# Patient Record
Sex: Female | Born: 1935
Health system: Southern US, Community
[De-identification: ages and names within clinical notes are randomized; demographics above are authoritative.]

## PROBLEM LIST (undated history)

## (undated) DIAGNOSIS — I10 Essential (primary) hypertension: Secondary | ICD-10-CM

## (undated) DIAGNOSIS — Z87442 Personal history of urinary calculi: Secondary | ICD-10-CM

## (undated) DIAGNOSIS — E785 Hyperlipidemia, unspecified: Secondary | ICD-10-CM

## (undated) DIAGNOSIS — E039 Hypothyroidism, unspecified: Secondary | ICD-10-CM

## (undated) DIAGNOSIS — I82409 Acute embolism and thrombosis of unspecified deep veins of unspecified lower extremity: Secondary | ICD-10-CM

## (undated) DIAGNOSIS — M810 Age-related osteoporosis without current pathological fracture: Secondary | ICD-10-CM

## (undated) DIAGNOSIS — E049 Nontoxic goiter, unspecified: Secondary | ICD-10-CM

## (undated) DIAGNOSIS — M1990 Unspecified osteoarthritis, unspecified site: Secondary | ICD-10-CM

## (undated) HISTORY — DX: Acute embolism and thrombosis of unspecified deep veins of unspecified lower extremity: I82.409

## (undated) HISTORY — DX: Unspecified osteoarthritis, unspecified site: M19.90

## (undated) HISTORY — DX: Personal history of urinary calculi: Z87.442

## (undated) HISTORY — DX: Age-related osteoporosis without current pathological fracture: M81.0

## (undated) HISTORY — DX: Nontoxic goiter, unspecified: E04.9

## (undated) HISTORY — DX: Hyperlipidemia, unspecified: E78.5

## (undated) HISTORY — PX: HIP SURGERY: SHX245

## (undated) HISTORY — DX: Essential (primary) hypertension: I10

## (undated) HISTORY — DX: Hypothyroidism, unspecified: E03.9

---

## 1998-05-09 ENCOUNTER — Emergency Department (HOSPITAL_COMMUNITY): Admission: EM | Admit: 1998-05-09 | Discharge: 1998-05-09 | Payer: Self-pay | Admitting: Emergency Medicine

## 1998-12-06 ENCOUNTER — Ambulatory Visit (HOSPITAL_COMMUNITY): Admission: RE | Admit: 1998-12-06 | Discharge: 1998-12-06 | Payer: Self-pay | Admitting: Internal Medicine

## 1999-12-21 ENCOUNTER — Other Ambulatory Visit: Admission: RE | Admit: 1999-12-21 | Discharge: 1999-12-21 | Payer: Self-pay | Admitting: Family Medicine

## 1999-12-31 ENCOUNTER — Ambulatory Visit (HOSPITAL_COMMUNITY): Admission: RE | Admit: 1999-12-31 | Discharge: 1999-12-31 | Payer: Self-pay | Admitting: Family Medicine

## 1999-12-31 ENCOUNTER — Encounter: Payer: Self-pay | Admitting: Family Medicine

## 2001-05-03 ENCOUNTER — Ambulatory Visit (HOSPITAL_COMMUNITY): Admission: RE | Admit: 2001-05-03 | Discharge: 2001-05-03 | Payer: Self-pay | Admitting: Family Medicine

## 2001-05-03 ENCOUNTER — Encounter: Payer: Self-pay | Admitting: Family Medicine

## 2001-06-21 ENCOUNTER — Encounter: Admission: RE | Admit: 2001-06-21 | Discharge: 2001-09-19 | Payer: Self-pay | Admitting: Family Medicine

## 2001-06-22 ENCOUNTER — Encounter: Admission: RE | Admit: 2001-06-22 | Discharge: 2001-09-20 | Payer: Self-pay | Admitting: Family Medicine

## 2002-06-12 ENCOUNTER — Other Ambulatory Visit: Admission: RE | Admit: 2002-06-12 | Discharge: 2002-06-12 | Payer: Self-pay | Admitting: Family Medicine

## 2004-06-26 ENCOUNTER — Ambulatory Visit (HOSPITAL_COMMUNITY): Admission: RE | Admit: 2004-06-26 | Discharge: 2004-06-26 | Payer: Self-pay | Admitting: Family Medicine

## 2004-11-21 ENCOUNTER — Ambulatory Visit (HOSPITAL_COMMUNITY): Admission: RE | Admit: 2004-11-21 | Discharge: 2004-11-21 | Payer: Self-pay | Admitting: Nephrology

## 2005-05-17 ENCOUNTER — Encounter: Admission: RE | Admit: 2005-05-17 | Discharge: 2005-07-01 | Payer: Self-pay | Admitting: Nephrology

## 2012-05-25 LAB — BASIC METABOLIC PANEL
BUN: 26 mg/dL — AB (ref 4–21)
Creatinine: 1.1 mg/dL (ref 0.5–1.1)
Glucose: 93 mg/dL
Potassium: 5.1 mmol/L (ref 3.4–5.3)
Sodium: 143 mmol/L (ref 137–147)

## 2012-05-25 LAB — LIPID PANEL
Cholesterol: 149 mg/dL (ref 0–200)
HDL: 58 mg/dL (ref 35–70)
LDL Cholesterol: 79 mg/dL
Triglycerides: 59 mg/dL (ref 40–160)

## 2012-05-25 LAB — CBC AND DIFFERENTIAL
HCT: 40 % (ref 36–46)
Hemoglobin: 12.7 g/dL (ref 12.0–16.0)
Platelets: 223 10*3/uL (ref 150–399)
WBC: 7.2 10^3/mL

## 2012-05-25 LAB — HEPATIC FUNCTION PANEL
ALT: 12 U/L (ref 7–35)
AST: 18 U/L (ref 13–35)
Alkaline Phosphatase: 114 U/L (ref 25–125)
Bilirubin, Total: 1.2 mg/dL

## 2012-05-25 LAB — HEMOGLOBIN A1C: Hgb A1c MFr Bld: 5.7 % (ref 4.0–6.0)

## 2012-11-02 ENCOUNTER — Telehealth: Payer: Self-pay | Admitting: Internal Medicine

## 2012-11-02 NOTE — Telephone Encounter (Signed)
rec'd from Dr. Jeri Cos forward 20 pages to Dr. Felicity Coyer 11/22/12

## 2012-12-29 ENCOUNTER — Other Ambulatory Visit (INDEPENDENT_AMBULATORY_CARE_PROVIDER_SITE_OTHER): Payer: Medicare HMO

## 2012-12-29 ENCOUNTER — Encounter: Payer: Self-pay | Admitting: Internal Medicine

## 2012-12-29 ENCOUNTER — Ambulatory Visit (INDEPENDENT_AMBULATORY_CARE_PROVIDER_SITE_OTHER): Payer: Medicare HMO | Admitting: Internal Medicine

## 2012-12-29 VITALS — BP 142/70 | HR 71 | Temp 98.4°F | Ht 62.25 in | Wt 281.2 lb

## 2012-12-29 DIAGNOSIS — R5381 Other malaise: Secondary | ICD-10-CM

## 2012-12-29 DIAGNOSIS — M5416 Radiculopathy, lumbar region: Secondary | ICD-10-CM

## 2012-12-29 DIAGNOSIS — R5383 Other fatigue: Secondary | ICD-10-CM

## 2012-12-29 DIAGNOSIS — I10 Essential (primary) hypertension: Secondary | ICD-10-CM

## 2012-12-29 DIAGNOSIS — Z1239 Encounter for other screening for malignant neoplasm of breast: Secondary | ICD-10-CM

## 2012-12-29 DIAGNOSIS — G8929 Other chronic pain: Secondary | ICD-10-CM

## 2012-12-29 DIAGNOSIS — M1710 Unilateral primary osteoarthritis, unspecified knee: Secondary | ICD-10-CM

## 2012-12-29 DIAGNOSIS — M1711 Unilateral primary osteoarthritis, right knee: Secondary | ICD-10-CM

## 2012-12-29 DIAGNOSIS — E785 Hyperlipidemia, unspecified: Secondary | ICD-10-CM

## 2012-12-29 DIAGNOSIS — IMO0002 Reserved for concepts with insufficient information to code with codable children: Secondary | ICD-10-CM

## 2012-12-29 LAB — HEPATIC FUNCTION PANEL
ALT: 14 U/L (ref 0–35)
AST: 16 U/L (ref 0–37)
Alkaline Phosphatase: 95 U/L (ref 39–117)
Bilirubin, Direct: 0.3 mg/dL (ref 0.0–0.3)
Total Bilirubin: 1.3 mg/dL — ABNORMAL HIGH (ref 0.3–1.2)

## 2012-12-29 LAB — HM COLONOSCOPY

## 2012-12-29 LAB — CBC WITH DIFFERENTIAL/PLATELET
Basophils Absolute: 0 10*3/uL (ref 0.0–0.1)
Eosinophils Absolute: 0.3 10*3/uL (ref 0.0–0.7)
Eosinophils Relative: 4.5 % (ref 0.0–5.0)
HCT: 35.5 % — ABNORMAL LOW (ref 36.0–46.0)
Lymphs Abs: 1.6 10*3/uL (ref 0.7–4.0)
MCHC: 32.6 g/dL (ref 30.0–36.0)
MCV: 87.9 fl (ref 78.0–100.0)
Monocytes Absolute: 0.4 10*3/uL (ref 0.1–1.0)
Neutrophils Relative %: 61.5 % (ref 43.0–77.0)
Platelets: 155 10*3/uL (ref 150.0–400.0)
RDW: 15.3 % — ABNORMAL HIGH (ref 11.5–14.6)

## 2012-12-29 LAB — LIPID PANEL
Cholesterol: 140 mg/dL (ref 0–200)
LDL Cholesterol: 64 mg/dL (ref 0–99)
Total CHOL/HDL Ratio: 2
Triglycerides: 61 mg/dL (ref 0.0–149.0)

## 2012-12-29 LAB — TSH: TSH: 0.07 u[IU]/mL — ABNORMAL LOW (ref 0.35–5.50)

## 2012-12-29 LAB — BASIC METABOLIC PANEL
CO2: 25 mEq/L (ref 19–32)
Chloride: 106 mEq/L (ref 96–112)
Potassium: 4.1 mEq/L (ref 3.5–5.1)

## 2012-12-29 MED ORDER — METOPROLOL SUCCINATE ER 100 MG PO TB24: 100.0000 mg | ORAL_TABLET | Freq: Every day | ORAL | Status: DC

## 2012-12-29 NOTE — Patient Instructions (Signed)
It was good to see you today. We have reviewed your prior records including labs and tests today Test(s) ordered today. Your results will be released to MyChart (or called to you) after review, usually within 72hours after test completion. If any changes need to be made, you will be notified at that same time. Medications reviewed and updated, no changes recommended at this time. Refill on medication(s) as discussed today. we'll make referral to orthopedics for your knee and back pain symptoms. Also for mammogram screening. Our office will contact you regarding appointment(s) once made. Please schedule followup in 3-4 months, call sooner if problems.

## 2012-12-29 NOTE — Progress Notes (Signed)
Subjective:    Patient ID: Shawna Fuentes, female    DOB: [birth year], 79 y.o.   MRN: [id number]  HPI  New patient to me and our practice, here today to establish a primary care provider. Previously followed with Dr. Jeri Cos  Reviewed chronic medical issues Hypertension - out of medications for past month. Previously reports compliance with medications as prescribed. No chest pain, edema, headache or vision change. No history of CAD or stroke  Dyslipidemia. Prescribed and compliant with statin therapy. Denies complication related to prescribe therapy.  Osteoarthritis - takes Tylenol #3 in AM occ/prn for control of pain and anti-inflammatory twice a day. Currently denies joint swelling or acute flare -chronically impaired gait because of knee pain and "weakness" -uses a rolling walker at all times within the house and when out, uses wheelchair if available  Past Medical History  Diagnosis Date  . Arthritis   . Hyperlipidemia   . Hypertension   . History of kidney stones    Family History  Problem Relation Age of Onset  . Osteoarthritis Mother   . Hypertension Mother   . Hypertension Father    History  Substance Use Topics  . Smoking status: Never Smoker   . Smokeless tobacco: Not on file  . Alcohol Use: No    Review of Systems Constitutional: Negative for fever or weight change.  Respiratory: Negative for cough and shortness of breath.   Cardiovascular: Negative for chest pain or palpitations.  Gastrointestinal: Negative for abdominal pain, no bowel changes.  Musculoskeletal: Negative for joint swelling. see HPI Skin: Negative for rash.  Neurological: Negative for dizziness or headache.  No other specific complaints in a complete review of systems (except as listed in HPI above).     Objective:   Physical Exam BP 142/70  Pulse 71  Temp(Src) 98.4 F (36.9 C) (Oral)  Ht 5' 2.25" (1.581 m)  Wt 281 lb 3.2 oz (127.551 kg)  BMI 51.03 kg/m2  SpO2 97% Wt Readings  from Last 3 Encounters:  12/29/12 281 lb 3.2 oz (127.551 kg)   Constitutional: She is obese, sitting in WC; appears well-developed and well-nourished. No distress. Dtr at side HENT: Head: Normocephalic and atraumatic. Ears: B TMs ok, no erythema or effusion; Nose: Nose normal. Mouth/Throat: Oropharynx is clear and moist. No oropharyngeal exudate.  Eyes: Conjunctivae and EOM are normal. Pupils are equal, round, and reactive to light. No scleral icterus.  Neck: Thick, Normal range of motion. Neck supple. No JVD present. No thyromegaly present.  Cardiovascular: Normal rate, regular rhythm and normal heart sounds.  No murmur heard. fatty BLE, but no BLE edema. Pulmonary/Chest: Effort normal and breath sounds normal. No respiratory distress. She has no wheezes.  Abdominal: Soft. Bowel sounds are normal. She exhibits no distension. There is no tenderness. no masses Musculoskeletal: R knee - boggy synovitis - tender to palpation over joint line; FROM and ligamentous function intact. Back: full range of motion of thoracic and lumbar spine. Non tender to palpation. Negative straight leg raise. DTR's are symmetrically intact. Sensation intact in all dermatomes of the lower extremities. Full strength to manual muscle testing. patient is not able to heel toe walk due to favoring RLE and ambulates with antalgic gait, holding wall/exam table for support (RW in waiting room). Neurological: She is alert and oriented to person, place, and time. No cranial nerve deficit. Coordination, and speech are normal. balance/gait impaired because of orthopedic issues, see above Skin: Skin is warm and dry. No rash noted. No  erythema.  Psychiatric: She has a normal mood and affect. Her behavior is normal. Judgment and thought content normal.   Lab Results  Component Value Date   WBC 7.2 05/25/2012   HGB 12.7 05/25/2012   HCT 40 05/25/2012   PLT 223 05/25/2012   CHOL 149 05/25/2012   TRIG 59 05/25/2012   HDL 58 05/25/2012    LDLCALC 79 05/25/2012   ALT 12 05/25/2012   AST 18 05/25/2012   NA 143 05/25/2012   K 5.1 05/25/2012   CREATININE 1.1 05/25/2012   BUN 26* 05/25/2012   HGBA1C 5.7 05/25/2012       Assessment & Plan:   See problem list. Medications and labs reviewed today.  Fatigue - nonspecific symptoms/exam - check screening labs  Time spent with pt/family today 45 minutes, greater than 50% time spent counseling patient on hypertension, lipids, osteoarthritis and medication review. Also review of prior records and ROI request  Refer for mammo, declines colo screening

## 2012-12-30 ENCOUNTER — Encounter: Payer: Self-pay | Admitting: Internal Medicine

## 2012-12-30 DIAGNOSIS — M1711 Unilateral primary osteoarthritis, right knee: Secondary | ICD-10-CM | POA: Insufficient documentation

## 2012-12-30 DIAGNOSIS — G8929 Other chronic pain: Secondary | ICD-10-CM | POA: Insufficient documentation

## 2012-12-30 NOTE — Assessment & Plan Note (Signed)
Reports remote rheumatologic and orthopedic evaluation for same Previously advised on need for total joint replacement, placed on hold until weight loss could be achieved Given progressive symptoms and impaired gait because of same, refer back to orthopedics for an evaluation (last seen >8years ago) Continue scheduled anti-inflammatory and use of Tylenol No. 3 when needed (#30 rx'd 05/2012 without refill since)

## 2012-12-30 NOTE — Assessment & Plan Note (Signed)
BP Readings from Last 3 Encounters:  12/29/12 142/70   Reports generally controlled when compliant with medications as prescribed Refill on beta blocker, continue ARB and diuretic today

## 2012-12-30 NOTE — Assessment & Plan Note (Signed)
Reports prior back pain, improved in past 2-3 years Gait would suggest chronic neurogenic claudication vs OA Referral to orthopedics for evaluation of same Continue scheduled anti-inflammatory with when necessary Tylenol #3

## 2012-12-30 NOTE — Assessment & Plan Note (Signed)
On statin Check annually, titrate as needed 

## 2013-01-29 ENCOUNTER — Telehealth: Payer: Self-pay | Admitting: *Deleted

## 2013-01-29 DIAGNOSIS — G8929 Other chronic pain: Secondary | ICD-10-CM

## 2013-01-29 DIAGNOSIS — IMO0002 Reserved for concepts with insufficient information to code with codable children: Secondary | ICD-10-CM

## 2013-01-29 DIAGNOSIS — M1710 Unilateral primary osteoarthritis, unspecified knee: Secondary | ICD-10-CM

## 2013-01-29 DIAGNOSIS — M1711 Unilateral primary osteoarthritis, right knee: Secondary | ICD-10-CM

## 2013-01-29 DIAGNOSIS — M5416 Radiculopathy, lumbar region: Secondary | ICD-10-CM

## 2013-01-29 NOTE — Telephone Encounter (Signed)
Pt called requesting a Rx for Wheel chair.  Pt states she is leaving for a funeral on Thursday and would like a wheelchair to help with mobility.  Please advise

## 2013-01-29 NOTE — Telephone Encounter (Signed)
Ok to generate rx and i will sign - ICD9 dx codes: 724.4, 338.29, 715.96

## 2013-01-30 NOTE — Telephone Encounter (Signed)
Spoke with pt. Advised Rx ready

## 2013-04-02 ENCOUNTER — Ambulatory Visit: Payer: Medicare HMO | Admitting: Internal Medicine

## 2013-04-13 ENCOUNTER — Ambulatory Visit: Payer: Medicare HMO | Admitting: Internal Medicine

## 2013-04-20 ENCOUNTER — Ambulatory Visit (INDEPENDENT_AMBULATORY_CARE_PROVIDER_SITE_OTHER): Payer: Medicare HMO | Admitting: Internal Medicine

## 2013-04-20 ENCOUNTER — Other Ambulatory Visit (INDEPENDENT_AMBULATORY_CARE_PROVIDER_SITE_OTHER): Payer: Medicare HMO

## 2013-04-20 ENCOUNTER — Encounter: Payer: Self-pay | Admitting: Internal Medicine

## 2013-04-20 VITALS — BP 134/72 | HR 68 | Temp 98.0°F

## 2013-04-20 DIAGNOSIS — I10 Essential (primary) hypertension: Secondary | ICD-10-CM

## 2013-04-20 DIAGNOSIS — Z23 Encounter for immunization: Secondary | ICD-10-CM

## 2013-04-20 DIAGNOSIS — M1710 Unilateral primary osteoarthritis, unspecified knee: Secondary | ICD-10-CM

## 2013-04-20 DIAGNOSIS — R6889 Other general symptoms and signs: Secondary | ICD-10-CM

## 2013-04-20 DIAGNOSIS — E039 Hypothyroidism, unspecified: Secondary | ICD-10-CM | POA: Insufficient documentation

## 2013-04-20 DIAGNOSIS — M1711 Unilateral primary osteoarthritis, right knee: Secondary | ICD-10-CM

## 2013-04-20 DIAGNOSIS — R7989 Other specified abnormal findings of blood chemistry: Secondary | ICD-10-CM

## 2013-04-20 LAB — TSH: TSH: 0.07 u[IU]/mL — ABNORMAL LOW (ref 0.35–5.50)

## 2013-04-20 LAB — T3: T3, Total: 100.6 ng/dL (ref 80.0–204.0)

## 2013-04-20 MED ORDER — ACETAMINOPHEN-CODEINE #2 300-15 MG PO TABS: 1.0000 | ORAL_TABLET | Freq: Four times a day (QID) | ORAL | Status: DC | PRN

## 2013-04-20 NOTE — Assessment & Plan Note (Signed)
BP Readings from Last 3 Encounters:  04/20/13 152/82  12/29/12 142/70   Reports generally controlled when compliant with medications as prescribed The current medical regimen is effective;  continue present plan and medications.

## 2013-04-20 NOTE — Assessment & Plan Note (Signed)
No symptoms of hyperthyroidism, but recheck TFTs today Consider referral to Endo if remains abnormal

## 2013-04-20 NOTE — Progress Notes (Signed)
Pre-visit discussion using our clinic review tool. No additional management support is needed unless otherwise documented below in the visit note.  

## 2013-04-20 NOTE — Progress Notes (Signed)
  Subjective:    Patient ID: Shawna Fuentes, female    DOB: Feb 04, 1933, 79 y.o.   MRN: [id number]  HPI  Here for follow up - reviewed chronic medical issues  Hypertension -reports compliance with medications as prescribed. No chest pain, edema, headache or vision change. No history of CAD or stroke  Dyslipidemia. Prescribed and compliant with statin therapy. Denies complication related to prescribed therapy.  Osteoarthritis - takes Tylenol #3 in AM occ/prn for control of pain (but causes sedation and constipation) - also anti-inflammatory twice a day. Currently denies joint swelling or acute flare -chronically impaired gait because of knee pain and "weakness" -uses a rolling walker at all times within the house and when out, uses wheelchair if available  Past Medical History  Diagnosis Date  . Arthritis   . Hyperlipidemia   . Hypertension   . History of kidney stones     Review of Systems  Respiratory: Negative for cough and shortness of breath.   Cardiovascular: Negative for chest pain and leg swelling.  Musculoskeletal: Positive for gait problem and joint swelling.        Objective:   Physical Exam BP 152/82  Pulse 68  Temp(Src) 98 F (36.7 C) (Oral)  SpO2 96% Wt Readings from Last 3 Encounters:  12/29/12 281 lb 3.2 oz (127.551 kg)   Constitutional: She is obese, sitting in WC; appears well-developed and well-nourished. No distress. Son at side Neck: Thick, Normal range of motion. Neck supple. No JVD present. No thyromegaly present.  Cardiovascular: Normal rate, regular rhythm and normal heart sounds.  No murmur heard. fatty BLE, but no BLE edema. Pulmonary/Chest: Effort normal and breath sounds normal. No respiratory distress. She has no wheezes. Musculoskeletal: R knee - boggy synovitis - tender to palpation over joint line; FROM and ligamentous function intact.  Neurological: She is alert and oriented to person, place, and time. No cranial nerve deficit. Coordination,  and speech are normal. balance/gait not tested today Skin: Skin is warm and dry. No rash noted. No erythema.  Psychiatric: She has a normal mood and affect. Her behavior is normal. Judgment and thought content normal.   Lab Results  Component Value Date   WBC 6.1 12/29/2012   HGB 11.6* 12/29/2012   HCT 35.5* 12/29/2012   PLT 155.0 12/29/2012   GLUCOSE 100* 12/29/2012   CHOL 140 12/29/2012   TRIG 61.0 12/29/2012   HDL 63.80 12/29/2012   LDLCALC 64 12/29/2012   ALT 14 12/29/2012   AST 16 12/29/2012   NA 141 12/29/2012   K 4.1 12/29/2012   CL 106 12/29/2012   CREATININE 0.8 12/29/2012   BUN 21 12/29/2012   CO2 25 12/29/2012   TSH 0.07* 12/29/2012   HGBA1C 5.7 05/25/2012       Assessment & Plan:   See problem list. Medications and labs reviewed today.

## 2013-04-20 NOTE — Patient Instructions (Addendum)
It was good to see you today.  Your annual flu shot was given and/or updated today.  We have reviewed your prior records including labs and tests today  Test(s) ordered today. Your results will be released to MyChart (or called to you) after review, usually within 72hours after test completion. If any changes need to be made, you will be notified at that same time.  Medications reviewed and updated Change Tylenol #3 to Tylenol #2 for pain as needed - No other changes recommended at this time  Your prescription(s) have been given to you to submit to your pharmacy. Please take as directed and contact our office if you believe you are having problem(s) with the medication(s).  Please schedule followup in 6 months, call sooner if problems.

## 2013-04-20 NOTE — Assessment & Plan Note (Addendum)
Reports remote rheumatologic and orthopedic evaluation for same Did not keep ortho appt 12/2012 as referred Previously advised on need for total joint replacement, but placed on hold until weight loss achieved Continue scheduled anti-inflammatory and use of Tylenol with codiene when needed  Will try Tylenol #2 in place of #3 to reduce codiene side effects of sedation and constipation

## 2013-06-23 ENCOUNTER — Other Ambulatory Visit: Payer: Self-pay | Admitting: Internal Medicine

## 2013-08-03 ENCOUNTER — Other Ambulatory Visit: Payer: Self-pay | Admitting: Internal Medicine

## 2013-08-11 ENCOUNTER — Other Ambulatory Visit: Payer: Self-pay | Admitting: Internal Medicine

## 2013-08-31 ENCOUNTER — Other Ambulatory Visit: Payer: Self-pay | Admitting: Internal Medicine

## 2013-09-13 ENCOUNTER — Other Ambulatory Visit: Payer: Self-pay | Admitting: Internal Medicine

## 2013-09-17 ENCOUNTER — Other Ambulatory Visit (INDEPENDENT_AMBULATORY_CARE_PROVIDER_SITE_OTHER): Payer: Medicare HMO

## 2013-09-17 ENCOUNTER — Ambulatory Visit (INDEPENDENT_AMBULATORY_CARE_PROVIDER_SITE_OTHER): Payer: Medicaid Other | Admitting: Internal Medicine

## 2013-09-17 ENCOUNTER — Encounter: Payer: Self-pay | Admitting: Internal Medicine

## 2013-09-17 VITALS — BP 140/60 | HR 74 | Temp 98.4°F | Resp 15

## 2013-09-17 DIAGNOSIS — R2689 Other abnormalities of gait and mobility: Secondary | ICD-10-CM

## 2013-09-17 DIAGNOSIS — M533 Sacrococcygeal disorders, not elsewhere classified: Secondary | ICD-10-CM

## 2013-09-17 DIAGNOSIS — R269 Unspecified abnormalities of gait and mobility: Secondary | ICD-10-CM

## 2013-09-17 DIAGNOSIS — M25552 Pain in left hip: Secondary | ICD-10-CM

## 2013-09-17 DIAGNOSIS — M545 Low back pain, unspecified: Secondary | ICD-10-CM

## 2013-09-17 DIAGNOSIS — M25551 Pain in right hip: Secondary | ICD-10-CM

## 2013-09-17 DIAGNOSIS — IMO0001 Reserved for inherently not codable concepts without codable children: Secondary | ICD-10-CM

## 2013-09-17 DIAGNOSIS — M25559 Pain in unspecified hip: Secondary | ICD-10-CM

## 2013-09-17 LAB — SEDIMENTATION RATE: Sed Rate: 57 mm/hr — ABNORMAL HIGH (ref 0–22)

## 2013-09-17 LAB — CK: CK TOTAL: 198 U/L — AB (ref 7–177)

## 2013-09-17 MED ORDER — TRAMADOL HCL 50 MG PO TABS: ORAL_TABLET | ORAL | Status: DC

## 2013-09-17 NOTE — Addendum Note (Signed)
Addended by: Harl Bowie on: 09/17/2013 02:16 PM   Modules accepted: Orders

## 2013-09-17 NOTE — Progress Notes (Signed)
   Subjective:    Patient ID: Shawna Fuentes, female    DOB: [birth year], 80 y.o.   MRN: [id number]  HPI  Her symptoms began 2 weeks ago in the lumbosacral area as well as the hips. She describes a sensation of pins & needles in the  lumbosacral area and heaviness in her legs which impairs ambulation. The symptoms can last minutes.  She has used Tylenol #2 but felt this made her sleepy. She states that Tylenol #3 did not  She is also on Clinoril generic twice a day.  She has no history of injury, surgery, or injections to lumbosacral spine  She's had some chills. She has edema of the ankles. She's also had some myalgias. She is on statin.    Review of Systems  She specifically denies fever, sweats, or Weight loss.  She has no incontinence of urine or stool  The swelling is in the ankles not in the joints.    Objective:   Physical Exam  General appearance i: morbidly obese; in W/C; w/o distress.  Eyes: No conjunctival inflammation or scleral icterus is present.  Oral exam: Dentures; lips and gums are healthy appearing.There is no oropharyngeal erythema or exudate noted.   Heart:  Normal rate and regular rhythm. S1 and S2 normal without gallop, murmur, click, rub or other extra sounds     Lungs:Chest :slight rales@ basest.No increased work of breathing.   Slight tenderness over the LS area to percussion  Musculoskeletal: Able to lie flat and sit up without help. Negative straight leg raising bilaterally; no pain with hip ROM. Crepitus R > L.  Skin:Warm & dry.  Intact without suspicious lesions or rashes                Assessment & Plan:  #1 LS & hip pain See orders

## 2013-09-17 NOTE — Patient Instructions (Addendum)
  Your next office appointment will be determined based upon review of your pending labs . Those instructions will be transmitted to you   by mail. Followup as needed for your acute issue. Please report any significant change in your symptoms.The Physical Therapy referral will be scheduled and you'll be notified of the time.

## 2013-09-17 NOTE — Progress Notes (Signed)
Pre visit review using our clinic review tool, if applicable. No additional management support is needed unless otherwise documented below in the visit note. 

## 2013-09-18 ENCOUNTER — Encounter: Payer: Self-pay | Admitting: *Deleted

## 2013-09-22 ENCOUNTER — Emergency Department (HOSPITAL_COMMUNITY): Payer: Medicare HMO

## 2013-09-22 ENCOUNTER — Encounter (HOSPITAL_COMMUNITY)
Admission: EM | Disposition: A | Discharge status: Skilled Nursing Facility | Payer: Self-pay | Source: Home / Self Care | Attending: Orthopedic Surgery

## 2013-09-22 ENCOUNTER — Encounter (HOSPITAL_COMMUNITY): Payer: Medicare HMO | Admitting: Anesthesiology

## 2013-09-22 ENCOUNTER — Inpatient Hospital Stay (HOSPITAL_COMMUNITY)
Admission: EM | Admit: 2013-09-22 | Discharge: 2013-09-25 | DRG: 481 | Disposition: A | Discharge status: Skilled Nursing Facility | Payer: Medicare HMO | Attending: Orthopedic Surgery | Admitting: Orthopedic Surgery

## 2013-09-22 ENCOUNTER — Emergency Department (HOSPITAL_COMMUNITY): Payer: Medicare HMO | Admitting: Anesthesiology

## 2013-09-22 ENCOUNTER — Encounter (HOSPITAL_COMMUNITY): Payer: Self-pay | Admitting: Emergency Medicine

## 2013-09-22 DIAGNOSIS — R262 Difficulty in walking, not elsewhere classified: Secondary | ICD-10-CM | POA: Diagnosis present

## 2013-09-22 DIAGNOSIS — M1711 Unilateral primary osteoarthritis, right knee: Secondary | ICD-10-CM | POA: Diagnosis present

## 2013-09-22 DIAGNOSIS — Z6841 Body Mass Index (BMI) 40.0 and over, adult: Secondary | ICD-10-CM

## 2013-09-22 DIAGNOSIS — G8929 Other chronic pain: Secondary | ICD-10-CM | POA: Diagnosis present

## 2013-09-22 DIAGNOSIS — I10 Essential (primary) hypertension: Secondary | ICD-10-CM | POA: Diagnosis present

## 2013-09-22 DIAGNOSIS — R5381 Other malaise: Secondary | ICD-10-CM | POA: Diagnosis present

## 2013-09-22 DIAGNOSIS — S7223XA Displaced subtrochanteric fracture of unspecified femur, initial encounter for closed fracture: Secondary | ICD-10-CM | POA: Diagnosis present

## 2013-09-22 DIAGNOSIS — M5416 Radiculopathy, lumbar region: Secondary | ICD-10-CM

## 2013-09-22 DIAGNOSIS — M1710 Unilateral primary osteoarthritis, unspecified knee: Secondary | ICD-10-CM | POA: Diagnosis present

## 2013-09-22 DIAGNOSIS — R5383 Other fatigue: Secondary | ICD-10-CM | POA: Diagnosis present

## 2013-09-22 DIAGNOSIS — D62 Acute posthemorrhagic anemia: Secondary | ICD-10-CM

## 2013-09-22 DIAGNOSIS — W19XXXA Unspecified fall, initial encounter: Secondary | ICD-10-CM | POA: Diagnosis present

## 2013-09-22 DIAGNOSIS — S7290XA Unspecified fracture of unspecified femur, initial encounter for closed fracture: Secondary | ICD-10-CM | POA: Diagnosis present

## 2013-09-22 DIAGNOSIS — S7291XA Unspecified fracture of right femur, initial encounter for closed fracture: Secondary | ICD-10-CM

## 2013-09-22 DIAGNOSIS — N289 Disorder of kidney and ureter, unspecified: Secondary | ICD-10-CM | POA: Diagnosis present

## 2013-09-22 DIAGNOSIS — Z79899 Other long term (current) drug therapy: Secondary | ICD-10-CM

## 2013-09-22 DIAGNOSIS — E785 Hyperlipidemia, unspecified: Secondary | ICD-10-CM | POA: Diagnosis present

## 2013-09-22 DIAGNOSIS — S7221XA Displaced subtrochanteric fracture of right femur, initial encounter for closed fracture: Secondary | ICD-10-CM | POA: Diagnosis present

## 2013-09-22 HISTORY — PX: FEMUR IM NAIL: SHX1597

## 2013-09-22 LAB — URINALYSIS, ROUTINE W REFLEX MICROSCOPIC
BILIRUBIN URINE: NEGATIVE
Glucose, UA: NEGATIVE mg/dL
HGB URINE DIPSTICK: NEGATIVE
KETONES UR: NEGATIVE mg/dL
Nitrite: NEGATIVE
PROTEIN: NEGATIVE mg/dL
Specific Gravity, Urine: 1.021 (ref 1.005–1.030)
Urobilinogen, UA: 1 mg/dL (ref 0.0–1.0)
pH: 5 (ref 5.0–8.0)

## 2013-09-22 LAB — CBC WITH DIFFERENTIAL/PLATELET
Basophils Absolute: 0 10*3/uL (ref 0.0–0.1)
Basophils Relative: 0 % (ref 0–1)
Eosinophils Absolute: 0.1 10*3/uL (ref 0.0–0.7)
Eosinophils Relative: 1 % (ref 0–5)
HEMATOCRIT: 33.6 % — AB (ref 36.0–46.0)
Hemoglobin: 10.8 g/dL — ABNORMAL LOW (ref 12.0–15.0)
LYMPHS ABS: 1.4 10*3/uL (ref 0.7–4.0)
LYMPHS PCT: 14 % (ref 12–46)
MCH: 28.1 pg (ref 26.0–34.0)
MCHC: 32.1 g/dL (ref 30.0–36.0)
MCV: 87.3 fL (ref 78.0–100.0)
MONO ABS: 0.5 10*3/uL (ref 0.1–1.0)
Monocytes Relative: 5 % (ref 3–12)
Neutro Abs: 8 10*3/uL — ABNORMAL HIGH (ref 1.7–7.7)
Neutrophils Relative %: 80 % — ABNORMAL HIGH (ref 43–77)
PLATELETS: 180 10*3/uL (ref 150–400)
RBC: 3.85 MIL/uL — AB (ref 3.87–5.11)
RDW: 15 % (ref 11.5–15.5)
WBC: 10 10*3/uL (ref 4.0–10.5)

## 2013-09-22 LAB — PREPARE RBC (CROSSMATCH)

## 2013-09-22 LAB — GLUCOSE, CAPILLARY
Glucose-Capillary: 146 mg/dL — ABNORMAL HIGH (ref 70–99)
Glucose-Capillary: 119 mg/dL — ABNORMAL HIGH (ref 70–99)

## 2013-09-22 LAB — BASIC METABOLIC PANEL
BUN: 35 mg/dL — ABNORMAL HIGH (ref 6–23)
CO2: 26 meq/L (ref 19–32)
Calcium: 9.8 mg/dL (ref 8.4–10.5)
Chloride: 102 mEq/L (ref 96–112)
Creatinine, Ser: 0.86 mg/dL (ref 0.50–1.10)
GFR calc Af Amer: 73 mL/min — ABNORMAL LOW (ref >90)
GFR calc non Af Amer: 63 mL/min — ABNORMAL LOW (ref >90)
GLUCOSE: 112 mg/dL — AB (ref 70–99)
Potassium: 4.2 mEq/L (ref 3.7–5.3)
SODIUM: 142 meq/L (ref 137–147)

## 2013-09-22 LAB — POCT I-STAT 4, (NA,K, GLUC, HGB,HCT)
GLUCOSE: 157 mg/dL — AB (ref 70–99)
Glucose, Bld: 175 mg/dL — ABNORMAL HIGH (ref 70–99)
HCT: 19 % — ABNORMAL LOW (ref 36.0–46.0)
HEMATOCRIT: 26 % — AB (ref 36.0–46.0)
HEMOGLOBIN: 8.8 g/dL — AB (ref 12.0–15.0)
Hemoglobin: 6.5 g/dL — CL (ref 12.0–15.0)
POTASSIUM: 3.7 meq/L (ref 3.7–5.3)
Potassium: 3.9 mEq/L (ref 3.7–5.3)
Sodium: 139 mEq/L (ref 137–147)
Sodium: 139 mEq/L (ref 137–147)

## 2013-09-22 LAB — URINE MICROSCOPIC-ADD ON

## 2013-09-22 LAB — ABO/RH: ABO/RH(D): B POS

## 2013-09-22 SURGERY — INSERTION, INTRAMEDULLARY ROD, FEMUR
Anesthesia: General | Laterality: Right

## 2013-09-22 MED ORDER — ACETAMINOPHEN 500 MG PO TABS
1000.0000 mg | ORAL_TABLET | Freq: Four times a day (QID) | ORAL | Status: AC
  Administered 2013-09-23 (×3): 1000 mg via ORAL
  Filled 2013-09-22 (×4): qty 2

## 2013-09-22 MED ORDER — ATORVASTATIN CALCIUM 10 MG PO TABS
10.0000 mg | ORAL_TABLET | Freq: Every evening | ORAL | Status: DC
  Administered 2013-09-22 – 2013-09-24 (×3): 10 mg via ORAL
  Filled 2013-09-22 (×4): qty 1

## 2013-09-22 MED ORDER — ONDANSETRON HCL 4 MG PO TABS
4.0000 mg | ORAL_TABLET | Freq: Four times a day (QID) | ORAL | Status: DC | PRN
  Filled 2013-09-22: qty 1

## 2013-09-22 MED ORDER — IRBESARTAN 150 MG PO TABS
150.0000 mg | ORAL_TABLET | Freq: Every day | ORAL | Status: DC
  Administered 2013-09-23: 150 mg via ORAL
  Filled 2013-09-22 (×3): qty 1

## 2013-09-22 MED ORDER — ALBUMIN HUMAN 5 % IV SOLN
INTRAVENOUS | Status: DC | PRN
  Administered 2013-09-22 (×2): via INTRAVENOUS

## 2013-09-22 MED ORDER — SUCCINYLCHOLINE CHLORIDE 20 MG/ML IJ SOLN
INTRAMUSCULAR | Status: DC | PRN
  Administered 2013-09-22: 100 mg via INTRAVENOUS

## 2013-09-22 MED ORDER — HYDROCHLOROTHIAZIDE 25 MG PO TABS
25.0000 mg | ORAL_TABLET | Freq: Every day | ORAL | Status: DC
  Administered 2013-09-23: 25 mg via ORAL
  Filled 2013-09-22 (×3): qty 1

## 2013-09-22 MED ORDER — GLYCOPYRROLATE 0.2 MG/ML IJ SOLN
INTRAMUSCULAR | Status: AC
  Filled 2013-09-22: qty 2

## 2013-09-22 MED ORDER — PROPRANOLOL HCL 1 MG/ML IV SOLN
INTRAVENOUS | Status: DC | PRN
  Administered 2013-09-22: .25 mg via INTRAVENOUS

## 2013-09-22 MED ORDER — ONDANSETRON HCL 4 MG/2ML IJ SOLN: 4.0000 mg | Freq: Four times a day (QID) | INTRAMUSCULAR | Status: DC | PRN

## 2013-09-22 MED ORDER — SENNA 8.6 MG PO TABS
2.0000 | ORAL_TABLET | Freq: Two times a day (BID) | ORAL | Status: DC
  Administered 2013-09-22 – 2013-09-25 (×6): 17.2 mg via ORAL
  Filled 2013-09-22 (×7): qty 2

## 2013-09-22 MED ORDER — LIDOCAINE HCL 4 % MT SOLN
OROMUCOSAL | Status: DC | PRN
  Administered 2013-09-22: 5 mL via TOPICAL

## 2013-09-22 MED ORDER — PHENOL 1.4 % MT LIQD: 1.0000 | OROMUCOSAL | Status: DC | PRN

## 2013-09-22 MED ORDER — METHOCARBAMOL 500 MG PO TABS
ORAL_TABLET | ORAL | Status: AC
  Administered 2013-09-22: 500 mg via ORAL
  Filled 2013-09-22: qty 1

## 2013-09-22 MED ORDER — CALCIUM CHLORIDE 10 % IV SOLN
INTRAVENOUS | Status: DC | PRN
  Administered 2013-09-22: 200 mg via INTRAVENOUS
  Administered 2013-09-22: 100 mg via INTRAVENOUS
  Administered 2013-09-22: 400 mg via INTRAVENOUS
  Administered 2013-09-22 (×5): 100 mg via INTRAVENOUS
  Administered 2013-09-22: 200 mg via INTRAVENOUS

## 2013-09-22 MED ORDER — FERROUS SULFATE 325 (65 FE) MG PO TABS
325.0000 mg | ORAL_TABLET | Freq: Three times a day (TID) | ORAL | Status: DC
  Administered 2013-09-23 – 2013-09-25 (×6): 325 mg via ORAL
  Filled 2013-09-22 (×10): qty 1

## 2013-09-22 MED ORDER — FENTANYL CITRATE 0.05 MG/ML IJ SOLN
INTRAMUSCULAR | Status: AC
  Filled 2013-09-22: qty 5

## 2013-09-22 MED ORDER — VECURONIUM BROMIDE 10 MG IV SOLR
INTRAVENOUS | Status: DC | PRN
  Administered 2013-09-22: 5 mg via INTRAVENOUS
  Administered 2013-09-22: 3 mg via INTRAVENOUS

## 2013-09-22 MED ORDER — LACTATED RINGERS IV SOLN
INTRAVENOUS | Status: DC | PRN
  Administered 2013-09-22 (×4): via INTRAVENOUS

## 2013-09-22 MED ORDER — FENTANYL CITRATE 0.05 MG/ML IJ SOLN
INTRAMUSCULAR | Status: DC | PRN
  Administered 2013-09-22: 100 ug via INTRAVENOUS
  Administered 2013-09-22: 50 ug via INTRAVENOUS
  Administered 2013-09-22 (×4): 25 ug via INTRAVENOUS
  Administered 2013-09-22: 150 ug via INTRAVENOUS
  Administered 2013-09-22: 25 ug via INTRAVENOUS
  Administered 2013-09-22: 75 ug via INTRAVENOUS

## 2013-09-22 MED ORDER — MAGNESIUM CITRATE PO SOLN: 1.0000 | Freq: Once | ORAL | Status: AC | PRN

## 2013-09-22 MED ORDER — INSULIN ASPART 100 UNIT/ML ~~LOC~~ SOLN
4.0000 [IU] | Freq: Three times a day (TID) | SUBCUTANEOUS | Status: DC
  Administered 2013-09-23 (×2): 4 [IU] via SUBCUTANEOUS

## 2013-09-22 MED ORDER — OXYCODONE HCL 5 MG PO TABS
ORAL_TABLET | ORAL | Status: AC
  Administered 2013-09-22: 10 mg via ORAL
  Filled 2013-09-22: qty 2

## 2013-09-22 MED ORDER — CEFAZOLIN SODIUM-DEXTROSE 2-3 GM-% IV SOLR
INTRAVENOUS | Status: AC
  Administered 2013-09-22 (×2): 2 g via INTRAVENOUS
  Filled 2013-09-22: qty 50

## 2013-09-22 MED ORDER — MENTHOL 3 MG MT LOZG: 1.0000 | LOZENGE | OROMUCOSAL | Status: DC | PRN

## 2013-09-22 MED ORDER — MORPHINE SULFATE 4 MG/ML IJ SOLN
4.0000 mg | Freq: Once | INTRAMUSCULAR | Status: AC
  Administered 2013-09-22: 4 mg via INTRAVENOUS
  Filled 2013-09-22: qty 1

## 2013-09-22 MED ORDER — INSULIN ASPART 100 UNIT/ML ~~LOC~~ SOLN
0.0000 [IU] | Freq: Three times a day (TID) | SUBCUTANEOUS | Status: DC
  Administered 2013-09-23: 4 [IU] via SUBCUTANEOUS
  Filled 2013-09-22 (×27): qty 0.09

## 2013-09-22 MED ORDER — BISACODYL 10 MG RE SUPP: 10.0000 mg | Freq: Every day | RECTAL | Status: DC | PRN

## 2013-09-22 MED ORDER — ONDANSETRON HCL 4 MG/2ML IJ SOLN: 4.0000 mg | Freq: Once | INTRAMUSCULAR | Status: DC | PRN

## 2013-09-22 MED ORDER — CEFAZOLIN SODIUM 1-5 GM-% IV SOLN
INTRAVENOUS | Status: AC
  Administered 2013-09-22 (×2): 1 g via INTRAVENOUS
  Filled 2013-09-22: qty 50

## 2013-09-22 MED ORDER — PHENYLEPHRINE HCL 10 MG/ML IJ SOLN: 10.0000 mg | INTRAVENOUS | Status: DC | PRN

## 2013-09-22 MED ORDER — METHOCARBAMOL 100 MG/ML IJ SOLN
500.0000 mg | Freq: Four times a day (QID) | INTRAMUSCULAR | Status: DC | PRN
  Filled 2013-09-22: qty 5

## 2013-09-22 MED ORDER — VALSARTAN-HYDROCHLOROTHIAZIDE 160-25 MG PO TABS: 1.0000 | ORAL_TABLET | ORAL | Status: DC

## 2013-09-22 MED ORDER — CEFAZOLIN SODIUM 1-5 GM-% IV SOLN
INTRAVENOUS | Status: AC
  Filled 2013-09-22: qty 50

## 2013-09-22 MED ORDER — LIDOCAINE HCL (CARDIAC) 20 MG/ML IV SOLN
INTRAVENOUS | Status: DC | PRN
  Administered 2013-09-22: 60 mg via INTRAVENOUS

## 2013-09-22 MED ORDER — DOCUSATE SODIUM 100 MG PO CAPS
100.0000 mg | ORAL_CAPSULE | Freq: Two times a day (BID) | ORAL | Status: DC
  Administered 2013-09-22 – 2013-09-25 (×6): 100 mg via ORAL
  Filled 2013-09-22 (×6): qty 1

## 2013-09-22 MED ORDER — MORPHINE SULFATE 2 MG/ML IJ SOLN
0.5000 mg | INTRAMUSCULAR | Status: DC | PRN
  Administered 2013-09-24: 0.5 mg via INTRAVENOUS
  Filled 2013-09-22: qty 1

## 2013-09-22 MED ORDER — KETOROLAC TROMETHAMINE 60 MG/2ML IM SOLN
30.0000 mg | Freq: Once | INTRAMUSCULAR | Status: AC
  Administered 2013-09-22: 30 mg via INTRAMUSCULAR
  Filled 2013-09-22: qty 2

## 2013-09-22 MED ORDER — SULINDAC 200 MG PO TABS
200.0000 mg | ORAL_TABLET | Freq: Two times a day (BID) | ORAL | Status: DC
  Filled 2013-09-22 (×3): qty 1

## 2013-09-22 MED ORDER — EPHEDRINE SULFATE 50 MG/ML IJ SOLN
INTRAMUSCULAR | Status: AC
  Filled 2013-09-22: qty 1

## 2013-09-22 MED ORDER — GLYCOPYRROLATE 0.2 MG/ML IJ SOLN
INTRAMUSCULAR | Status: DC | PRN
  Administered 2013-09-22 (×2): 0.4 mg via INTRAVENOUS

## 2013-09-22 MED ORDER — LIDOCAINE HCL (CARDIAC) 20 MG/ML IV SOLN
INTRAVENOUS | Status: AC
  Filled 2013-09-22: qty 5

## 2013-09-22 MED ORDER — PROPOFOL 10 MG/ML IV BOLUS
INTRAVENOUS | Status: DC | PRN
  Administered 2013-09-22: 200 mg via INTRAVENOUS

## 2013-09-22 MED ORDER — CALCIUM CHLORIDE 10 % IV SOLN
INTRAVENOUS | Status: AC
  Filled 2013-09-22: qty 10

## 2013-09-22 MED ORDER — METOPROLOL SUCCINATE ER 100 MG PO TB24
100.0000 mg | ORAL_TABLET | Freq: Every day | ORAL | Status: DC
  Administered 2013-09-23 – 2013-09-25 (×3): 100 mg via ORAL
  Filled 2013-09-22 (×4): qty 1

## 2013-09-22 MED ORDER — EPHEDRINE SULFATE 50 MG/ML IJ SOLN
INTRAMUSCULAR | Status: DC | PRN
  Administered 2013-09-22: 10 mg via INTRAVENOUS
  Administered 2013-09-22 (×2): 15 mg via INTRAVENOUS

## 2013-09-22 MED ORDER — PHENYLEPHRINE HCL 10 MG/ML IJ SOLN
10.0000 mg | INTRAVENOUS | Status: DC | PRN
  Administered 2013-09-22: 40 ug/min via INTRAVENOUS
  Administered 2013-09-22: 100 ug/min via INTRAVENOUS

## 2013-09-22 MED ORDER — PHENYLEPHRINE 40 MCG/ML (10ML) SYRINGE FOR IV PUSH (FOR BLOOD PRESSURE SUPPORT)
PREFILLED_SYRINGE | INTRAVENOUS | Status: AC
  Filled 2013-09-22: qty 10

## 2013-09-22 MED ORDER — OXYCODONE HCL 5 MG PO TABS
5.0000 mg | ORAL_TABLET | ORAL | Status: DC | PRN
  Administered 2013-09-22 – 2013-09-25 (×6): 10 mg via ORAL
  Filled 2013-09-22 (×7): qty 2

## 2013-09-22 MED ORDER — METHOCARBAMOL 500 MG PO TABS
500.0000 mg | ORAL_TABLET | Freq: Four times a day (QID) | ORAL | Status: DC | PRN
  Administered 2013-09-22 – 2013-09-24 (×6): 500 mg via ORAL
  Filled 2013-09-22 (×7): qty 1

## 2013-09-22 MED ORDER — ROCURONIUM BROMIDE 50 MG/5ML IV SOLN
INTRAVENOUS | Status: AC
  Filled 2013-09-22: qty 1

## 2013-09-22 MED ORDER — PHENYLEPHRINE HCL 10 MG/ML IJ SOLN
INTRAMUSCULAR | Status: DC | PRN
  Administered 2013-09-22: 80 ug via INTRAVENOUS

## 2013-09-22 MED ORDER — ONDANSETRON HCL 4 MG/2ML IJ SOLN
4.0000 mg | Freq: Once | INTRAMUSCULAR | Status: AC
  Administered 2013-09-22: 4 mg via INTRAVENOUS
  Filled 2013-09-22: qty 2

## 2013-09-22 MED ORDER — HYDROMORPHONE HCL PF 1 MG/ML IJ SOLN: 0.2500 mg | INTRAMUSCULAR | Status: DC | PRN

## 2013-09-22 MED ORDER — SODIUM CHLORIDE 0.9 % IV SOLN
INTRAVENOUS | Status: DC
  Administered 2013-09-22: 21:00:00 via INTRAVENOUS
  Administered 2013-09-23: 100 mL/h via INTRAVENOUS

## 2013-09-22 MED ORDER — PROPOFOL 10 MG/ML IV BOLUS
INTRAVENOUS | Status: AC
  Filled 2013-09-22: qty 20

## 2013-09-22 MED ORDER — 0.9 % SODIUM CHLORIDE (POUR BTL) OPTIME
TOPICAL | Status: DC | PRN
  Administered 2013-09-22: 1000 mL

## 2013-09-22 MED ORDER — ENOXAPARIN SODIUM 40 MG/0.4ML ~~LOC~~ SOLN
40.0000 mg | SUBCUTANEOUS | Status: DC
  Administered 2013-09-23 – 2013-09-25 (×3): 40 mg via SUBCUTANEOUS
  Filled 2013-09-22 (×4): qty 0.4

## 2013-09-22 SURGICAL SUPPLY — 53 items
BANDAGE GAUZE ELAST BULKY 4 IN (GAUZE/BANDAGES/DRESSINGS) ×3 IMPLANT
BIT DRILL 4.3MMS DISTAL GRDTED (BIT) IMPLANT
BNDG COHESIVE 6X5 TAN STRL LF (GAUZE/BANDAGES/DRESSINGS) ×3 IMPLANT
CANISTER SUCT 3000ML (MISCELLANEOUS) ×3 IMPLANT
CHLORAPREP W/TINT 26ML (MISCELLANEOUS) ×3 IMPLANT
COVER MAYO STAND STRL (DRAPES) ×2 IMPLANT
COVER SURGICAL LIGHT HANDLE (MISCELLANEOUS) ×6 IMPLANT
DRAPE INCISE IOBAN 66X45 STRL (DRAPES) IMPLANT
DRAPE PROXIMA HALF (DRAPES) IMPLANT
DRAPE STERI IOBAN 125X83 (DRAPES) ×3 IMPLANT
DRAPE U-SHAPE 47X51 STRL (DRAPES) ×3 IMPLANT
DRILL 4.3MMS DISTAL GRADUATED (BIT) ×3
DRSG ADAPTIC 3X8 NADH LF (GAUZE/BANDAGES/DRESSINGS) ×3 IMPLANT
DRSG MEPILEX BORDER 4X4 (GAUZE/BANDAGES/DRESSINGS) ×3 IMPLANT
DRSG MEPILEX BORDER 4X8 (GAUZE/BANDAGES/DRESSINGS) ×3 IMPLANT
ELECT REM PT RETURN 9FT ADLT (ELECTROSURGICAL) ×3
ELECTRODE REM PT RTRN 9FT ADLT (ELECTROSURGICAL) ×1 IMPLANT
EVACUATOR 1/8 PVC DRAIN (DRAIN) IMPLANT
FACESHIELD LNG OPTICON STERILE (SAFETY) ×6 IMPLANT
GLOVE BIO SURGEON STRL SZ7 (GLOVE) ×3 IMPLANT
GLOVE BIO SURGEON STRL SZ8 (GLOVE) ×3 IMPLANT
GLOVE BIOGEL PI IND STRL 7.5 (GLOVE) ×1 IMPLANT
GLOVE BIOGEL PI IND STRL 8 (GLOVE) ×1 IMPLANT
GLOVE BIOGEL PI INDICATOR 7.5 (GLOVE) ×2
GLOVE BIOGEL PI INDICATOR 8 (GLOVE) ×2
GOWN STRL REUS W/ TWL LRG LVL3 (GOWN DISPOSABLE) ×1 IMPLANT
GOWN STRL REUS W/ TWL XL LVL3 (GOWN DISPOSABLE) IMPLANT
GOWN STRL REUS W/TWL LRG LVL3 (GOWN DISPOSABLE) ×3
GOWN STRL REUS W/TWL XL LVL3 (GOWN DISPOSABLE)
GUIDEWIRE BALL NOSE 100CM (WIRE) ×4 IMPLANT
HIP FRAC NAIL LAG SCR 10.5X100 (Orthopedic Implant) ×2 IMPLANT
KIT ROOM TURNOVER OR (KITS) ×3 IMPLANT
NAIL HIP FRAC RT 130 11MX400M (Nail) ×2 IMPLANT
NS IRRIG 1000ML POUR BTL (IV SOLUTION) ×3 IMPLANT
PACK GENERAL/GYN (CUSTOM PROCEDURE TRAY) ×3 IMPLANT
PAD ARMBOARD 7.5X6 YLW CONV (MISCELLANEOUS) ×6 IMPLANT
PADDING CAST SYNTHETIC 4 (CAST SUPPLIES) ×2
PADDING CAST SYNTHETIC 4X4 STR (CAST SUPPLIES) ×1 IMPLANT
SCREW ANIT ROTATION 100MM HIP (Screw) ×2 IMPLANT
SCREW BONE CORTICAL 5.0X54 (Screw) ×2 IMPLANT
SCREW CANN THRD AFF 10.5X100 (Orthopedic Implant) IMPLANT
SCREW CORTICAL 5.0X65 (Screw) ×2 IMPLANT
STAPLER VISISTAT 35W (STAPLE) ×3 IMPLANT
SUT ETHILON 2 0 PSLX (SUTURE) ×4 IMPLANT
SUT MNCRL AB 4-0 PS2 18 (SUTURE) ×3 IMPLANT
SUT PROLENE 3 0 PS 1 (SUTURE) ×3 IMPLANT
SUT VIC AB 0 CT1 27 (SUTURE) ×12
SUT VIC AB 0 CT1 27XBRD ANBCTR (SUTURE) ×2 IMPLANT
SUT VIC AB 2-0 CT1 27 (SUTURE) ×12
SUT VIC AB 2-0 CT1 TAPERPNT 27 (SUTURE) ×2 IMPLANT
TOWEL OR 17X24 6PK STRL BLUE (TOWEL DISPOSABLE) ×3 IMPLANT
TOWEL OR 17X26 10 PK STRL BLUE (TOWEL DISPOSABLE) ×3 IMPLANT
WATER STERILE IRR 1000ML POUR (IV SOLUTION) ×3 IMPLANT

## 2013-09-22 NOTE — Preoperative (Addendum)
Beta Blockers   Reason not to administer Beta Blockers:will evaluate pt. intraop and give beta blocker if she remains stable after induction        Add..will hold beta blocker due to initial hypotension on induction and episode of 2nd degree heart block

## 2013-09-22 NOTE — ED Provider Notes (Signed)
CSN: [id number]     Arrival date & time 09/22/13  0827 History   First MD Initiated Contact with Patient 09/22/13 [phone]     Chief Complaint  Patient presents with  . Fall  . Leg Pain    RT     (Consider location/radiation/quality/duration/timing/severity/associated sxs/prior Treatment) The history is provided by the patient. No language interpreter was used.   80y.o AA F with hx of arthritis of back and LE brought to ED with 10/10 pain in her R thigh after a fall getting into bed last night around 9p.m  The pt describes falling while being helped into bed by her granddaughter.  She denies dizziness, hitting her head, LOC, diaphoresis.  The pt did not have any pain after falling but woke up unable to move her leg.  Denies taking any blood thinner, any urinary changes or trouble having bowel movements.  Past Medical History  Diagnosis Date  . Arthritis   . Hyperlipidemia   . Hypertension   . History of kidney stones    Past Surgical History  Procedure Laterality Date  . No past surgeries     Family History  Problem Relation Age of Onset  . Osteoarthritis Mother   . Hypertension Mother   . Hypertension Father   . Diabetes Son   . Diabetes Son   . Cancer Father     unknown type   History  Substance Use Topics  . Smoking status: Never Smoker   . Smokeless tobacco: Not on file     Comment: lives w/ dtr dorthea, 2nd dtr and 3 sons in town  . Alcohol Use: No   OB History   Grav Para Term Preterm Abortions TAB SAB Ect Mult Living                 Review of Systems  Constitutional: Negative for fever.  Cardiovascular: Negative for palpitations.  Gastrointestinal: Negative for abdominal pain.  Musculoskeletal: Negative for back pain and gait problem.  Neurological: Negative for weakness, light-headedness and numbness.  All other systems reviewed and are negative.      Allergies  Tetanus toxoids  Home Medications   No current outpatient prescriptions on file. BP  132/64  Pulse 78  Temp(Src) 98.8 F (37.1 C) (Oral)  Resp 31  Ht 5\' 2"  (1.575 m)  Wt 272 lb (123.378 kg)  BMI 49.74 kg/m2  SpO2 98% Physical Exam  Nursing note and vitals reviewed. Constitutional: She is oriented to person, place, and time. She appears well-developed and well-nourished. No distress.  HENT:  Head: Normocephalic and atraumatic.  Eyes: Conjunctivae and EOM are normal. Pupils are equal, round, and reactive to light.  Cardiovascular: Normal rate, regular rhythm and normal heart sounds.   Pulmonary/Chest: Effort normal and breath sounds normal. No respiratory distress. She has no wheezes.  Abdominal: Soft. Bowel sounds are normal. She exhibits no distension. There is no tenderness.  Musculoskeletal:  Limited ROM, right leg due to discomfort. Good distal sensation, 2+ pulses and brisk capillary refill. No numbness or tingling. No obvious deformity.    Neurological: She is alert and oriented to person, place, and time.  Skin: Skin is warm and dry.  Psychiatric: She has a normal mood and affect. Her behavior is normal. Judgment and thought content normal.    ED Course  Procedures (including critical care time) Labs Review Labs Reviewed  URINALYSIS, ROUTINE W REFLEX MICROSCOPIC - Abnormal; Notable for the following:    APPearance CLOUDY (*)  Leukocytes, UA MODERATE (*)    All other components within normal limits  URINE MICROSCOPIC-ADD ON - Abnormal; Notable for the following:    Bacteria, UA MANY (*)    All other components within normal limits  CBC WITH DIFFERENTIAL - Abnormal; Notable for the following:    RBC 3.85 (*)    Hemoglobin 10.8 (*)    HCT 33.6 (*)    Neutrophils Relative % 80 (*)    Neutro Abs 8.0 (*)    All other components within normal limits  BASIC METABOLIC PANEL   Imaging Review Dg Pelvis 1-2 Views  09/22/2013   CLINICAL DATA:  Anterior right thigh pain, fell last night  EXAM: PELVIS - 1-2 VIEW  COMPARISON:  None  FINDINGS: Displaced oblique  fracture proximal right femoral diaphysis as reported on right femoral radiographs.  Degenerative changes of the hip joints, advanced on left.  Diffuse osseous demineralization.  Pelvis intact.  No additional fracture or dislocation.  IMPRESSION: Displaced oblique fracture at the proximal right femoral diaphysis.  Osseous demineralization with advanced degenerative changes of the left hip joint.   Electronically Signed   By: Lavonia Dana M.D.   On: 09/22/2013 10:20   Dg Femur Right  09/22/2013   CLINICAL DATA:  Anterior right thigh pain, fell last night  EXAM: RIGHT FEMUR - 2 VIEW  COMPARISON:  None  FINDINGS: Diffuse osseous demineralization.  Oblique fracture of the proximal right femoral diaphysis, displaced laterally posteriorly with mild overriding.  Degenerative changes of right hip joint and right knee joint.  No additional fracture or dislocation.  IMPRESSION: Displaced oblique proximal right femoral diaphyseal fracture.  Osseous demineralization with degenerative changes right knee and right hip.   Electronically Signed   By: Lavonia Dana M.D.   On: 09/22/2013 10:19     EKG Interpretation   Date/Time:  Saturday September 22 2013 08:56:55 EDT Ventricular Rate:  75 PR Interval:  188 QRS Duration: 92 QT Interval:  360 QTC Calculation: 402 R Axis:   43 Text Interpretation:  Sinus or ectopic atrial rhythm No old tracing to  compare Confirmed by [facility]  MD, Nunzio Cory [phone]) on 09/22/2013 11:14:27  AM     12:01 PM Right femur; displaced, oblique proximal femur fracture. Consult to Ortho, Dr. Doran Durand. MDM   Final diagnoses:  Femur fracture, right    To OR via Dr. Doran Durand. Talked with Dr. Tyrell Antonio from [place] [facility] and she will follow and admit pt. CBC; Hgb 10.9, Hct 33.6. EKG; within normal limits. VS stable during stay in ER. Discussed plan with pt and she agrees.        Elisha Headland, NP 09/22/13 1201

## 2013-09-22 NOTE — ED Notes (Signed)
PT reports falling while getting in her bed last night . Pt family helped her into bed after fall and rested last night . Pt denies Hitting head or any LOC. Pt woke this AM with a reported 10/10 RT inner thigh pain.  PT also reports new pain in RT shoulder that was present before FALL. PT A/O on arrival to ED via EMS transport.

## 2013-09-22 NOTE — Progress Notes (Signed)
Orthopedic Tech Progress Note Patient Details:  Shawna Fuentes Dec 19, 1932 [id number]  Ortho Devices Ortho Device/Splint Location: put ohf on bed Ortho Device/Splint Interventions: Criss Alvine 09/22/2013, 8:07 PM

## 2013-09-22 NOTE — Consult Note (Signed)
[facility] Medical Consultation  Shawna Fuentes QIO:[id number] DOB: 1933-06-28 DOA: 09/22/2013 PCP: Gwendolyn Grant, MD   Requesting physician: Dr. Doran Durand Date of consultation: 3.21.2015 Reason for consultation: medical managemnt  Impression/Recommendations Fracture, subtrochanteric, right femur, closed - Status post femur fracture repair with intra-medullary nailing. We'll continue narcotics for pain weightbearing Per ortho.  Acute blood loss anemia: - Getting 2 units of packed red blood cells we'll check a CBC posttransfusion normal. We'll monitor strict I.'s and O.'s. - Resume her HCTZ tomorrow morning.  Hypertension: - Continued her metoprolol for cardioprotection. Hold on NSAIDs or ARB and hydrochlorothiazide. Can resume tomorrow morning.   I will followup again tomorrow. Please contact me if I can be of assistance in the meanwhile. Thank you for this consultation.  Chief Complaint: HTN  HPI:  This 80 year old female with past medical history of obesity, hypertension and arthritis of the knee I felt last night slept okay but was unable to ambulate this morning. Was brought here by EMS complaining of aching pain to the right thigh. More severe with movement. She was she trips she denies any loss of consciousness shortness of breath chest pain nausea vomiting.  Review of Systems:  Constitutional:  No weight loss, night sweats, Fevers, chills, fatigue.  HEENT:  No headaches, Difficulty swallowing,Tooth/dental problems,Sore throat,  No sneezing, itching, ear ache, nasal congestion, post nasal drip,  Cardio-vascular:  No chest pain, Orthopnea, PND, swelling in lower extremities, anasarca, dizziness, palpitations  GI:  No heartburn, indigestion, abdominal pain, nausea, vomiting, diarrhea, change in bowel habits, loss of appetite  Resp:  No shortness of breath with exertion or at rest. No excess mucus, no productive cough, No non-productive cough, No coughing up of blood.No  change in color of mucus.No wheezing.No chest wall deformity  Skin:  no rash or lesions.  GU:  no dysuria, change in color of urine, no urgency or frequency. No flank pain.  Musculoskeletal:  No joint pain or swelling. No decreased range of motion. No back pain.  Psych:  No change in mood or affect. No depression or anxiety. No memory loss.   Past Medical History  Diagnosis Date  . Arthritis   . Hyperlipidemia   . Hypertension   . History of kidney stones    Past Surgical History  Procedure Laterality Date  . No past surgeries     Social History:  reports that she has never smoked. She does not have any smokeless tobacco history on file. She reports that she does not drink alcohol or use illicit drugs.  Allergies  Allergen Reactions  . Tetanus Toxoids     Pt declines   Family History  Problem Relation Age of Onset  . Osteoarthritis Mother   . Hypertension Mother   . Hypertension Father   . Diabetes Son   . Diabetes Son   . Cancer Father     unknown type    Prior to Admission medications   Medication Sig Start Date End Date Taking? Authorizing Provider  aspirin 81 MG tablet Take 81 mg by mouth daily.   Yes Historical Provider, MD  atorvastatin (LIPITOR) 10 MG tablet Take 10 mg by mouth every evening.   Yes Historical Provider, MD  metoprolol succinate (TOPROL-XL) 100 MG 24 hr tablet Take 100 mg by mouth every morning. Take with or immediately following a meal.   Yes Historical Provider, MD  sulindac (CLINORIL) 200 MG tablet Take 200 mg by mouth 2 (two) times daily.   Yes Historical Provider, MD  traMADol (ULTRAM) 50 MG tablet Take 25-50 mg by mouth every 8 (eight) hours as needed for moderate pain.   Yes Historical Provider, MD  valsartan-hydrochlorothiazide (DIOVAN-HCT) 160-25 MG per tablet Take 1 tablet by mouth every morning.   Yes Historical Provider, MD   Physical Exam: Blood pressure 116/69, pulse 109, temperature 97.5 F (36.4 C), temperature source Axillary,  resp. rate 16, height 5\' 2"  (1.575 m), weight 123.378 kg (272 lb), SpO2 100.00%. Filed Vitals:   09/22/13 1815  BP: 116/69  Pulse:   Temp:   Resp:      General:  She she is currently sedated only responsive to name.  Eyes: Anicteric no pallor.  ENT: Mucous membranes are dry  Neck: Thick neck Central trachea no appreciated JVD  Cardiovascular: Regular with minimal positive S1-S2 no murmurs rubs gallops  Respiratory: Good air movement clear to auscultation  Abdomen: The bowel sounds no tenderness abdomen soft.  Neurologic: Nonfocal except for the right side lower extremity which is limited by pain.  Labs on Admission:  Basic Metabolic Panel:  Recent Labs Lab 09/22/13 1110  NA 142  K 4.2  CL 102  CO2 26  GLUCOSE 112*  BUN 35*  CREATININE 0.86  CALCIUM 9.8   Liver Function Tests: No results found for this basename: AST, ALT, ALKPHOS, BILITOT, PROT, ALBUMIN,  in the last 168 hours No results found for this basename: LIPASE, AMYLASE,  in the last 168 hours No results found for this basename: AMMONIA,  in the last 168 hours CBC:  Recent Labs Lab 09/22/13 1110  WBC 10.0  NEUTROABS 8.0*  HGB 10.8*  HCT 33.6*  MCV 87.3  PLT 180   Cardiac Enzymes:  Recent Labs Lab 09/17/13 1437  CKTOTAL 198*   BNP: No components found with this basename: POCBNP,  CBG: No results found for this basename: GLUCAP,  in the last 168 hours  Radiological Exams on Admission: Dg Pelvis 1-2 Views  09/22/2013   CLINICAL DATA:  Anterior right thigh pain, fell last night  EXAM: PELVIS - 1-2 VIEW  COMPARISON:  None  FINDINGS: Displaced oblique fracture proximal right femoral diaphysis as reported on right femoral radiographs.  Degenerative changes of the hip joints, advanced on left.  Diffuse osseous demineralization.  Pelvis intact.  No additional fracture or dislocation.  IMPRESSION: Displaced oblique fracture at the proximal right femoral diaphysis.  Osseous demineralization with  advanced degenerative changes of the left hip joint.   Electronically Signed   By: Lavonia Dana M.D.   On: 09/22/2013 10:20   Dg Femur Right  09/22/2013   CLINICAL DATA:  Anterior right thigh pain, fell last night  EXAM: RIGHT FEMUR - 2 VIEW  COMPARISON:  None  FINDINGS: Diffuse osseous demineralization.  Oblique fracture of the proximal right femoral diaphysis, displaced laterally posteriorly with mild overriding.  Degenerative changes of right hip joint and right knee joint.  No additional fracture or dislocation.  IMPRESSION: Displaced oblique proximal right femoral diaphyseal fracture.  Osseous demineralization with degenerative changes right knee and right hip.   Electronically Signed   By: Lavonia Dana M.D.   On: 09/22/2013 10:19   Dg Chest Portable 1 View  09/22/2013   CLINICAL DATA:  FALL LEG PAIN  EXAM: PORTABLE CHEST - 1 VIEW  COMPARISON:  None.  FINDINGS: Low lung volumes. Cardiac silhouette is mild-to-moderately enlarged. Increased confluent density projects within the right peritracheal region. The aorta is tortuous and ectatic. The lungs are clear. Degenerative changes appreciated within  the right and left shoulders.  IMPRESSION: Low lung volumes. Increased confluent density in the right paratracheal region. Repeat PA and lateral views of the chest with deeper inspiration is recommended. A mass or consolidative infiltrate in the right peritracheal region cannot be excluded.   Electronically Signed   By: Margaree Mackintosh M.D.   On: 09/22/2013 12:12    EKG: Independently reviewed. none  Time spent: 65 minutes  Charlynne Cousins [facility] Pager [phone]  If 7PM-7AM, please contact night-coverage [email or web address] Password [facility] 09/22/2013, 6:24 PM

## 2013-09-22 NOTE — Anesthesia Postprocedure Evaluation (Signed)
  Anesthesia Post-op Note  Patient: Shawna Fuentes  Procedure(s) Performed: Procedure(s): INTRAMEDULLARY (IM) NAIL FEMORAL (Right)  Patient Location: PACU  Anesthesia Type:General  Level of Consciousness: awake, alert , oriented and patient cooperative  Airway and Oxygen Therapy: Patient Spontanous Breathing  Post-op Pain: mild  Post-op Assessment: Post-op Vital signs reviewed, Patient's Cardiovascular Status Stable, Respiratory Function Stable, Patent Airway, No signs of Nausea or vomiting and Pain level controlled  Post-op Vital Signs: stable  Complications: No apparent anesthesia complications

## 2013-09-22 NOTE — Anesthesia Procedure Notes (Signed)
Procedure Name: Intubation Date/Time: 09/22/2013 1:40 PM Performed by: Marinda Elk A Pre-anesthesia Checklist: Patient identified, Timeout performed, Emergency Drugs available, Suction available and Patient being monitored Patient Re-evaluated:Patient Re-evaluated prior to inductionOxygen Delivery Method: Circle system utilized Preoxygenation: Pre-oxygenation with 100% oxygen Intubation Type: IV induction Laryngoscope Size: Mac and 3 Grade View: Grade I Tube type: Oral Tube size: 7.5 mm Number of attempts: 1 Airway Equipment and Method: Stylet Placement Confirmation: ETT inserted through vocal cords under direct vision,  breath sounds checked- equal and bilateral and positive ETCO2 Secured at: 21 cm Tube secured with: Tape Dental Injury: Teeth and Oropharynx as per pre-operative assessment

## 2013-09-22 NOTE — Transfer of Care (Signed)
Immediate Anesthesia Transfer of Care Note  Patient: Shawna Fuentes  Procedure(s) Performed: Procedure(s): INTRAMEDULLARY (IM) NAIL FEMORAL (Right)  Patient Location: PACU  Anesthesia Type:General  Level of Consciousness: sedated  Airway & Oxygen Therapy: Patient Spontanous Breathing and Patient connected to face mask oxygen  Post-op Assessment: Report given to PACU RN and Post -op Vital signs reviewed and stable  Post vital signs: Reviewed and stable  Complications: No apparent anesthesia complications

## 2013-09-22 NOTE — Op Note (Signed)
Shawna Fuentes, Shawna Fuentes                 ACCOUNT NO.:  [id number]  MEDICAL RECORD NO.:  [id number]  LOCATION:  [facility]                         FACILITY:  [facility]  PHYSICIAN:  Wylene Simmer, MD        DATE OF BIRTH:  [birth year]  DATE OF PROCEDURE:  09/22/2013 DATE OF DISCHARGE:                              OPERATIVE REPORT   PREOPERATIVE DIAGNOSIS:  Right subtrochanteric femur fracture.  POSTOPERATIVE DIAGNOSIS:  Right subtrochanteric femur fracture.  PROCEDURE:  Open treatment of right subtrochanteric femur fracture with intramedullary nailing.  SURGEON:  Wylene Simmer, MD  ANESTHESIA:  General.  ESTIMATED BLOOD LOSS:  1500 mL.  TOURNIQUET TIME:  Zero.  COMPLICATIONS:  None apparent.  DISPOSITION:  Extubated, awake and stable to recovery.  INDICATIONS FOR PROCEDURE:  The patient is a 80 year old morbidly obese woman who fell while getting into bed last night.  She slept in her bed and then was unable to bear weight on her right lower extremity when she got up this morning.  She was brought to the emergency department, where x-rays revealed a displaced subtrochanteric femur fracture.  She presents now for open treatment of this lower extremity fracture.  She and her daughter who was power of attorney, understand the risks and benefits, the alternative treatment options and elects surgical treatment.  They specifically understand risks of bleeding, infection, nerve damage, blood clots, need for additional surgery, amputation, and death.  Of note, her preoperative BMI is 49.7.  PROCEDURE IN DETAIL:  After preoperative consent was obtained and the correct operative site was identified, the patient was brought to the operating room and placed supine on the stretcher.  General anesthesia was induced.  Preoperative antibiotics were administered.  Surgical time- out was taken.  The patient was then moved onto the operating table. Her left lower extremity was supported with a outrigger from  the fracture table.  The right lower extremity was placed in a well-padded traction boot.  Longitudinal traction was applied, followed by internal and external rotation.  The patient's fracture site on the lateral view, was noted to still be widely displaced despite the use of a crutch serving as a bolster.  The right lower extremity was then prepped and draped in standard sterile fashion.  A longitudinal incision was made at the tip of the greater trochanter.  Sharp dissection was carried down through the skin, and copious subcutaneous tissue.  The gluteus fascia was incised.  The guide pin was inserted at the tip of the greater trochanter.  AP and lateral radiographs confirmed appropriate position of the guide pin and was advanced into the tip of the trochanter.  An awl was then used to open the proximal end of the greater trochanter.  A ball-tip guidewire was then inserted.  An attempt was made to pass it across the fracture site, but this was unsuccessful.  The decision was made to open the fracture to improve the reduction.  A longitudinal incision was made in the lateral thigh adjacent to the fracture site. Sharp dissection was carried down through the skin and subcutaneous tissue.  The IT band was incised in line with its fibers.  The vastus lateralis  muscle was retracted anteriorly.  Fracture was cleaned of all hematoma.  The fracture was clamped with a [place] claw and a lobster claw.  This was a long spiral fracture with some comminution.  Given the patient's size, it was quite difficult to achieve reduction, but ultimately this was accomplished with traction, internal and external rotation, and the use of both clamps.  Once reduction was achieved, the guide pin was advanced across the fracture site to the level of the distal femur.  AP and lateral radiographs confirmed appropriate position of the guide pin.  The 12.5 mm reamer was then inserted over the guide pin across the  fracture site.  The guide pin was measured and a 400 mm x 11 mm Biomet affixus nail was selected.  It was inserted in the proximal fragment.  There was considerable difficulty getting the nail across the fracture site.  Multiple re-reductions were performed and ultimately the nail was successfully passed across the fracture site.  Traction was released and the fracture was allowed to compress.  The targeting jig was then used to insert a lag screw in the femoral head.  AP and lateral radiographs confirmed appropriate position of this screw.  The screw was locked to the proximal portion of the nail.  The perfect circle technique was then used to insert 2 interlocking screws, trying to match the external rotation of the right lower extremity to that of the left. AP and lateral radiographs at the proximal and distal ends of the nail and the fracture site showed appropriate reduction of the fracture and appropriate position and length of all hardware.  The proximal 2 wounds were irrigated copiously.  The IT band and gluteus fascia were closed with simple sutures of 0 Vicryl.  The subcutaneous tissue was approximated with inverted simple sutures of 3-0 Vicryl, a running 2-0 nylon was used to close the skin incisions.  Horizontal mattress sutures were used to close the 2 distal incisions.  Sterile dressings were applied.  The patient was then awakened by anesthesia and transported to the recovery room in stable condition.  FOLLOWUP PLAN:  Advised to be nonweightbearing on the right lower extremity.  She will be admitted to the Medicine Service for likely placement at a skilled nursing facility for rehab.  The procedure was made exceptionally difficult by the patient's tremendous size.  This made placement of the instruments as well as reduction of the fracture, quite challenging and extended the length of the case by at least 100%.     Wylene Simmer, MD     JH/MEDQ  D:  09/22/2013  T:   09/22/2013  Job:  [id number]

## 2013-09-22 NOTE — Consult Note (Signed)
Reason for Consult:  Right hip fracture Referring Physician: Irish Elders, NP  Shawna Fuentes is an 80 y.o. female.  HPI:  80 y/o female with PMH of obesity and knee arthritis fell getting into bed last night.  Slept ok but unable to bear weight on her R LE this morning.  Brought to ER by EMS.  She c/o aching pain in the right thigh that is mild with holding still and more severe with movement.  No h/o previous hip fracture.  She has recently had more difficulty getting around due to knee pain.  No h/o smoking or diabetes.  Daughter and granddaughter with her today.  She denies any h/o frequent falls.  No blood thinners.  Past Medical History  Diagnosis Date  . Arthritis   . Hyperlipidemia   . Hypertension   . History of kidney stones     Past Surgical History  Procedure Laterality Date  . No past surgeries      Family History  Problem Relation Age of Onset  . Osteoarthritis Mother   . Hypertension Mother   . Hypertension Father   . Diabetes Son   . Diabetes Son   . Cancer Father     unknown type    Social History:  reports that she has never smoked. She does not have any smokeless tobacco history on file. She reports that she does not drink alcohol or use illicit drugs.  Allergies:  Allergies  Allergen Reactions  . Tetanus Toxoids     Pt declines    Medications: I have reviewed the patient's current medications.  Results for orders placed during the hospital encounter of 09/22/13 (from the past 48 hour(s))  URINALYSIS, ROUTINE W REFLEX MICROSCOPIC     Status: Abnormal   Collection Time    09/22/13  8:56 AM      Result Value Ref Range   Color, Urine YELLOW  YELLOW   APPearance CLOUDY (*) CLEAR   Specific Gravity, Urine 1.021  1.005 - 1.030   pH 5.0  5.0 - 8.0   Glucose, UA NEGATIVE  NEGATIVE mg/dL   Hgb urine dipstick NEGATIVE  NEGATIVE   Bilirubin Urine NEGATIVE  NEGATIVE   Ketones, ur NEGATIVE  NEGATIVE mg/dL   Protein, ur NEGATIVE  NEGATIVE mg/dL   Urobilinogen, UA 1.0  0.0 - 1.0 mg/dL   Nitrite NEGATIVE  NEGATIVE   Leukocytes, UA MODERATE (*) NEGATIVE  URINE MICROSCOPIC-ADD ON     Status: Abnormal   Collection Time    09/22/13  8:56 AM      Result Value Ref Range   Squamous Epithelial / LPF RARE  RARE   WBC, UA 0-2  <3 WBC/hpf   Bacteria, UA MANY (*) RARE  CBC WITH DIFFERENTIAL     Status: Abnormal   Collection Time    09/22/13 11:10 AM      Result Value Ref Range   WBC 10.0  4.0 - 10.5 K/uL   RBC 3.85 (*) 3.87 - 5.11 MIL/uL   Hemoglobin 10.8 (*) 12.0 - 15.0 g/dL   HCT 97.2 (*) 82.0 - 60.1 %   MCV 87.3  78.0 - 100.0 fL   MCH 28.1  26.0 - 34.0 pg   MCHC 32.1  30.0 - 36.0 g/dL   RDW 56.1  53.7 - 94.3 %   Platelets 180  150 - 400 K/uL   Neutrophils Relative % 80 (*) 43 - 77 %   Neutro Abs 8.0 (*) 1.7 - 7.7 K/uL  Lymphocytes Relative 14  12 - 46 %   Lymphs Abs 1.4  0.7 - 4.0 K/uL   Monocytes Relative 5  3 - 12 %   Monocytes Absolute 0.5  0.1 - 1.0 K/uL   Eosinophils Relative 1  0 - 5 %   Eosinophils Absolute 0.1  0.0 - 0.7 K/uL   Basophils Relative 0  0 - 1 %   Basophils Absolute 0.0  0.0 - 0.1 K/uL  BASIC METABOLIC PANEL     Status: Abnormal   Collection Time    09/22/13 11:10 AM      Result Value Ref Range   Sodium 142  137 - 147 mEq/L   Potassium 4.2  3.7 - 5.3 mEq/L   Chloride 102  96 - 112 mEq/L   CO2 26  19 - 32 mEq/L   Glucose, Bld 112 (*) 70 - 99 mg/dL   BUN 35 (*) 6 - 23 mg/dL   Creatinine, Ser 0.86  0.50 - 1.10 mg/dL   Calcium 9.8  8.4 - 10.5 mg/dL   GFR calc non Af Amer 63 (*) >90 mL/min   GFR calc Af Amer 73 (*) >90 mL/min   Comment: (NOTE)     The eGFR has been calculated using the CKD EPI equation.     This calculation has not been validated in all clinical situations.     eGFR's persistently <90 mL/min signify possible Chronic Kidney     Disease.    Dg Pelvis 1-2 Views  09/22/2013   CLINICAL DATA:  Anterior right thigh pain, fell last night  EXAM: PELVIS - 1-2 VIEW  COMPARISON:  None   FINDINGS: Displaced oblique fracture proximal right femoral diaphysis as reported on right femoral radiographs.  Degenerative changes of the hip joints, advanced on left.  Diffuse osseous demineralization.  Pelvis intact.  No additional fracture or dislocation.  IMPRESSION: Displaced oblique fracture at the proximal right femoral diaphysis.  Osseous demineralization with advanced degenerative changes of the left hip joint.   Electronically Signed   By: Lavonia Dana M.D.   On: 09/22/2013 10:20   Dg Femur Right  09/22/2013   CLINICAL DATA:  Anterior right thigh pain, fell last night  EXAM: RIGHT FEMUR - 2 VIEW  COMPARISON:  None  FINDINGS: Diffuse osseous demineralization.  Oblique fracture of the proximal right femoral diaphysis, displaced laterally posteriorly with mild overriding.  Degenerative changes of right hip joint and right knee joint.  No additional fracture or dislocation.  IMPRESSION: Displaced oblique proximal right femoral diaphyseal fracture.  Osseous demineralization with degenerative changes right knee and right hip.   Electronically Signed   By: Lavonia Dana M.D.   On: 09/22/2013 10:19   Dg Chest Portable 1 View  09/22/2013   CLINICAL DATA:  FALL LEG PAIN  EXAM: PORTABLE CHEST - 1 VIEW  COMPARISON:  None.  FINDINGS: Low lung volumes. Cardiac silhouette is mild-to-moderately enlarged. Increased confluent density projects within the right peritracheal region. The aorta is tortuous and ectatic. The lungs are clear. Degenerative changes appreciated within the right and left shoulders.  IMPRESSION: Low lung volumes. Increased confluent density in the right paratracheal region. Repeat PA and lateral views of the chest with deeper inspiration is recommended. A mass or consolidative infiltrate in the right peritracheal region cannot be excluded.   Electronically Signed   By: Margaree Mackintosh M.D.   On: 09/22/2013 12:12    ROS:  No recent f/c/n/v/wt loss. PE:  Blood pressure 132/64, pulse 78,  temperature 98.8 F (37.1 C), temperature source Oral, resp. rate 31, height _0  (1.575 m), weight 123.378 kg (272 lb), SpO2 98.00%. Obese female in nad.  A and O x 4.  Mood and affect normal.  EOMI.  Resp unlabored.  R thigh with intact skin.  Sens to LT intact throughout the right LE.  5/5 strength in PF and DF of the ankle.  2+ dp and pt pulses.  No lymphadenopathy.  Assessment/Plan: Right subtrochanteric femur fracture - to OR for IM nailing.  The risks and benefits of the alternative treatment options have been discussed in detail.  The patient wishes to proceed with surgery and specifically understands risks of bleeding, infection, nerve damage, blood clots, need for additional surgery, amputation and death.   Wylene Simmer 09/28/13, 1:18 PM

## 2013-09-22 NOTE — Brief Op Note (Signed)
09/22/2013  5:49 PM  PATIENT:  Shawna Fuentes  80 y.o. female  PRE-OPERATIVE DIAGNOSIS:  right subtroch femur fracture  POST-OPERATIVE DIAGNOSIS:  same  Procedure(s): Open treatment of right femur subtrochanteric fracture with intramedullary nailing  SURGEON:  Wylene Simmer, MD  ASSISTANT: n/a  ANESTHESIA:   General  EBL:  1500 cc  TOURNIQUET:  none  COMPLICATIONS:  None apparent  DISPOSITION:  Extubated, awake and stable to recovery.  DICTATION ID:  [id number]

## 2013-09-22 NOTE — Anesthesia Preprocedure Evaluation (Addendum)
Anesthesia Evaluation  Patient identified by MRN, date of birth, ID band Patient awake    Reviewed: Allergy & Precautions, H&P , NPO status , Patient's Chart, lab work & pertinent test results, reviewed documented beta blocker date and time   Airway Mallampati: I TM Distance: >3 FB Neck ROM: Full    Dental  (+) Edentulous Upper, Edentulous Lower, Dental Advisory Given   Pulmonary          Cardiovascular hypertension, Pt. on medications and Pt. on home beta blockers     Neuro/Psych  Neuromuscular disease    GI/Hepatic   Endo/Other    Renal/GU Renal disease     Musculoskeletal  (+) Arthritis -, Osteoarthritis,    Abdominal   Peds  Hematology   Anesthesia Other Findings Low back pain  Reproductive/Obstetrics                          Anesthesia Physical Anesthesia Plan  ASA: II  Anesthesia Plan: General   Post-op Pain Management:    Induction: Intravenous  Airway Management Planned: Oral ETT  Additional Equipment:   Intra-op Plan:   Post-operative Plan: Extubation in OR  Informed Consent: I have reviewed the patients History and Physical, chart, labs and discussed the procedure including the risks, benefits and alternatives for the proposed anesthesia with the patient or authorized representative who has indicated his/her understanding and acceptance.     Plan Discussed with:   Anesthesia Plan Comments:         Anesthesia Quick Evaluation

## 2013-09-23 ENCOUNTER — Inpatient Hospital Stay (HOSPITAL_COMMUNITY): Payer: Medicare HMO

## 2013-09-23 DIAGNOSIS — IMO0002 Reserved for concepts with insufficient information to code with codable children: Secondary | ICD-10-CM

## 2013-09-23 DIAGNOSIS — M1710 Unilateral primary osteoarthritis, unspecified knee: Secondary | ICD-10-CM

## 2013-09-23 LAB — TYPE AND SCREEN
ABO/RH(D): B POS
ANTIBODY SCREEN: NEGATIVE
UNIT DIVISION: 0
Unit division: 0

## 2013-09-23 LAB — URINALYSIS, ROUTINE W REFLEX MICROSCOPIC
Glucose, UA: NEGATIVE mg/dL
KETONES UR: 15 mg/dL — AB
NITRITE: NEGATIVE
PH: 5 (ref 5.0–8.0)
Protein, ur: NEGATIVE mg/dL
SPECIFIC GRAVITY, URINE: 1.027 (ref 1.005–1.030)
Urobilinogen, UA: 1 mg/dL (ref 0.0–1.0)

## 2013-09-23 LAB — URINE MICROSCOPIC-ADD ON

## 2013-09-23 LAB — GLUCOSE, CAPILLARY
Glucose-Capillary: 123 mg/dL — ABNORMAL HIGH (ref 70–99)
Glucose-Capillary: 158 mg/dL — ABNORMAL HIGH (ref 70–99)
Glucose-Capillary: 113 mg/dL — ABNORMAL HIGH (ref 70–99)
Glucose-Capillary: 107 mg/dL — ABNORMAL HIGH (ref 70–99)

## 2013-09-23 LAB — BASIC METABOLIC PANEL
BUN: 36 mg/dL — ABNORMAL HIGH (ref 6–23)
CO2: 23 meq/L (ref 19–32)
Calcium: 8.9 mg/dL (ref 8.4–10.5)
Chloride: 103 mEq/L (ref 96–112)
Creatinine, Ser: 1.13 mg/dL — ABNORMAL HIGH (ref 0.50–1.10)
GFR calc Af Amer: 53 mL/min — ABNORMAL LOW (ref >90)
GFR calc non Af Amer: 45 mL/min — ABNORMAL LOW (ref >90)
Glucose, Bld: 124 mg/dL — ABNORMAL HIGH (ref 70–99)
Potassium: 4.7 mEq/L (ref 3.7–5.3)
Sodium: 139 mEq/L (ref 137–147)

## 2013-09-23 LAB — HEMOGLOBIN A1C
Hgb A1c MFr Bld: 5.7 % — ABNORMAL HIGH (ref <5.7)
MEAN PLASMA GLUCOSE: 117 mg/dL — AB (ref <117)

## 2013-09-23 MED ORDER — SULINDAC 200 MG PO TABS
200.0000 mg | ORAL_TABLET | Freq: Two times a day (BID) | ORAL | Status: DC | PRN
  Filled 2013-09-23: qty 1

## 2013-09-23 MED ORDER — SODIUM CHLORIDE 0.9 % IV BOLUS (SEPSIS)
500.0000 mL | Freq: Once | INTRAVENOUS | Status: AC
  Administered 2013-09-23: 500 mL via INTRAVENOUS

## 2013-09-23 MED ORDER — SODIUM CHLORIDE 0.9 % IV BOLUS (SEPSIS)
250.0000 mL | Freq: Once | INTRAVENOUS | Status: AC
  Administered 2013-09-23: 250 mL via INTRAVENOUS

## 2013-09-23 NOTE — Progress Notes (Signed)
Clinical Social Work Department BRIEF PSYCHOSOCIAL ASSESSMENT 09/23/2013  Patient:  Shawna Fuentes, Shawna Fuentes     Account Number:  [id number]     Admit date:  09/22/2013  Clinical Social Worker:  Rolinda Roan  Date/Time:  09/23/2013 04:45 PM  Referred by:  Physician  Date Referred:  09/22/2013 Referred for  SNF Placement   Other Referral:   Interview type:  Patient Other interview type:    PSYCHOSOCIAL DATA Living Status:  WITH ADULT CHILDREN Admitted from facility:   Level of care:   Primary support name:  Dyke Maes Primary support relationship to patient:  CHILD, ADULT Degree of support available:   Shawna Fuentes is patient's youngest child and goes by Shawna Fuentes. Shawna Fuentes was at bedside during assessment. Patient reported that she lives with Shawna Fuentes. Shawna Fuentes and her siblings provide strong support for patient.    CURRENT CONCERNS  Other Concerns:    SOCIAL WORK ASSESSMENT / PLAN Clinical Social Worker (CSW) met with patient and her daugher Shawna Fuentes, Shawna Fuentes, and son Shawna Fuentes were at the bedside. Patient reported that she is agreebale to short term rehab at a skilled nursing facility. Patient's son Shawna Fuentes reported that [employer] is their first choice because [name]'s daughter in Occupational psychologist works at [employer]. Sisters and patient are agreeable to [employer]. CSW explained to patient that her insurance [employer] will have to provide authorization before she can go to the facility. Patient verbalized her understanding. CSW contacted Raquel admissions coordinator at [facility] care who reported that they could accept the patient.   Assessment/plan status:  Psychosocial Support/Ongoing Assessment of Needs Other assessment/ plan:   Information/referral to community resources:   CSW gave patient SNF list.    PATIENT'S/FAMILY'S RESPONSE TO PLAN OF CARE: Patient and family thanked CSW for visit and starting placement process.

## 2013-09-23 NOTE — Progress Notes (Signed)
Subjective: 1 Day Post-Op Procedure(s) (LRB): INTRAMEDULLARY (IM) NAIL FEMORAL (Right) Patient reports pain as mild at the right thigh.  She c/o more severe pain in the dorsum of her left foot.  She c/o throbbing and paining in that foot.  She denies any burning or tingling.  She says over the last few weeks she has had increasing difficulty getting around.  She has not been able to walk the last few days.  Transfused 2 u PRBCs in PACU last night.  Objective: Vital signs in last 24 hours: Temp:  [97.2 F (36.2 C)-98.9 F (37.2 C)] 98.9 F (37.2 C) (03/22 0651) Pulse Rate:  [71-109] 80 (03/22 0651) Resp:  [9-31] 16 (03/22 0651) BP: (95-136)/(47-101) 101/50 mmHg (03/22 0651) SpO2:  [97 %-100 %] 100 % (03/22 0651) FiO2 (%):  [28 %] 28 % (03/21 2102) Weight:  [123.378 kg (272 lb)] 123.378 kg (272 lb) (03/21 0843)  Intake/Output from previous day: 03/21 0701 - 03/22 0700 In: 4535 [P.O.:600; I.V.:3100; Blood:335; IV Piggyback:500] Out: 8841 [Urine:530; Blood:1250] Intake/Output this shift:     Recent Labs  09/22/13 1110 09/22/13 1606 09/22/13 1746 09/23/13 0630  HGB 10.8* 8.8* 6.5* 8.6*    Recent Labs  09/22/13 1110  09/22/13 1746 09/23/13 0630  WBC 10.0  --   --  12.8*  RBC 3.85*  --   --  3.04*  HCT 33.6*  < > 19.0* 25.4*  PLT 180  --   --  130*  < > = values in this interval not displayed.  Recent Labs  09/22/13 1110 09/22/13 1606 09/22/13 1746  NA 142 139 139  K 4.2 3.7 3.9  CL 102  --   --   CO2 26  --   --   BUN 35*  --   --   CREATININE 0.86  --   --   GLUCOSE 112* 157* 175*  CALCIUM 9.8  --   --    No results found for this basename: LABPT, INR,  in the last 72 hours  PE:  morbidly obese female in NAD.  R thigh wounds dressed and dry.  R foot with normal sens to LT.  5/5 strength in PF and DF of the ankle and toes.  1+ dp and pt pulses.  NTTP at right knee.  L foot with NTTP at the dorsal an dplantar surfaces.  Skin healthy and intact.  1+ dp and pt  pulses.  5/5 strength in pf of the ankle.  3/5 strength in DF of the ankle and toes.  Sens to LT normal dorsally and plantarly.  No gross abnormailty noted.  Assessment/Plan: 1 Day Post-Op Procedure(s) (LRB): INTRAMEDULLARY (IM) NAIL FEMORAL (Right)  Pt will start PT and OT today.  I'll order xrays of the left foot, though I don't expect them to show any acute injury.  I'm suspicious that her weakness and recent difficulty walking may be coming from her back.  She reports a h/o knee and spine arthritis which could contribute along with overall debilitation to her decreased ambulation.    She'll likely need SNF placement for acute rehab.  Wylene Simmer 09/23/2013, 8:22 AM

## 2013-09-23 NOTE — Progress Notes (Signed)
Chart reviewed.  D/w RN who is concerned about low UOP.   [facility] PROGRESS NOTE  Shawna Fuentes TDV:[id number] DOB: 03/03/1933 DOA: 09/22/2013 PCP: Gwendolyn Grant, MD  Assessment/Plan:  Active Problems:   Hyperlipidemia   Hypertension   Chronic radicular low back pain   Osteoarthritis of right knee   Fracture, subtrochanteric, right femur, closed   Acute blood loss anemia: better after transfusion Low UOP. Appears dry. Bolus saline   Code Status:  full Family Communication:  Multiple at bedside Disposition Plan:  SNF  HPI/Subjective: Pain controlled. Eating. No dyspnea  Objective: Filed Vitals:   09/23/13 0651  BP: 101/50  Pulse: 80  Temp: 98.9 F (37.2 C)  Resp: 16    Intake/Output Summary (Last 24 hours) at 09/23/13 1238 Last data filed at 09/23/13 0626  Gross per 24 hour  Intake   4535 ml  Output   1780 ml  Net   2755 ml   Filed Weights   09/22/13 0843  Weight: 123.378 kg (272 lb)    Exam:   General:  Alert, oriented, obese  Cardiovascular: RRR without MGR  Respiratory: CTA without WRR  Abdomen: S, NT, ND  Ext: edema present  Basic Metabolic Panel:  Recent Labs Lab 09/22/13 1110 09/22/13 1606 09/22/13 1746 09/23/13 0630  NA 142 139 139 139  K 4.2 3.7 3.9 4.7  CL 102  --   --  103  CO2 26  --   --  23  GLUCOSE 112* 157* 175* 124*  BUN 35*  --   --  36*  CREATININE 0.86  --   --  1.13*  CALCIUM 9.8  --   --  8.9   Liver Function Tests: No results found for this basename: AST, ALT, ALKPHOS, BILITOT, PROT, ALBUMIN,  in the last 168 hours No results found for this basename: LIPASE, AMYLASE,  in the last 168 hours No results found for this basename: AMMONIA,  in the last 168 hours CBC:  Recent Labs Lab 09/22/13 1110 09/22/13 1606 09/22/13 1746 09/23/13 0630  WBC 10.0  --   --  12.8*  NEUTROABS 8.0*  --   --   --   HGB 10.8* 8.8* 6.5* 8.6*  HCT 33.6* 26.0* 19.0* 25.4*  MCV 87.3  --   --  83.6  PLT 180  --   --  130*    Cardiac Enzymes:  Recent Labs Lab 09/17/13 1437  CKTOTAL 198*   BNP (last 3 results) No results found for this basename: PROBNP,  in the last 8760 hours CBG:  Recent Labs Lab 09/22/13 2022 09/22/13 2213 09/23/13 0653  GLUCAP 146* 119* 123*    No results found for this or any previous visit (from the past 240 hour(s)).   Studies: Dg Pelvis 1-2 Views  09/22/2013   CLINICAL DATA:  Anterior right thigh pain, fell last night  EXAM: PELVIS - 1-2 VIEW  COMPARISON:  None  FINDINGS: Displaced oblique fracture proximal right femoral diaphysis as reported on right femoral radiographs.  Degenerative changes of the hip joints, advanced on left.  Diffuse osseous demineralization.  Pelvis intact.  No additional fracture or dislocation.  IMPRESSION: Displaced oblique fracture at the proximal right femoral diaphysis.  Osseous demineralization with advanced degenerative changes of the left hip joint.   Electronically Signed   By: Lavonia Dana M.D.   On: 09/22/2013 10:20   Dg Femur Right  09/22/2013   CLINICAL DATA:  ORIF right femur fracture  EXAM: DG  C-ARM GT 120 MIN; RIGHT FEMUR - 2 VIEW  TECHNIQUE: Six fluoroscopic spot views provided  FLUOROSCOPY TIME:  3 min 47 seconds  COMPARISON:  DG FEMUR*R* dated 09/22/2013  FINDINGS: Intra medullary nail fixation of the right sub trochanteric femur fracture. Two cortical locking screws noted distally. No complication  IMPRESSION: No complication following intra medullary nail fixation of right femur fracture   Electronically Signed   By: Suzy Bouchard M.D.   On: 09/22/2013 18:28   Dg Femur Right  09/22/2013   CLINICAL DATA:  Anterior right thigh pain, fell last night  EXAM: RIGHT FEMUR - 2 VIEW  COMPARISON:  None  FINDINGS: Diffuse osseous demineralization.  Oblique fracture of the proximal right femoral diaphysis, displaced laterally posteriorly with mild overriding.  Degenerative changes of right hip joint and right knee joint.  No additional fracture or  dislocation.  IMPRESSION: Displaced oblique proximal right femoral diaphyseal fracture.  Osseous demineralization with degenerative changes right knee and right hip.   Electronically Signed   By: Lavonia Dana M.D.   On: 09/22/2013 10:19   Dg Chest Portable 1 View  09/22/2013   CLINICAL DATA:  FALL LEG PAIN  EXAM: PORTABLE CHEST - 1 VIEW  COMPARISON:  None.  FINDINGS: Low lung volumes. Cardiac silhouette is mild-to-moderately enlarged. Increased confluent density projects within the right peritracheal region. The aorta is tortuous and ectatic. The lungs are clear. Degenerative changes appreciated within the right and left shoulders.  IMPRESSION: Low lung volumes. Increased confluent density in the right paratracheal region. Repeat PA and lateral views of the chest with deeper inspiration is recommended. A mass or consolidative infiltrate in the right peritracheal region cannot be excluded.   Electronically Signed   By: Margaree Mackintosh M.D.   On: 09/22/2013 12:12   Dg C-arm Gt 120 Min  09/22/2013   CLINICAL DATA:  ORIF right femur fracture  EXAM: DG C-ARM GT 120 MIN; RIGHT FEMUR - 2 VIEW  TECHNIQUE: Six fluoroscopic spot views provided  FLUOROSCOPY TIME:  3 min 47 seconds  COMPARISON:  DG FEMUR*R* dated 09/22/2013  FINDINGS: Intra medullary nail fixation of the right sub trochanteric femur fracture. Two cortical locking screws noted distally. No complication  IMPRESSION: No complication following intra medullary nail fixation of right femur fracture   Electronically Signed   By: Suzy Bouchard M.D.   On: 09/22/2013 18:28    Scheduled Meds: . acetaminophen  1,000 mg Oral 4 times per day  . atorvastatin  10 mg Oral QPM  . docusate sodium  100 mg Oral BID  . enoxaparin (LOVENOX) injection  40 mg Subcutaneous Q24H  . ferrous sulfate  325 mg Oral TID PC  . irbesartan  150 mg Oral Daily   And  . hydrochlorothiazide  25 mg Oral Daily  . insulin aspart  0-9 Units Subcutaneous TID WC  . insulin aspart  4  Units Subcutaneous TID WC  . metoprolol succinate  100 mg Oral Daily  . senna  2 tablet Oral BID  . sulindac  200 mg Oral BID WC   Continuous Infusions:   Time spent: 25 minutes  [facility] Pager [phone]. If 7PM-7AM, please contact night-coverage at [email or web address], password [facility] 09/23/2013, 12:38 PM  LOS: 1 day

## 2013-09-23 NOTE — Progress Notes (Signed)
Uop concerns discussed with Dr Ricard Dillon on rounds this am. Patient shows only 60 cc dk amber uop from 0800 this am when spoke with MD. MD requested to be phoned if no improvement after regularly scheduled diuretics given this am. HCTZ given at 0900, without improvement. Answering service phoned with PA on call to call back for further discussion regarding this problem.

## 2013-09-23 NOTE — Progress Notes (Signed)
Dr Conley Canal paged regarding to increase in uop post fluid bolus. Foley remains clamped for ua/c&s, inadequate amount to send for test at present.

## 2013-09-23 NOTE — Progress Notes (Signed)
New Orders from Dr Rolena Infante PA- Obtain ua/c&s call with results

## 2013-09-23 NOTE — Evaluation (Signed)
Physical Therapy Evaluation Patient Details Name: Shawna Fuentes MRN: [id number] DOB: Dec 16, 1932 Today's Date: 09/23/2013 Time: 5093-2671 PT Time Calculation (min): 20 min  PT Assessment / Plan / Recommendation History of Present Illness  Open treatment of right subtrochanteric femur fracture with INTRAMEDULLARY (IM) NAIL FEMORAL (Right)  Clinical Impression  Patient is s/p above surgery and back and LLE pain resulting in functional limitations due to the deficits listed below (see PT Problem List).  Patient will benefit from skilled PT to increase their independence and safety with mobility to allow discharge to the venue listed below.       PT Assessment  Patient needs continued PT services    Follow Up Recommendations  SNF    Does the patient have the potential to tolerate intense rehabilitation      Barriers to Discharge Decreased caregiver support must be mod indeendent to go home    Equipment Recommendations  Rolling walker with 5" wheels;Wheelchair (measurements PT);Wheelchair cushion (measurements PT)    Recommendations for Other Services     Frequency Min 3X/week    Precautions / Restrictions Precautions Precautions: Knee Restrictions RLE Weight Bearing: Weight bearing as tolerated   Pertinent Vitals/Pain 10+/10 pain with movement, especially in R hip patient repositioned for comfort RN provided medication to assist with pain control prior to session      Mobility  Bed Mobility Overal bed mobility: Needs Assistance;+2 for physical assistance Bed Mobility: Supine to Sit Supine to sit: +2 for physical assistance;Total assist General bed mobility comments: Step-by-step cues for scooting body to EOB; Total assist to elevate shoulders and trunk from bed; Heavy use of bed pad to scoot hips to EOB once sitting; Total assist to help LEs back into to bed to lay down    Exercises     PT Diagnosis: Difficulty walking;Generalized weakness;Acute pain  PT Problem List:  Decreased strength;Decreased range of motion;Decreased activity tolerance;Decreased balance;Decreased mobility;Decreased coordination;Decreased cognition;Decreased knowledge of use of DME;Decreased safety awareness;Decreased knowledge of precautions;Pain;Obesity PT Treatment Interventions: DME instruction;Gait training;Functional mobility training;Therapeutic activities;Therapeutic exercise;Balance training;Neuromuscular re-education;Cognitive remediation;Patient/family education;Wheelchair mobility training     PT Goals(Current goals can be found in the care plan section) Acute Rehab PT Goals Patient Stated Goal: feel better PT Goal Formulation: With patient Time For Goal Achievement: 10/07/13 Potential to Achieve Goals: Good  Visit Information  Last PT Received On: 09/23/13 Assistance Needed: +2 History of Present Illness: Open treatment of right subtrochanteric femur fracture with INTRAMEDULLARY (IM) NAIL FEMORAL (Right)       Prior [place] expects to be discharged to:: Skilled nursing facility Living Arrangements: Children Prior Function Level of Independence: Independent with assistive device(s) (though reported difficulty walking for a few weeks) Communication Communication: No difficulties    Cognition  Cognition Arousal/Alertness: Awake/alert Behavior During Therapy: WFL for tasks assessed/performed Overall Cognitive Status: Within Functional Limits for tasks assessed    Extremity/Trunk Assessment Upper Extremity Assessment Upper Extremity Assessment: Defer to OT evaluation (Noted decr Antigravity movement R shoulder against gravity) Lower Extremity Assessment Lower Extremity Assessment: RLE deficits/detail;LLE deficits/detail RLE Deficits / Details: Hip motion limited by pain RLE: Unable to fully assess due to pain LLE Deficits / Details: Difficulty moving L hip as well, more likely due to weakness and body habitus   Balance  Balance Overall balance assessment: Needs assistance Sitting-balance support: Bilateral upper extremity supported Sitting balance-Leahy Scale: Zero Sitting balance - Comments: Sat with trunk flexed and posterior lean with posterior pelvic tilt initially; posture improved with time  and cues to where pt only required minguard assist1 Postural control: Posterior lean General Comments General comments (skin integrity, edema, etc.): Ended session in bed as pt is to go to radiology for MRI and other imaging  End of Session PT - End of Session Activity Tolerance: Patient limited by pain Patient left: in bed;with call bell/phone within reach;with nursing/sitter in room;with family/visitor present Nurse Communication: Mobility status  GP     [place] 09/23/2013, 12:40 PM Roney Marion, [place] Pager [phone] Office [phone]

## 2013-09-23 NOTE — Progress Notes (Addendum)
Clinical Social Work Department CLINICAL SOCIAL WORK PLACEMENT NOTE 09/23/2013  Patient:  Shawna Fuentes, Shawna Fuentes  Account Number:  [id number] Admit date:  09/22/2013  Clinical Social Worker:  Blima Rich, Latanya Presser  Date/time:  09/23/2013 04:53 PM  Clinical Social Work is seeking post-discharge placement for this patient at the following level of care:   [place]   (*CSW will update this form in Epic as items are completed)   09/23/2013  Patient/family provided with [facility] Department of Clinical Social Work's list of facilities offering this level of care within the geographic area requested by the patient (or if unable, by the patient's family).  09/23/2013  Patient/family informed of their freedom to choose among providers that offer the needed level of care, that participate in Medicare, Medicaid or managed care program needed by the patient, have an available bed and are willing to accept the patient.  09/23/2013  Patient/family informed of [facility]' ownership interest in [facility], as well as of the fact that they are under no obligation to receive care at this facility.  PASARR submitted to EDS on 09/23/2013 PASARR number received from EDS on 09/23/2013  FL2 transmitted to all facilities in geographic area requested by pt/family on  09/23/2013 FL2 transmitted to all facilities within larger geographic area on   Patient informed that his/her managed care company has contracts with or will negotiate with  certain facilities, including the following:     Patient/family informed of bed offers received:  09/24/2013 Patient chooses bed at [facility] Physician recommends and patient chooses bed at    Patient to be transferred to [facility] on 09/25/2013 Patient to be transferred to facility by [facility]  The following physician request were entered in Epic:   Additional Comments:

## 2013-09-24 ENCOUNTER — Inpatient Hospital Stay (HOSPITAL_COMMUNITY): Payer: Medicare HMO

## 2013-09-24 DIAGNOSIS — N289 Disorder of kidney and ureter, unspecified: Secondary | ICD-10-CM | POA: Diagnosis not present

## 2013-09-24 LAB — GLUCOSE, CAPILLARY
GLUCOSE-CAPILLARY: 95 mg/dL (ref 70–99)
GLUCOSE-CAPILLARY: 102 mg/dL — AB (ref 70–99)
Glucose-Capillary: 104 mg/dL — ABNORMAL HIGH (ref 70–99)
Glucose-Capillary: 110 mg/dL — ABNORMAL HIGH (ref 70–99)

## 2013-09-24 LAB — CBC
HCT: 25.4 % — ABNORMAL LOW (ref 36.0–46.0)
HEMATOCRIT: 22.6 % — AB (ref 36.0–46.0)
HEMOGLOBIN: 7.5 g/dL — AB (ref 12.0–15.0)
Hemoglobin: 8.6 g/dL — ABNORMAL LOW (ref 12.0–15.0)
MCH: 28.3 pg (ref 26.0–34.0)
MCH: 28.2 pg (ref 26.0–34.0)
MCHC: 33.9 g/dL (ref 30.0–36.0)
MCHC: 33.2 g/dL (ref 30.0–36.0)
MCV: 83.6 fL (ref 78.0–100.0)
MCV: 85 fL (ref 78.0–100.0)
PLATELETS: 130 10*3/uL — AB (ref 150–400)
Platelets: 127 10*3/uL — ABNORMAL LOW (ref 150–400)
RBC: 3.04 MIL/uL — AB (ref 3.87–5.11)
RBC: 2.66 MIL/uL — AB (ref 3.87–5.11)
RDW: 15.7 % — AB (ref 11.5–15.5)
RDW: 15.5 % (ref 11.5–15.5)
WBC: 12.8 10*3/uL — ABNORMAL HIGH (ref 4.0–10.5)
WBC: 13.4 10*3/uL — AB (ref 4.0–10.5)

## 2013-09-24 LAB — BASIC METABOLIC PANEL
BUN: 42 mg/dL — AB (ref 6–23)
CALCIUM: 8.2 mg/dL — AB (ref 8.4–10.5)
CHLORIDE: 105 meq/L (ref 96–112)
CO2: 23 meq/L (ref 19–32)
CREATININE: 1.51 mg/dL — AB (ref 0.50–1.10)
GFR calc Af Amer: 37 mL/min — ABNORMAL LOW (ref >90)
GFR calc non Af Amer: 32 mL/min — ABNORMAL LOW (ref >90)
GLUCOSE: 96 mg/dL (ref 70–99)
Potassium: 4.6 mEq/L (ref 3.7–5.3)
Sodium: 139 mEq/L (ref 137–147)

## 2013-09-24 LAB — URINE CULTURE
COLONY COUNT: NO GROWTH
Culture: NO GROWTH

## 2013-09-24 MED ORDER — SODIUM CHLORIDE 0.9 % IV SOLN
INTRAVENOUS | Status: DC
  Administered 2013-09-24: 08:00:00 via INTRAVENOUS

## 2013-09-24 NOTE — Care Management Note (Signed)
CARE MANAGEMENT NOTE 09/24/2013  Patient:  Shawna Fuentes, Shawna Fuentes   Account Number:  [id number]  Date Initiated:  09/24/2013  Documentation initiated by:  Ricki Miller  Subjective/Objective Assessment:   80 yr old female s/p right femur fracture with IM Nailing.     Action/Plan:   patient is for shortterm rehab at [facility]. Social worker is aware.   Anticipated DC Date:  09/25/2013   Anticipated DC Plan:  SKILLED NURSING FACILITY  In-house referral  Clinical Social Worker      DC Planning Services  CM consult      Choice offered to / List presented to:             Status of service:  Completed, signed off Medicare Important Message given?   (If response is "NO", the following Medicare IM given date fields will be blank) Date Medicare IM given:   Date Additional Medicare IM given:    Discharge Disposition:  [place]

## 2013-09-24 NOTE — Progress Notes (Signed)
Physical Therapy Treatment Patient Details Name: Shawna Fuentes MRN: [id number] DOB: 17-Mar-1933 Today's Date: 09/24/2013 Time: 1443-1510 PT Time Calculation (min): 27 min  PT Assessment / Plan / Recommendation  History of Present Illness Open treatment of right subtrochanteric femur fracture with INTRAMEDULLARY (IM) NAIL FEMORAL (Right)   PT Comments   Noted  progress with functional mobility as pt was able to transfer OOB to drop-arm recliner with +2 assist Continue to recommend SNF stay for postacute rehab   Follow Up Recommendations  SNF     Does the patient have the potential to tolerate intense rehabilitation     Barriers to Discharge        Equipment Recommendations  Rolling walker with 5" wheels;Wheelchair (measurements PT);Wheelchair cushion (measurements PT)    Recommendations for Other Services    Frequency Min 3X/week   Progress towards PT Goals Progress towards PT goals: Progressing toward goals  Plan Current plan remains appropriate    Precautions / Restrictions Precautions Precautions: Knee Restrictions RLE Weight Bearing: Weight bearing as tolerated   Pertinent Vitals/Pain 7-8/10 during transfer 5-6/10 in recliner post transfer patient repositioned for comfort     Mobility  Bed Mobility Overal bed mobility: Needs Assistance;+2 for physical assistance Bed Mobility: Supine to Sit Supine to sit: +2 for physical assistance;Total assist General bed mobility comments: Step-by-step cues for scooting body to EOB; Total assist to elevate shoulders and trunk from bed; Heavy use of bed pad to scoot hips to EOB once sitting; Cues and assist with lateral lean and hip hike for sliding board placement Transfers Overall transfer level: Needs assistance Equipment used:  (slididng board) Transfers: Lateral/Scoot Transfers  Lateral/Scoot Transfers: +2 physical assistance;Max assist;With slide board General transfer comment: Cues for technique; Overall smooth slide to  drop-arm recliner, but pt unable to assist much, with decr use of RUE for pulling on armrest; painful transfer, with grimace noted    Exercises     PT Diagnosis:    PT Problem List:   PT Treatment Interventions:     PT Goals (current goals can now be found in the care plan section) Acute Rehab PT Goals Patient Stated Goal: feel better PT Goal Formulation: With patient Time For Goal Achievement: 10/07/13 Potential to Achieve Goals: Good  Visit Information  Last PT Received On: 09/24/13 Assistance Needed: +2 History of Present Illness: Open treatment of right subtrochanteric femur fracture with INTRAMEDULLARY (IM) NAIL FEMORAL (Right)    Subjective Data  Patient Stated Goal: feel better   Cognition  Cognition Arousal/Alertness: Awake/alert Behavior During Therapy: WFL for tasks assessed/performed Overall Cognitive Status: Within Functional Limits for tasks assessed    Balance     End of Session PT - End of Session Equipment Utilized During Treatment: Other (comment);Gait belt (sliding board) Activity Tolerance: Patient limited by pain;Patient tolerated treatment well Patient left: with call bell/phone within reach;with family/visitor present;in chair Nurse Communication: Mobility status;Other (comment) (Strategy for helping pt back to bed)   GP     Roney Marion [facility] 09/24/2013, 4:46 PM Roney Marion, [place] Pager [phone] Office [phone]

## 2013-09-24 NOTE — Progress Notes (Signed)
[facility] PROGRESS NOTE  Shawna Fuentes XNA:[id number] DOB: 19-Sep-1932 DOA: 09/22/2013 PCP: Gwendolyn Grant, MD  Assessment/Plan:   Fracture, subtrochanteric, right femur, closed, s/p IM nail   Acute blood loss anemia: s/p 2 units pRBC. May need another unit PRBC. Monitor.   Hyperlipidemia   Hypertension   Chronic radicular low back pain   Osteoarthritis of right knee Acute renal insufficiency: hold hctz, diovan. Continue IVF. D/c clinoril.   Code Status:  full Family Communication:   Disposition Plan:  SNF  HPI/Subjective: C/o pain . Some nausea  Objective: Filed Vitals:   09/24/13 1300  BP: 115/49  Pulse: 100  Temp: 100.3 F (37.9 C)  Resp: 18    Intake/Output Summary (Last 24 hours) at 09/24/13 1625 Last data filed at 09/24/13 1500  Gross per 24 hour  Intake    240 ml  Output    550 ml  Net   -310 ml   Filed Weights   09/22/13 0843  Weight: 123.378 kg (272 lb)    Exam:   General:  Alert, oriented, in chair  Cardiovascular: RRR without MGR  Respiratory: CTA without WRR  Abdomen: S, NT, ND  Ext: edema present  Basic Metabolic Panel:  Recent Labs Lab 09/22/13 1110 09/22/13 1606 09/22/13 1746 09/23/13 0630 09/24/13 0511  NA 142 139 139 139 139  K 4.2 3.7 3.9 4.7 4.6  CL 102  --   --  103 105  CO2 26  --   --  23 23  GLUCOSE 112* 157* 175* 124* 96  BUN 35*  --   --  36* 42*  CREATININE 0.86  --   --  1.13* 1.51*  CALCIUM 9.8  --   --  8.9 8.2*   Liver Function Tests: No results found for this basename: AST, ALT, ALKPHOS, BILITOT, PROT, ALBUMIN,  in the last 168 hours No results found for this basename: LIPASE, AMYLASE,  in the last 168 hours No results found for this basename: AMMONIA,  in the last 168 hours CBC:  Recent Labs Lab 09/22/13 1110 09/22/13 1606 09/22/13 1746 09/23/13 0630 09/24/13 0511  WBC 10.0  --   --  12.8* 13.4*  NEUTROABS 8.0*  --   --   --   --   HGB 10.8* 8.8* 6.5* 8.6* 7.5*  HCT 33.6* 26.0* 19.0*  25.4* 22.6*  MCV 87.3  --   --  83.6 85.0  PLT 180  --   --  130* 127*   Cardiac Enzymes: No results found for this basename: CKTOTAL, CKMB, CKMBINDEX, TROPONINI,  in the last 168 hours BNP (last 3 results) No results found for this basename: PROBNP,  in the last 8760 hours CBG:  Recent Labs Lab 09/23/13 1643 09/23/13 2128 09/24/13 0713 09/24/13 1126 09/24/13 1605  GLUCAP 113* 107* 95 104* 102*    No results found for this or any previous visit (from the past 240 hour(s)).   Studies: Dg Pelvis 1-2 Views  09/24/2013   CLINICAL DATA:  Leg pain.  EXAM: PELVIS - 1-2 VIEW  COMPARISON:  DG C-ARM GT 120 MIN dated 09/22/2013; DG PELVIS 1-2 VIEWS dated 09/22/2013  FINDINGS: Intra medullary rod and screw fix a right femoral fracture. Severe degenerative changes left hip, stable. Degenerative changes lumbar spine. Diffuse osteopenia .  IMPRESSION: 1. Patient status post open reduction internal fixation right proximal femoral fracture. 2. Severe stable left hip degenerative change .   Electronically Signed   By: Marcello Moores  Register  On: 09/24/2013 07:38   Dg Femur Right  09/24/2013   CLINICAL DATA:  Fracture.  EXAM: RIGHT FEMUR - 2 VIEW  COMPARISON:  None.  FINDINGS: Intra medullary rod fixation of proximal right femoral fracture noted. Fracture is comminuted and angulated slightly. Findings are stable from prior exam. Intra medullary rod is in good anatomic position. Degenerative changes right knee .  IMPRESSION: Stable exam with intra medullary rod fixation of proximal right femoral fracture.   Electronically Signed   By: Marcello Moores  Register   On: 09/24/2013 07:36   Dg Femur Right  09/22/2013   CLINICAL DATA:  ORIF right femur fracture  EXAM: DG C-ARM GT 120 MIN; RIGHT FEMUR - 2 VIEW  TECHNIQUE: Six fluoroscopic spot views provided  FLUOROSCOPY TIME:  3 min 47 seconds  COMPARISON:  DG FEMUR*R* dated 09/22/2013  FINDINGS: Intra medullary nail fixation of the right sub trochanteric femur fracture. Two  cortical locking screws noted distally. No complication  IMPRESSION: No complication following intra medullary nail fixation of right femur fracture   Electronically Signed   By: Suzy Bouchard M.D.   On: 09/22/2013 18:28   Dg Foot Complete Left  09/24/2013   CLINICAL DATA:  Pain.  EXAM: LEFT FOOT - COMPLETE 3+ VIEW  COMPARISON:  None.  FINDINGS: Diffuse osteopenia and degenerative change.  No acute abnormality .  IMPRESSION: Diffuse osteopenia degenerative change.  No acute abnormality.   Electronically Signed   By: Marcello Moores  Register   On: 09/24/2013 07:40   Dg C-arm Gt 120 Min  09/22/2013   CLINICAL DATA:  ORIF right femur fracture  EXAM: DG C-ARM GT 120 MIN; RIGHT FEMUR - 2 VIEW  TECHNIQUE: Six fluoroscopic spot views provided  FLUOROSCOPY TIME:  3 min 47 seconds  COMPARISON:  DG FEMUR*R* dated 09/22/2013  FINDINGS: Intra medullary nail fixation of the right sub trochanteric femur fracture. Two cortical locking screws noted distally. No complication  IMPRESSION: No complication following intra medullary nail fixation of right femur fracture   Electronically Signed   By: Suzy Bouchard M.D.   On: 09/22/2013 18:28    Scheduled Meds: . atorvastatin  10 mg Oral QPM  . docusate sodium  100 mg Oral BID  . enoxaparin (LOVENOX) injection  40 mg Subcutaneous Q24H  . ferrous sulfate  325 mg Oral TID PC  . insulin aspart  0-9 Units Subcutaneous TID WC  . insulin aspart  4 Units Subcutaneous TID WC  . metoprolol succinate  100 mg Oral Daily  . senna  2 tablet Oral BID   Continuous Infusions: . sodium chloride      Time spent: 25 minutes  [facility] Pager [phone]. If 7PM-7AM, please contact night-coverage at [email or web address], password [facility] 09/24/2013, 4:25 PM  LOS: 2 days

## 2013-09-24 NOTE — Progress Notes (Addendum)
Subjective: 2 Days Post-Op Procedure(s) (LRB): INTRAMEDULLARY (IM) NAIL FEMORAL (Right) Patient doing well this AM.  Able to stand at bedside.  Pain well controlled, however pt not feeling hungry this AM and did not eat breakfast.  Pt stated she had mild SOB earlier this AM but feels better now. Pt denies N/V/F/C, chest pain, calf pain, or paresthesia b/l.  Objective: Vital signs in last 24 hours: Temp:  [98.2 F (36.8 C)-99.4 F (37.4 C)] 98.9 F (37.2 C) (03/23 0714) Pulse Rate:  [80-96] 80 (03/23 0714) Resp:  [16-18] 18 (03/23 0714) BP: (108-122)/(34-42) 122/41 mmHg (03/23 0714) SpO2:  [99 %-100 %] 99 % (03/23 0714)  Intake/Output from previous day:   Intake/Output this shift: Total I/O In: 240 [P.O.:240] Out: -    Recent Labs  09/22/13 1110 09/22/13 1606 09/22/13 1746 09/23/13 0630 09/24/13 0511  HGB 10.8* 8.8* 6.5* 8.6* 7.5*    Recent Labs  09/23/13 0630 09/24/13 0511  WBC 12.8* 13.4*  RBC 3.04* 2.66*  HCT 25.4* 22.6*  PLT 130* 127*    Recent Labs  09/23/13 0630 09/24/13 0511  NA 139 139  K 4.7 4.6  CL 103 105  CO2 23 23  BUN 36* 42*  CREATININE 1.13* 1.51*  GLUCOSE 124* 96  CALCIUM 8.9 8.2*   No results found for this basename: LABPT, INR,  in the last 72 hours  Obese and WN 80 y/o female in NAD, A/Ox3, appears stated age.  Mood and affect normal, EOMI, respirations unlabored.  Dressing is C/D/I, compartments soft, negative Homan's sign.  Lower extremities are NVI with good motor control of plantar/dorsiflexion of great toes and feet b/l.   Assessment/Plan: 2 Days Post-Op Procedure(s) (LRB): INTRAMEDULLARY (IM) NAIL FEMORAL (Right) Continue Lovenox for anticoagulation Continue PT/OT Continue WBAT SNF when bed available. MRI results pending for low bac k pain and LE weakness.  FLOWERS, CHRISTOPHER S 09/24/2013, 8:06 AM

## 2013-09-24 NOTE — Progress Notes (Signed)
Occupational Therapy Evaluation Patient Details Name: JOZIE WULF MRN: [id number] DOB: Nov 16, 1932 Today's Date: 09/24/2013 Time: 1010-1039 OT Time Calculation (min): 29 min  OT Assessment / Plan / Recommendation History of present illness Open treatment of right subtrochanteric femur fracture with INTRAMEDULLARY (IM) NAIL FEMORAL (Right)   Clinical Impression   PTA pt lived at home with her daughter and granddaughter and was independent in ADLs and mod I for mobility. However, pt states it was difficult to walk for the past few weeks. Pt with severely decreased activity tolerance. Pt sat EOB with +2 physical assistance, total assist and was unable to complete full sit>stand transfer. Pt with UE weakness, especially in R shoulder, and not able to push through walker to help with transfer. Pt would benefit from continued skilled OT to address ADLs and energy conservation and to increase activity tolerance.     OT Assessment  Patient needs continued OT Services    Follow Up Recommendations  SNF;Supervision/Assistance - 24 hour    Barriers to Discharge Other (comment) (Pt with daughter at home to help, but level of care required for pt is too great for daughter)    Equipment Recommendations  Other (comment) (TBD by SNF )       Frequency  Min 2X/week    Precautions / Restrictions Precautions Precautions: Knee Restrictions Weight Bearing Restrictions: Yes RLE Weight Bearing: Weight bearing as tolerated   Pertinent Vitals/Pain 8/10, RN notified and administered pain medication. Pt repositioned.    ADL  Eating/Feeding: Set up Where Assessed - Eating/Feeding: Chair Grooming: Supervision/safety;Set up Where Assessed - Grooming: Supported sitting Upper Body Bathing: Moderate assistance Where Assessed - Upper Body Bathing: Supported sitting Lower Body Bathing: +1 Total assistance Where Assessed - Lower Body Bathing: Supported sitting Upper Body Dressing: Maximal assistance Where  Assessed - Upper Body Dressing: Supported sitting Lower Body Dressing: +2 Total assistance Lower Body Dressing: Patient Percentage: 10% Where Assessed - Lower Body Dressing: Supported sitting Equipment Used: Gait belt;Rolling walker Transfers/Ambulation Related to ADLs: Pt sat edge of bed with +2 physical assistance Total assist. Attempted to stand and pt bearing weight through legs, but became afraid and required transfer back to bed. Pt holding onto walker while sitting back down with several VC's to release walker.  ADL Comments: Pt with decreased endurance and RUE deficits. Gross weakness in UE but able to perform eating and grooming tasks in supported sitting.     OT Diagnosis: Generalized weakness;Acute pain  OT Problem List: Decreased strength;Decreased range of motion;Decreased activity tolerance;Impaired balance (sitting and/or standing);Decreased safety awareness;Decreased knowledge of use of DME or AE;Decreased knowledge of precautions;Pain OT Treatment Interventions: Self-care/ADL training;Therapeutic exercise;Energy conservation;DME and/or AE instruction;Therapeutic activities;Patient/family education;Balance training   OT Goals(Current goals can be found in the care plan section) Acute Rehab OT Goals Patient Stated Goal: To walk and get stronger to go home OT Goal Formulation: With patient Time For Goal Achievement: 10/01/13 Potential to Achieve Goals: Good  Visit Information  Last OT Received On: 09/24/13 Assistance Needed: +2 History of Present Illness: Open treatment of right subtrochanteric femur fracture with INTRAMEDULLARY (IM) NAIL FEMORAL (Right)       Prior [place] expects to be discharged to:: Skilled nursing facility Living Arrangements: Children Prior Function Level of Independence: Independent with assistive device(s) (reported difficulty walking for a few weeks) Communication Communication: No difficulties          Vision/Perception Vision - History Patient Visual Report: No change from baseline  Cognition  Cognition Arousal/Alertness: Awake/alert Behavior During Therapy: WFL for tasks assessed/performed Overall Cognitive Status: Within Functional Limits for tasks assessed    Extremity/Trunk Assessment Upper Extremity Assessment Upper Extremity Assessment: RUE deficits/detail;LUE deficits/detail RUE Deficits / Details: 2/5 shoulder FF LUE Deficits / Details: 3/5 shoulder FF  Lower Extremity Assessment Lower Extremity Assessment: Defer to PT evaluation     Mobility Bed Mobility Overal bed mobility: Needs Assistance;+2 for physical assistance Bed Mobility: Supine to Sit Supine to sit: +2 for physical assistance;Total assist General bed mobility comments: Total assist +2 to move pt to edge of bed by scooting hips with bed pad. Total assist to help LEs onto floor. Pt unable to push up to sitting with UEs and required total assist to elevate shoulders and trunk from bed. Transfers Overall transfer level: Needs assistance Equipment used: Rolling walker (2 wheeled) Transfers: Sit to/from Stand Sit to Stand: +2 physical assistance;Total assist;From elevated surface (pt unable to perform complete transfer) General transfer comment: May consider use of lift device to get pt OOB           End of Session OT - End of Session Equipment Utilized During Treatment: Gait belt;Rolling walker Activity Tolerance: Patient limited by pain;Other (comment) (pt limited by weakness) Patient left: in bed;with call bell/phone within reach       Juluis Rainier [phone] 09/24/2013, 10:54 AM

## 2013-09-24 NOTE — ED Provider Notes (Signed)
Medical screening examination/treatment/procedure(s) were performed by non-physician practitioner and as supervising physician I was immediately available for consultation/collaboration.   EKG Interpretation   Date/Time:  Saturday September 22 2013 08:56:55 EDT Ventricular Rate:  75 PR Interval:  188 QRS Duration: 92 QT Interval:  360 QTC Calculation: 402 R Axis:   43 Text Interpretation:  Sinus or ectopic atrial rhythm No old tracing to  compare Confirmed by [facility]  MD, Nunzio Cory [phone]) on 09/22/2013 11:14:27  AM        Alfonzo Feller, DO 09/24/13 2041

## 2013-09-24 NOTE — Progress Notes (Signed)
Utilization review completed.  

## 2013-09-25 LAB — CBC
HCT: 22.9 % — ABNORMAL LOW (ref 36.0–46.0)
HEMOGLOBIN: 7.8 g/dL — AB (ref 12.0–15.0)
MCH: 29.3 pg (ref 26.0–34.0)
MCHC: 34.1 g/dL (ref 30.0–36.0)
MCV: 86.1 fL (ref 78.0–100.0)
Platelets: 139 10*3/uL — ABNORMAL LOW (ref 150–400)
RBC: 2.66 MIL/uL — ABNORMAL LOW (ref 3.87–5.11)
RDW: 15.4 % (ref 11.5–15.5)
WBC: 13.2 10*3/uL — ABNORMAL HIGH (ref 4.0–10.5)

## 2013-09-25 LAB — GLUCOSE, CAPILLARY
Glucose-Capillary: 103 mg/dL — ABNORMAL HIGH (ref 70–99)
Glucose-Capillary: 95 mg/dL (ref 70–99)

## 2013-09-25 LAB — BASIC METABOLIC PANEL
BUN: 35 mg/dL — ABNORMAL HIGH (ref 6–23)
CALCIUM: 8.5 mg/dL (ref 8.4–10.5)
CO2: 24 mEq/L (ref 19–32)
Chloride: 103 mEq/L (ref 96–112)
Creatinine, Ser: 0.97 mg/dL (ref 0.50–1.10)
GFR calc Af Amer: 63 mL/min — ABNORMAL LOW (ref >90)
GFR, EST NON AFRICAN AMERICAN: 55 mL/min — AB (ref >90)
Glucose, Bld: 97 mg/dL (ref 70–99)
Potassium: 4.6 mEq/L (ref 3.7–5.3)
SODIUM: 138 meq/L (ref 137–147)

## 2013-09-25 MED ORDER — ENOXAPARIN SODIUM 40 MG/0.4ML ~~LOC~~ SOLN: 40.0000 mg | SUBCUTANEOUS | Status: DC

## 2013-09-25 MED ORDER — OXYCODONE HCL 5 MG PO TABS: 5.0000 mg | ORAL_TABLET | ORAL | Status: DC | PRN

## 2013-09-25 NOTE — Discharge Summary (Signed)
Physician Discharge Summary  Patient ID: Shawna Fuentes MRN: [id number] DOB/AGE: 78-Feb-1934 78 y.o.  Admit date: 09/22/2013 Discharge date: 09/25/2013  Admission Diagnoses: Subtrochanteric fracture of the right femur  Discharge Diagnoses: same Active Problems:   Hyperlipidemia   Hypertension   Chronic radicular low back pain   Osteoarthritis of right knee   Fracture, subtrochanteric, right femur, closed   Acute blood loss anemia   Acute renal insufficiency   Discharged Condition: good  Hospital Course: On 09/22/2013 pt fell causing injury to her right hip and was taken to [facility] ER were she was diagnosed with right subtrochanteric hip fracture. Pt was brought to OR by Dr. Wylene Simmer for intermedullary nailing of the right subtrochanteric femur fracture. Procedure was completed with no complications and pt was recovered in PACU and then transferred to the floor for further post operative care.  During her stay, pt underwent physical and occupational therapy and received Lovenox 40mg  subq for anticoagulation.  Pt progressed well and on 09/25/2013 all vital signs were stable and pt was appropriate for discharge to SNF.  Prognosis for the pt is good and she will follow up with Dr. Doran Durand in 2 weeks.  Consults: None  Significant Diagnostic Studies: none  Treatments: surgery: as stated above  Discharge Exam: Blood pressure 125/60, pulse 84, temperature 98.7 F (37.1 C), temperature source Oral, resp. rate 18, height 5\' 2"  (1.575 m), weight 123.378 kg (272 lb), SpO2 99.00%. WD WN 80y/o female in NAD, A/Ox3, appears stated age.  EOMI, mood and affect normal, respirations unlabored. On physical exam incisions C/D/I, compartments soft, negative Homan's sign.  Lower extremities are NVI with good motor control of plantar/dorsiflexion of great toes and feet b/l. No evidence of infection or compartment syndrome noted.  Disposition: Final discharge disposition not confirmed  Discharge Orders   Future Appointments Provider Department Dept Phone   10/19/2013 10:00 AM Rowe Clack, MD [place] [phone]   Future Orders Complete By Expires   Call MD / Call 911  As directed    Comments:     If you experience chest pain or shortness of breath, CALL 911 and be transported to the hospital emergency room.  If you develope a fever above 101 F, pus (white drainage) or increased drainage or redness at the wound, or calf pain, call your surgeon's office.   Constipation Prevention  As directed    Comments:     Drink plenty of fluids.  Prune juice may be helpful.  You may use a stool softener, such as Colace (over the counter) 100 mg twice a day.  Use MiraLax (over the counter) for constipation as needed.   Diet - low sodium heart healthy  As directed    Driving restrictions  As directed    Comments:     No driving for 6 weeks   Increase activity slowly as tolerated  As directed    Lifting restrictions  As directed    Comments:     No lifting for 6 weeks       Medication List    STOP taking these medications       aspirin 81 MG tablet      TAKE these medications       atorvastatin 10 MG tablet  Commonly known as:  LIPITOR  Take 10 mg by mouth every evening.     enoxaparin 40 MG/0.4ML injection  Commonly known as:  LOVENOX  Inject 0.4 mLs (40 mg total)  into the skin daily.     metoprolol succinate 100 MG 24 hr tablet  Commonly known as:  TOPROL-XL  Take 100 mg by mouth every morning. Take with or immediately following a meal.     oxyCODONE 5 MG immediate release tablet  Commonly known as:  Oxy IR/ROXICODONE  Take 1-2 tablets (5-10 mg total) by mouth every 4 (four) hours as needed for severe pain or breakthrough pain ((for MODERATE breakthrough pain)).     sulindac 200 MG tablet  Commonly known as:  CLINORIL  Take 200 mg by mouth 2 (two) times daily.     traMADol 50 MG tablet  Commonly known as:  ULTRAM  Take 25-50 mg by mouth every 8  (eight) hours as needed for moderate pain.     valsartan-hydrochlorothiazide 160-25 MG per tablet  Commonly known as:  DIOVAN-HCT  Take 1 tablet by mouth every morning.           Follow-up Information   Follow up with HEWITT, Jenny Reichmann, MD. Schedule an appointment as soon as possible for a visit in 2 weeks. (For suture removal)    Specialty:  Orthopedic Surgery   Contact information:   [address] [place] [address] [phone]       Signed: Trula Ore 09/25/2013, 8:47 AM

## 2013-09-25 NOTE — Progress Notes (Signed)
CSW (Clinical Education officer, museum) prepared pt dc packet and placed with shadow chart. CSW arranged non-emergent ambulance transport 1pm. Pt, pt family, pt nurse, and facility informed. CSW signing off.   Hoffman, [place]

## 2013-09-25 NOTE — Progress Notes (Signed)
Subjective: 3 Days Post-Op Procedure(s) (LRB): INTRAMEDULLARY (IM) NAIL FEMORAL (Right) Patient doing well this AM. Pain well controlled, progressing well with physical therapy.  Pt denies N/V/F/C, chest pain, SOB, calf pain, or paresthesia b/l.   Objective: Vital signs in last 24 hours: Temp:  [98.7 F (37.1 C)-100.3 F (37.9 C)] 98.7 F (37.1 C) (03/24 0618) Pulse Rate:  [84-100] 84 (03/24 0618) Resp:  [18-20] 18 (03/24 0618) BP: (115-125)/(49-60) 125/60 mmHg (03/24 0618) SpO2:  [97 %-99 %] 99 % (03/24 0618)  Intake/Output from previous day: 03/23 0701 - 03/24 0700 In: 1315 [P.O.:240; I.V.:1075] Out: 1100 [Urine:1100] Intake/Output this shift:     Recent Labs  09/22/13 1606 09/22/13 1746 09/23/13 0630 09/24/13 0511 09/25/13 0625  HGB 8.8* 6.5* 8.6* 7.5* 7.8*    Recent Labs  09/24/13 0511 09/25/13 0625  WBC 13.4* 13.2*  RBC 2.66* 2.66*  HCT 22.6* 22.9*  PLT 127* 139*    Recent Labs  09/24/13 0511 09/25/13 0625  NA 139 138  K 4.6 4.6  CL 105 103  CO2 23 24  BUN 42* 35*  CREATININE 1.51* PENDING  GLUCOSE 96 97  CALCIUM 8.2* 8.5   No results found for this basename: LABPT, INR,  in the last 72 hours  WD WN 80y/o female in NAD, A/Ox3, appears stated age.  EOMI, mood and affect normal, respirations unlabored. On physical exam incisions C/D/I, compartments soft, negative Homan's sign.  Lower extremities are NVI with good motor control of plantar/dorsiflexion of great toes and feet b/l. No evidence of infection or compartment syndrome noted.  Assessment/Plan: 3 Days Post-Op Procedure(s) (LRB): INTRAMEDULLARY (IM) NAIL FEMORAL (Right) Discharge to SNF Continue Lovenox for anticoagulation Continue PT full weight bearing  F/u with Dr. Doran Durand 2 weeks  Chasey Dull S 09/25/2013, 8:35 AM

## 2013-09-25 NOTE — Discharge Instructions (Signed)
Wylene Simmer, MD [place]  Please read the following information regarding your care after surgery.  Medications  You only need a prescription for the narcotic pain medicine (ex. oxycodone, Percocet, Norco).  All of the other medicines listed below are available over the counter. X acetominophen (Tylenol) 650 mg every 4-6 hours as you need for minor pain X oxycodone as prescribed for moderate to severe pain   Narcotic pain medicine (ex. oxycodone, Percocet, Vicodin) will cause constipation.  To prevent this problem, take the following medicines while you are taking any pain medicine. X docusate sodium (Colace) 100 mg twice a day X senna (Senokot) 2 tablets twice a day  X To help prevent blood clots, take Lovenox 40mg  subq once a day for 11 days after surgery.  Switch to Aspirin 325mg  one tablet per day after Lovenox is completed.  Weight Bearing X Bear weight on the right lower extremity with assistance from nursing or physical therapy staff only.  Cast / Splint / Dressing X Have skilled nursing change dressing everyday   Swelling It is normal for you to have swelling where you had surgery.  To reduce swelling and pain, keep your toes above your nose for at least 3 days after surgery.  It may be necessary to keep your foot or leg elevated for several weeks.  If it hurts, it should be elevated.  Follow Up Call my office at [phone] when you are discharged from the hospital or surgery center to schedule an appointment to be seen two weeks after surgery.  Call my office at [phone] if you develop a fever >101.5 F, nausea, vomiting, bleeding from the surgical site or severe pain.

## 2013-09-27 ENCOUNTER — Emergency Department (HOSPITAL_COMMUNITY)
Admission: EM | Admit: 2013-09-27 | Discharge: 2013-09-28 | Disposition: A | Discharge status: Home / Self Care | Payer: Medicare HMO | Attending: Emergency Medicine | Admitting: Emergency Medicine

## 2013-09-27 ENCOUNTER — Encounter (HOSPITAL_COMMUNITY): Payer: Self-pay | Admitting: Emergency Medicine

## 2013-09-27 DIAGNOSIS — Z79899 Other long term (current) drug therapy: Secondary | ICD-10-CM | POA: Diagnosis not present

## 2013-09-27 DIAGNOSIS — Z87442 Personal history of urinary calculi: Secondary | ICD-10-CM | POA: Insufficient documentation

## 2013-09-27 DIAGNOSIS — E785 Hyperlipidemia, unspecified: Secondary | ICD-10-CM | POA: Insufficient documentation

## 2013-09-27 DIAGNOSIS — IMO0002 Reserved for concepts with insufficient information to code with codable children: Secondary | ICD-10-CM

## 2013-09-27 DIAGNOSIS — Y849 Medical procedure, unspecified as the cause of abnormal reaction of the patient, or of later complication, without mention of misadventure at the time of the procedure: Secondary | ICD-10-CM | POA: Insufficient documentation

## 2013-09-27 DIAGNOSIS — I10 Essential (primary) hypertension: Secondary | ICD-10-CM | POA: Diagnosis not present

## 2013-09-27 DIAGNOSIS — T8131XA Disruption of external operation (surgical) wound, not elsewhere classified, initial encounter: Secondary | ICD-10-CM | POA: Diagnosis present

## 2013-09-27 DIAGNOSIS — Z8739 Personal history of other diseases of the musculoskeletal system and connective tissue: Secondary | ICD-10-CM | POA: Insufficient documentation

## 2013-09-27 DIAGNOSIS — T8130XA Disruption of wound, unspecified, initial encounter: Secondary | ICD-10-CM

## 2013-09-27 NOTE — ED Notes (Signed)
EMS called to [place].  Found patient in bed post physical therapy.  Staff noticed that  Patient's surgical site had opened.  Patient had hip surgery on Saturday from a fall that occurred on Friday night

## 2013-09-28 DIAGNOSIS — T8131XA Disruption of external operation (surgical) wound, not elsewhere classified, initial encounter: Secondary | ICD-10-CM | POA: Diagnosis not present

## 2013-09-28 MED ORDER — LIDOCAINE-EPINEPHRINE 2 %-1:100000 IJ SOLN
20.0000 mL | Freq: Once | INTRAMUSCULAR | Status: AC
  Administered 2013-09-28: 20 mL

## 2013-09-28 NOTE — ED Notes (Signed)
Patient is alert and oriented x3.  PTAR was given DC instructions and follow up visit instructions.  She was DC via stretcher to nursing home.  V/S stable.  He was not showing any signs of distress on DC

## 2013-09-28 NOTE — Discharge Instructions (Signed)
The laceration has been repaired. Please take good care of the wound site.  Laceration Care, Adult A laceration is a cut or lesion that goes through all layers of the skin and into the tissue just beneath the skin. TREATMENT  Some lacerations may not require closure. Some lacerations may not be able to be closed due to an increased risk of infection. It is important to see your caregiver as soon as possible after an injury to minimize the risk of infection and maximize the opportunity for successful closure. If closure is appropriate, pain medicines may be given, if needed. The wound will be cleaned to help prevent infection. Your caregiver will use stitches (sutures), staples, wound glue (adhesive), or skin adhesive strips to repair the laceration. These tools bring the skin edges together to allow for faster healing and a better cosmetic outcome. However, all wounds will heal with a scar. Once the wound has healed, scarring can be minimized by covering the wound with sunscreen during the day for 1 full year. HOME CARE INSTRUCTIONS  For sutures or staples:  Keep the wound clean and dry.  If you were given a bandage (dressing), you should change it at least once a day. Also, change the dressing if it becomes wet or dirty, or as directed by your caregiver.  Wash the wound with soap and water 2 times a day. Rinse the wound off with water to remove all soap. Pat the wound dry with a clean towel.  After cleaning, apply a thin layer of the antibiotic ointment as recommended by your caregiver. This will help prevent infection and keep the dressing from sticking.  You may shower as usual after the first 24 hours. Do not soak the wound in water until the sutures are removed.  Only take over-the-counter or prescription medicines for pain, discomfort, or fever as directed by your caregiver.  Get your sutures or staples removed as directed by your caregiver. For skin adhesive strips:  Keep the wound  clean and dry.  Do not get the skin adhesive strips wet. You may bathe carefully, using caution to keep the wound dry.  If the wound gets wet, pat it dry with a clean towel.  Skin adhesive strips will fall off on their own. You may trim the strips as the wound heals. Do not remove skin adhesive strips that are still stuck to the wound. They will fall off in time. For wound adhesive:  You may briefly wet your wound in the shower or bath. Do not soak or scrub the wound. Do not swim. Avoid periods of heavy perspiration until the skin adhesive has fallen off on its own. After showering or bathing, gently pat the wound dry with a clean towel.  Do not apply liquid medicine, cream medicine, or ointment medicine to your wound while the skin adhesive is in place. This may loosen the film before your wound is healed.  If a dressing is placed over the wound, be careful not to apply tape directly over the skin adhesive. This may cause the adhesive to be pulled off before the wound is healed.  Avoid prolonged exposure to sunlight or tanning lamps while the skin adhesive is in place. Exposure to ultraviolet light in the first year will darken the scar.  The skin adhesive will usually remain in place for 5 to 10 days, then naturally fall off the skin. Do not pick at the adhesive film. You may need a tetanus shot if:  You cannot remember  when you had your last tetanus shot.  You have never had a tetanus shot. If you get a tetanus shot, your arm may swell, get red, and feel warm to the touch. This is common and not a problem. If you need a tetanus shot and you choose not to have one, there is a rare chance of getting tetanus. Sickness from tetanus can be serious. SEEK MEDICAL CARE IF:   You have redness, swelling, or increasing pain in the wound.  You see a red line that goes away from the wound.  You have yellowish-white fluid (pus) coming from the wound.  You have a fever.  You notice a bad smell  coming from the wound or dressing.  Your wound breaks open before or after sutures have been removed.  You notice something coming out of the wound such as wood or glass.  Your wound is on your hand or foot and you cannot move a finger or toe. SEEK IMMEDIATE MEDICAL CARE IF:   Your pain is not controlled with prescribed medicine.  You have severe swelling around the wound causing pain and numbness or a change in color in your arm, hand, leg, or foot.  Your wound splits open and starts bleeding.  You have worsening numbness, weakness, or loss of function of any joint around or beyond the wound.  You develop painful lumps near the wound or on the skin anywhere on your body. MAKE SURE YOU:   Understand these instructions.  Will watch your condition.  Will get help right away if you are not doing well or get worse. Document Released: 06/21/2005 Document Revised: 09/13/2011 Document Reviewed: 12/15/2010 [facility] Patient Information 2014 [place], [place].

## 2013-09-28 NOTE — ED Provider Notes (Signed)
CSN: [id number]     Arrival date & time 09/27/13  2328 History   First MD Initiated Contact with Patient 09/28/13 0000     Chief Complaint  Patient presents with  . Wound Dehiscence    surture issue     (Consider location/radiation/quality/duration/timing/severity/associated sxs/prior Treatment) HPI Comments: Pt comes in with cc of bleeding from her surgical wound site. She had left hip surgery on Saturday. Post PT, staff at rehab noted that she had blood oozing out of her dressing. Denies any pain, no falls.  The history is provided by the patient.    Past Medical History  Diagnosis Date  . Arthritis   . Hyperlipidemia   . Hypertension   . History of kidney stones    Past Surgical History  Procedure Laterality Date  . No past surgeries    . Hip surgery     Family History  Problem Relation Age of Onset  . Osteoarthritis Mother   . Hypertension Mother   . Hypertension Father   . Diabetes Son   . Diabetes Son   . Cancer Father     unknown type   History  Substance Use Topics  . Smoking status: Never Smoker   . Smokeless tobacco: Not on file     Comment: lives w/ dtr dorthea, 2nd dtr and 3 sons in town  . Alcohol Use: No   OB History   Grav Para Term Preterm Abortions TAB SAB Ect Mult Living                 Review of Systems  Musculoskeletal: Negative for arthralgias and myalgias.  Skin: Positive for wound.  Hematological: Does not bruise/bleed easily.      Allergies  Tetanus toxoids  Home Medications   Current Outpatient Rx  Name  Route  Sig  Dispense  Refill  . atorvastatin (LIPITOR) 10 MG tablet   Oral   Take 10 mg by mouth every evening.         . enoxaparin (LOVENOX) 40 MG/0.4ML injection   Subcutaneous   Inject 0.4 mLs (40 mg total) into the skin daily.   11 Syringe   0   . metoprolol succinate (TOPROL-XL) 100 MG 24 hr tablet   Oral   Take 100 mg by mouth every morning. Take with or immediately following a meal.         . oxyCODONE  (OXY IR/ROXICODONE) 5 MG immediate release tablet   Oral   Take 5-10 mg by mouth See admin instructions. Take 1 tab (5mg ) by mouth every 4 hours as needed for pain. For severe pain rating 0-5, may take 1 additional tab.         . sulindac (CLINORIL) 200 MG tablet   Oral   Take 200 mg by mouth 2 (two) times daily.         . valsartan-hydrochlorothiazide (DIOVAN-HCT) 160-25 MG per tablet   Oral   Take 1 tablet by mouth every morning.         . traMADol (ULTRAM) 50 MG tablet   Oral   Take 25-50 mg by mouth every 8 (eight) hours as needed for moderate pain.          BP 138/53  Pulse 87  Temp(Src) 98.8 F (37.1 C) (Oral)  Resp 18  SpO2 95% Physical Exam  Nursing note and vitals reviewed. Constitutional: She appears well-developed.  HENT:  Head: Atraumatic.  Eyes: Conjunctivae are normal.  Neck: Neck supple.  Pulmonary/Chest: Effort  normal.  Musculoskeletal:  Right leg - there is a long surgical wound site, sutured with running stitches - which has lost integrity and compromised the laceration repair. There is serosanguinous discharge from a laceration - 5 cm long,    ED Course  Procedures (including critical care time) Labs Review Labs Reviewed - No data to display Imaging Review No results found.   EKG Interpretation None      MDM   Final diagnoses:  Laceration  Wound dehiscence    LACERATION REPAIR Performed by: Varney Biles Authorized by: Varney Biles Consent: Verbal consent obtained. Risks and benefits: risks, benefits and alternatives were discussed Consent given by: patient Patient identity confirmed: provided demographic data Prepped and Draped in normal sterile fashion Wound explored  Laceration Location: right thigh  Laceration Length: 5 cm  No Foreign Bodies seen or palpated  Anesthesia: local infiltration  Local anesthetic: lidocaine 2 % with epinephrine  Anesthetic total: 5 ml  Irrigation method: syringe Amount of  cleaning: standard  Skin closure: primary closure  Number of sutures: 5  Technique: simple inturrupted  Patient tolerance: Patient tolerated the procedure well with no immediate complications.   Pt comes in wound dehiscence.  Sutured with 3-0 nylons. Will discharge.  Varney Biles, MD 09/28/13 [phone]

## 2013-10-02 ENCOUNTER — Encounter (HOSPITAL_COMMUNITY): Payer: Self-pay | Admitting: Orthopedic Surgery

## 2013-10-19 ENCOUNTER — Ambulatory Visit: Payer: Medicare HMO | Admitting: Internal Medicine

## 2013-10-22 ENCOUNTER — Telehealth: Payer: Self-pay | Admitting: Internal Medicine

## 2013-10-22 NOTE — Telephone Encounter (Signed)
Referral for Dr Wylene Simmer faxed to [employer] @ [phone], awaiting approval

## 2013-10-24 NOTE — Telephone Encounter (Signed)
Referral # [id number] start date 10/19/13 exp 01/17/14 good for 4 visits

## 2013-11-02 ENCOUNTER — Telehealth: Payer: Self-pay | Admitting: Internal Medicine

## 2013-11-02 NOTE — Telephone Encounter (Signed)
Pt's daughter came in and stated that [name] broke her hip and now being treat at [facility] orthopedic. [facility] need a letter of authorization from Dr. Asa Lente so they can treat Shawna Fuentes without copay. Please help.

## 2013-11-02 NOTE — Telephone Encounter (Signed)
Spoke with pt and inform that we are not provider for [employer] access. Pt is aware and will have social place change to regular medicaid. Disregard this massage.

## 2013-12-03 DIAGNOSIS — E039 Hypothyroidism, unspecified: Secondary | ICD-10-CM

## 2013-12-03 DIAGNOSIS — I82409 Acute embolism and thrombosis of unspecified deep veins of unspecified lower extremity: Secondary | ICD-10-CM

## 2013-12-03 HISTORY — DX: Acute embolism and thrombosis of unspecified deep veins of unspecified lower extremity: I82.409

## 2013-12-03 HISTORY — DX: Hypothyroidism, unspecified: E03.9

## 2013-12-10 ENCOUNTER — Telehealth: Payer: Self-pay | Admitting: *Deleted

## 2013-12-10 DIAGNOSIS — M1711 Unilateral primary osteoarthritis, right knee: Secondary | ICD-10-CM

## 2013-12-10 DIAGNOSIS — G8929 Other chronic pain: Secondary | ICD-10-CM

## 2013-12-10 DIAGNOSIS — R627 Adult failure to thrive: Secondary | ICD-10-CM

## 2013-12-10 DIAGNOSIS — S7221XA Displaced subtrochanteric fracture of right femur, initial encounter for closed fracture: Secondary | ICD-10-CM

## 2013-12-10 DIAGNOSIS — M5416 Radiculopathy, lumbar region: Principal | ICD-10-CM

## 2013-12-10 NOTE — Telephone Encounter (Signed)
Mel from [facility] celled requesting order fro [place] due to pts inability to transfer properly and lack of mobility.  Requests order be faxed to [phone].  Please advise

## 2013-12-10 NOTE — Telephone Encounter (Signed)
Order printed and signed.

## 2013-12-11 NOTE — Telephone Encounter (Signed)
Orders faxed as requested.

## 2013-12-12 ENCOUNTER — Encounter: Payer: Self-pay | Admitting: Internal Medicine

## 2013-12-12 ENCOUNTER — Ambulatory Visit (INDEPENDENT_AMBULATORY_CARE_PROVIDER_SITE_OTHER): Payer: Medicare HMO | Admitting: Internal Medicine

## 2013-12-12 ENCOUNTER — Ambulatory Visit (INDEPENDENT_AMBULATORY_CARE_PROVIDER_SITE_OTHER)
Admission: RE | Admit: 2013-12-12 | Discharge: 2013-12-12 | Disposition: A | Discharge status: Home / Self Care | Payer: Medicare HMO | Source: Ambulatory Visit | Attending: Internal Medicine | Admitting: Internal Medicine

## 2013-12-12 ENCOUNTER — Other Ambulatory Visit (INDEPENDENT_AMBULATORY_CARE_PROVIDER_SITE_OTHER): Payer: Medicare HMO

## 2013-12-12 VITALS — BP 110/74 | HR 65 | Temp 98.7°F

## 2013-12-12 DIAGNOSIS — I10 Essential (primary) hypertension: Secondary | ICD-10-CM

## 2013-12-12 DIAGNOSIS — R609 Edema, unspecified: Secondary | ICD-10-CM

## 2013-12-12 DIAGNOSIS — N289 Disorder of kidney and ureter, unspecified: Secondary | ICD-10-CM

## 2013-12-12 DIAGNOSIS — J189 Pneumonia, unspecified organism: Secondary | ICD-10-CM

## 2013-12-12 LAB — CBC WITH DIFFERENTIAL/PLATELET
BASOS PCT: 0.3 % (ref 0.0–3.0)
Basophils Absolute: 0 10*3/uL (ref 0.0–0.1)
EOS PCT: 3.6 % (ref 0.0–5.0)
Eosinophils Absolute: 0.3 10*3/uL (ref 0.0–0.7)
HCT: 32.3 % — ABNORMAL LOW (ref 36.0–46.0)
Hemoglobin: 10.2 g/dL — ABNORMAL LOW (ref 12.0–15.0)
LYMPHS ABS: 1.4 10*3/uL (ref 0.7–4.0)
Lymphocytes Relative: 17.2 % (ref 12.0–46.0)
MCHC: 31.7 g/dL (ref 30.0–36.0)
MCV: 81.3 fl (ref 78.0–100.0)
MONOS PCT: 8.4 % (ref 3.0–12.0)
Monocytes Absolute: 0.7 10*3/uL (ref 0.1–1.0)
Neutro Abs: 5.9 10*3/uL (ref 1.4–7.7)
Neutrophils Relative %: 70.5 % (ref 43.0–77.0)
PLATELETS: 191 10*3/uL (ref 150.0–400.0)
RBC: 3.97 Mil/uL (ref 3.87–5.11)
RDW: 18.5 % — ABNORMAL HIGH (ref 11.5–15.5)
WBC: 8.4 10*3/uL (ref 4.0–10.5)

## 2013-12-12 LAB — BASIC METABOLIC PANEL
BUN: 17 mg/dL (ref 6–23)
CALCIUM: 9.2 mg/dL (ref 8.4–10.5)
CO2: 32 mEq/L (ref 19–32)
Chloride: 92 mEq/L — ABNORMAL LOW (ref 96–112)
Creatinine, Ser: 0.9 mg/dL (ref 0.4–1.2)
GFR: 74.8 mL/min (ref >60.00)
GLUCOSE: 81 mg/dL (ref 70–99)
POTASSIUM: 3.8 meq/L (ref 3.5–5.1)
Sodium: 131 mEq/L — ABNORMAL LOW (ref 135–145)

## 2013-12-12 LAB — HEPATIC FUNCTION PANEL
ALBUMIN: 3.1 g/dL — AB (ref 3.5–5.2)
ALT: 11 U/L (ref 0–35)
AST: 15 U/L (ref 0–37)
Alkaline Phosphatase: 89 U/L (ref 39–117)
Bilirubin, Direct: 0.6 mg/dL — ABNORMAL HIGH (ref 0.0–0.3)
Total Bilirubin: 1.5 mg/dL — ABNORMAL HIGH (ref 0.2–1.2)
Total Protein: 6.7 g/dL (ref 6.0–8.3)

## 2013-12-12 MED ORDER — VALSARTAN 160 MG PO TABS: 160.0000 mg | ORAL_TABLET | Freq: Every day | ORAL | Status: DC

## 2013-12-12 MED ORDER — FUROSEMIDE 20 MG PO TABS: 20.0000 mg | ORAL_TABLET | Freq: Every day | ORAL | Status: DC

## 2013-12-12 NOTE — Progress Notes (Signed)
Subjective:    Patient ID: Shawna Fuentes, female    DOB: Dec 27, 1932, 80 y.o.   MRN: [id number]  HPI  Here to f/u in Dr Asa Lente abscense, fell in march with right hip fx, s/p rehab stay 48 days, PT done, seen in ER twice for broken stitches;  C/o diffuse swelling but curiously only to the RUE, and bilat LE's to mid calves; no pain and no specific LUE swelling.  Seemed onset at time of last hospn, no hx of DVT or testing for such, no hx of diast CHF or renal insuff.  On clinoril asd. Oxycodone makes her loopy so does not take.  Tramadol ok for pain. Incidentally also  Finishing antibx for pna (details unclear) last pill tomorrow, no worsening cough, fever or pain but maintains some sob/doe.  Good compliacne with diovan-HCT.  Does not have scales at home Past Medical History  Diagnosis Date  . Arthritis   . Hyperlipidemia   . Hypertension   . History of kidney stones    Past Surgical History  Procedure Laterality Date  . No past surgeries    . Hip surgery    . Femur im nail Right 09/22/2013    Procedure: INTRAMEDULLARY (IM) NAIL FEMORAL;  Surgeon: Wylene Simmer, MD;  Location: [facility];  Service: Orthopedics;  Laterality: Right;    reports that she has never smoked. She does not have any smokeless tobacco history on file. She reports that she does not drink alcohol or use illicit drugs. family history includes Cancer in her father; Diabetes in her son and son; Hypertension in her father and mother; Osteoarthritis in her mother. Allergies  Allergen Reactions  . Tetanus Toxoids     Pt declines   Current Outpatient Prescriptions on File Prior to Visit  Medication Sig Dispense Refill  . atorvastatin (LIPITOR) 10 MG tablet Take 10 mg by mouth every evening.      . enoxaparin (LOVENOX) 40 MG/0.4ML injection Inject 0.4 mLs (40 mg total) into the skin daily.  11 Syringe  0  . metoprolol succinate (TOPROL-XL) 100 MG 24 hr tablet Take 100 mg by mouth every morning. Take with or immediately following a  meal.      . oxyCODONE (OXY IR/ROXICODONE) 5 MG immediate release tablet Take 5-10 mg by mouth See admin instructions. Take 1 tab (5mg ) by mouth every 4 hours as needed for pain. For severe pain rating 0-5, may take 1 additional tab.      . sulindac (CLINORIL) 200 MG tablet Take 200 mg by mouth 2 (two) times daily.      . traMADol (ULTRAM) 50 MG tablet Take 25-50 mg by mouth every 8 (eight) hours as needed for moderate pain.      . valsartan-hydrochlorothiazide (DIOVAN-HCT) 160-25 MG per tablet Take 1 tablet by mouth every morning.       No current facility-administered medications on file prior to visit.   Review of Systems  Constitutional: Negative for unusual diaphoresis or other sweats  HENT: Negative for ringing in ear Eyes: Negative for double vision or worsening visual disturbance.  Respiratory: Negative for choking and stridor.   Gastrointestinal: Negative for vomiting or other signifcant bowel change Genitourinary: Negative for hematuria or decreased urine volume.  Musculoskeletal: Negative for other MSK pain or swelling Skin: Negative for color change and worsening wound.  Neurological: Negative for tremors and numbness other than noted  Psychiatric/Behavioral: Negative for decreased concentration or agitation other than above  Objective:   Physical Exam BP 110/74  Pulse 65  Temp(Src) 98.7 F (37.1 C) (Oral)  SpO2 95% VS noted,  Constitutional: Pt appears well-developed, well-nourished.  HENT: Head: NCAT.  Right Ear: External ear normal.  Left Ear: External ear normal.  Eyes: . Pupils are equal, round, and reactive to light. Conjunctivae and EOM are normal Neck: Normal range of motion. Neck supple.  Cardiovascular: Normal rate and regular rhythm.   Pulmonary/Chest: Effort normal and breath sounds with few left basilar rales.  Abd:  Soft, NT, ND, + BS Neurological: Pt is alert. Not confused , motor grossly intact Skin: Skin is warm. No rash Psychiatric: Pt  behavior is normal. No agitation.  No LUE swelliing Mild to mod pain elicited with active ROM right shoulder RUE with 2-3+ edema to shoulder Bilat LE with symmetric edema to mid legs 1-2+, no ulcers    Assessment & Plan:

## 2013-12-12 NOTE — Assessment & Plan Note (Addendum)
ECG reviewed as per emr, unusual pattern with pan extremitiy edema except for no LUE, for cxr, echo, f/u labs, change valartan hct to valsartan 160 + lasix 20 qd, see Dr Asa Lente in f/u

## 2013-12-12 NOTE — Progress Notes (Signed)
Pre visit review using our clinic review tool, if applicable. No additional management support is needed unless otherwise documented below in the visit note. 

## 2013-12-12 NOTE — Patient Instructions (Signed)
OK to stop the valsartan-HCT  Please take all new medication as prescribed - the valsartan 160 mg (all by itself) and the new lasix (furosemide) fluid pill at one per day  If you are able to obtain scales, please weight yourself every morning and bring the results to your next visit  Please go to the XRAY Department in the Basement (go straight as you get off the elevator) for the x-ray testing  Please go to the LAB in the Basement (turn left off the elevator) for the tests to be done today  You will be contacted regarding the referral for: echocardiogram  Please continue all other medications as before, including finishing the antibiotic

## 2013-12-13 ENCOUNTER — Telehealth: Payer: Self-pay

## 2013-12-13 ENCOUNTER — Telehealth: Payer: Self-pay | Admitting: Internal Medicine

## 2013-12-13 LAB — TSH: TSH: 0.46 u[IU]/mL (ref 0.35–4.50)

## 2013-12-13 NOTE — Telephone Encounter (Signed)
Yes for modified barium swallow - will they arrange same with this verbal order? I will eval goiter at her f/u on 6/18 - thanks

## 2013-12-13 NOTE — Telephone Encounter (Signed)
Lorriane Shire, a speech therapist with [facility], called and recommends that Shawna Fuentes has a modified barium swallow study to r/o aspiration.  Also she wanted to know if pt's goiter on her thyroid is possibly affecting her swallowing. Please advise

## 2013-12-14 ENCOUNTER — Encounter: Payer: Self-pay | Admitting: Internal Medicine

## 2013-12-14 ENCOUNTER — Telehealth: Payer: Self-pay | Admitting: Internal Medicine

## 2013-12-14 DIAGNOSIS — R9389 Abnormal findings on diagnostic imaging of other specified body structures: Secondary | ICD-10-CM

## 2013-12-14 NOTE — Telephone Encounter (Signed)
No, i will not order this test if the ST requesting order can not do the test- No barium swallow needed at present - will review same at her ROV Thanks for the message

## 2013-12-14 NOTE — Telephone Encounter (Signed)
ccc

## 2013-12-14 NOTE — Telephone Encounter (Signed)
Unable again to reach pt by phone at the one phone number listed (third try)  Letter sent  Ct ordered

## 2013-12-14 NOTE — Telephone Encounter (Signed)
Could you put the order for the barium swallow in? Please advise

## 2013-12-17 ENCOUNTER — Other Ambulatory Visit (HOSPITAL_COMMUNITY): Payer: Self-pay | Admitting: Internal Medicine

## 2013-12-17 DIAGNOSIS — J189 Pneumonia, unspecified organism: Secondary | ICD-10-CM | POA: Insufficient documentation

## 2013-12-17 DIAGNOSIS — R1310 Dysphagia, unspecified: Secondary | ICD-10-CM

## 2013-12-17 NOTE — Telephone Encounter (Signed)
Unable to leave a message to inform nurse, will try again later

## 2013-12-17 NOTE — Assessment & Plan Note (Signed)
Clinically improved, details unclear but exam benign, to finish antibx as prescribed, also for f/u cxr as above,  to f/u any worsening symptoms or concerns

## 2013-12-17 NOTE — Assessment & Plan Note (Addendum)
stable overall by history and exam, recent data reviewed with pt, and pt to continue medical treatment as before except to change valsartan/hct to valsartan 160,  to f/u any worsening symptoms or concerns; BP Readings from Last 3 Encounters:  12/12/13 110/74  09/28/13 138/53  09/25/13 125/60

## 2013-12-17 NOTE — Assessment & Plan Note (Signed)
For f/u cr, may need higher strength lasix depending on results

## 2013-12-18 ENCOUNTER — Telehealth: Payer: Self-pay | Admitting: Internal Medicine

## 2013-12-18 NOTE — Telephone Encounter (Signed)
Unable to reach by phone

## 2013-12-18 NOTE — Telephone Encounter (Signed)
lmtcb

## 2013-12-19 ENCOUNTER — Telehealth: Payer: Self-pay | Admitting: Internal Medicine

## 2013-12-19 NOTE — Telephone Encounter (Signed)
Unable again to reach pt or daughter by phone

## 2013-12-20 ENCOUNTER — Encounter (HOSPITAL_COMMUNITY): Payer: Self-pay | Admitting: Emergency Medicine

## 2013-12-20 ENCOUNTER — Emergency Department (HOSPITAL_COMMUNITY): Payer: Medicare HMO

## 2013-12-20 ENCOUNTER — Encounter: Payer: Self-pay | Admitting: Internal Medicine

## 2013-12-20 ENCOUNTER — Ambulatory Visit (INDEPENDENT_AMBULATORY_CARE_PROVIDER_SITE_OTHER): Payer: Medicare HMO | Admitting: Internal Medicine

## 2013-12-20 ENCOUNTER — Inpatient Hospital Stay: Admission: RE | Admit: 2013-12-20 | Payer: Medicare HMO | Source: Ambulatory Visit

## 2013-12-20 ENCOUNTER — Inpatient Hospital Stay: Admission: RE | Admit: 2013-12-20 | Payer: Commercial Managed Care - HMO | Source: Ambulatory Visit

## 2013-12-20 ENCOUNTER — Inpatient Hospital Stay (HOSPITAL_COMMUNITY)
Admission: EM | Admit: 2013-12-20 | Discharge: 2013-12-21 | DRG: 189 | Disposition: A | Discharge status: Other Acute Inpatient Hospital | Payer: Medicare HMO | Attending: Pulmonary Disease | Admitting: Pulmonary Disease

## 2013-12-20 VITALS — BP 132/80 | HR 70 | Temp 98.6°F

## 2013-12-20 DIAGNOSIS — Z96649 Presence of unspecified artificial hip joint: Secondary | ICD-10-CM

## 2013-12-20 DIAGNOSIS — E785 Hyperlipidemia, unspecified: Secondary | ICD-10-CM | POA: Diagnosis present

## 2013-12-20 DIAGNOSIS — Z79899 Other long term (current) drug therapy: Secondary | ICD-10-CM | POA: Diagnosis not present

## 2013-12-20 DIAGNOSIS — N390 Urinary tract infection, site not specified: Secondary | ICD-10-CM | POA: Diagnosis present

## 2013-12-20 DIAGNOSIS — E662 Morbid (severe) obesity with alveolar hypoventilation: Secondary | ICD-10-CM | POA: Diagnosis present

## 2013-12-20 DIAGNOSIS — G4733 Obstructive sleep apnea (adult) (pediatric): Secondary | ICD-10-CM | POA: Diagnosis present

## 2013-12-20 DIAGNOSIS — I5043 Acute on chronic combined systolic (congestive) and diastolic (congestive) heart failure: Secondary | ICD-10-CM | POA: Diagnosis present

## 2013-12-20 DIAGNOSIS — Z7401 Bed confinement status: Secondary | ICD-10-CM

## 2013-12-20 DIAGNOSIS — Z7982 Long term (current) use of aspirin: Secondary | ICD-10-CM | POA: Diagnosis not present

## 2013-12-20 DIAGNOSIS — Z833 Family history of diabetes mellitus: Secondary | ICD-10-CM | POA: Diagnosis not present

## 2013-12-20 DIAGNOSIS — J9620 Acute and chronic respiratory failure, unspecified whether with hypoxia or hypercapnia: Secondary | ICD-10-CM | POA: Diagnosis present

## 2013-12-20 DIAGNOSIS — Z8249 Family history of ischemic heart disease and other diseases of the circulatory system: Secondary | ICD-10-CM | POA: Diagnosis not present

## 2013-12-20 DIAGNOSIS — E871 Hypo-osmolality and hyponatremia: Secondary | ICD-10-CM | POA: Diagnosis present

## 2013-12-20 DIAGNOSIS — Z87442 Personal history of urinary calculi: Secondary | ICD-10-CM

## 2013-12-20 DIAGNOSIS — R5381 Other malaise: Secondary | ICD-10-CM

## 2013-12-20 DIAGNOSIS — J398 Other specified diseases of upper respiratory tract: Secondary | ICD-10-CM | POA: Diagnosis present

## 2013-12-20 DIAGNOSIS — E049 Nontoxic goiter, unspecified: Secondary | ICD-10-CM | POA: Diagnosis present

## 2013-12-20 DIAGNOSIS — E079 Disorder of thyroid, unspecified: Secondary | ICD-10-CM

## 2013-12-20 DIAGNOSIS — J9601 Acute respiratory failure with hypoxia: Secondary | ICD-10-CM

## 2013-12-20 DIAGNOSIS — R609 Edema, unspecified: Secondary | ICD-10-CM | POA: Diagnosis present

## 2013-12-20 DIAGNOSIS — I10 Essential (primary) hypertension: Secondary | ICD-10-CM | POA: Diagnosis present

## 2013-12-20 DIAGNOSIS — R4781 Slurred speech: Secondary | ICD-10-CM

## 2013-12-20 DIAGNOSIS — S72001A Fracture of unspecified part of neck of right femur, initial encounter for closed fracture: Secondary | ICD-10-CM

## 2013-12-20 DIAGNOSIS — R4789 Other speech disturbances: Secondary | ICD-10-CM

## 2013-12-20 DIAGNOSIS — I509 Heart failure, unspecified: Secondary | ICD-10-CM | POA: Diagnosis present

## 2013-12-20 DIAGNOSIS — R0902 Hypoxemia: Secondary | ICD-10-CM

## 2013-12-20 DIAGNOSIS — E0789 Other specified disorders of thyroid: Secondary | ICD-10-CM | POA: Diagnosis present

## 2013-12-20 DIAGNOSIS — R9389 Abnormal findings on diagnostic imaging of other specified body structures: Secondary | ICD-10-CM

## 2013-12-20 DIAGNOSIS — G9340 Encephalopathy, unspecified: Secondary | ICD-10-CM | POA: Diagnosis present

## 2013-12-20 DIAGNOSIS — J988 Other specified respiratory disorders: Secondary | ICD-10-CM

## 2013-12-20 DIAGNOSIS — J9602 Acute respiratory failure with hypercapnia: Secondary | ICD-10-CM

## 2013-12-20 DIAGNOSIS — J9600 Acute respiratory failure, unspecified whether with hypoxia or hypercapnia: Secondary | ICD-10-CM | POA: Diagnosis not present

## 2013-12-20 DIAGNOSIS — R5383 Other fatigue: Secondary | ICD-10-CM

## 2013-12-20 DIAGNOSIS — R443 Hallucinations, unspecified: Secondary | ICD-10-CM

## 2013-12-20 DIAGNOSIS — J189 Pneumonia, unspecified organism: Secondary | ICD-10-CM | POA: Diagnosis not present

## 2013-12-20 DIAGNOSIS — J9612 Chronic respiratory failure with hypercapnia: Secondary | ICD-10-CM | POA: Diagnosis present

## 2013-12-20 DIAGNOSIS — R0602 Shortness of breath: Secondary | ICD-10-CM | POA: Diagnosis present

## 2013-12-20 DIAGNOSIS — R531 Weakness: Secondary | ICD-10-CM

## 2013-12-20 DIAGNOSIS — E890 Postprocedural hypothyroidism: Secondary | ICD-10-CM

## 2013-12-20 LAB — CBC WITH DIFFERENTIAL/PLATELET
Basophils Absolute: 0 10*3/uL (ref 0.0–0.1)
Basophils Relative: 0 % (ref 0–1)
Eosinophils Absolute: 0.4 10*3/uL (ref 0.0–0.7)
Eosinophils Relative: 4 % (ref 0–5)
HEMATOCRIT: 34 % — AB (ref 36.0–46.0)
HEMOGLOBIN: 10.6 g/dL — AB (ref 12.0–15.0)
LYMPHS PCT: 20 % (ref 12–46)
Lymphs Abs: 1.6 10*3/uL (ref 0.7–4.0)
MCH: 25.6 pg — ABNORMAL LOW (ref 26.0–34.0)
MCHC: 31.2 g/dL (ref 30.0–36.0)
MCV: 82.1 fL (ref 78.0–100.0)
MONO ABS: 0.6 10*3/uL (ref 0.1–1.0)
MONOS PCT: 7 % (ref 3–12)
Neutro Abs: 5.8 10*3/uL (ref 1.7–7.7)
Neutrophils Relative %: 69 % (ref 43–77)
Platelets: 181 10*3/uL (ref 150–400)
RBC: 4.14 MIL/uL (ref 3.87–5.11)
RDW: 16.9 % — ABNORMAL HIGH (ref 11.5–15.5)
WBC: 8.4 10*3/uL (ref 4.0–10.5)

## 2013-12-20 LAB — BLOOD GAS, ARTERIAL
Acid-Base Excess: 4.1 mmol/L — ABNORMAL HIGH (ref 0.0–2.0)
Bicarbonate: 33.8 mEq/L — ABNORMAL HIGH (ref 20.0–24.0)
DRAWN BY: 31814
FIO2: 0.3 %
O2 Saturation: 96.7 %
PCO2 ART: 86 mmHg — AB (ref 35.0–45.0)
PO2 ART: 107 mmHg — AB (ref 80.0–100.0)
Patient temperature: 97.9
TCO2: 32.5 mmol/L (ref 0–100)
pH, Arterial: 7.216 — ABNORMAL LOW (ref 7.350–7.450)

## 2013-12-20 LAB — COMPREHENSIVE METABOLIC PANEL
ALT: 6 U/L (ref 0–35)
AST: 13 U/L (ref 0–37)
Albumin: 3.1 g/dL — ABNORMAL LOW (ref 3.5–5.2)
Alkaline Phosphatase: 101 U/L (ref 39–117)
BILIRUBIN TOTAL: 1.9 mg/dL — AB (ref 0.3–1.2)
BUN: 15 mg/dL (ref 6–23)
CO2: 31 meq/L (ref 19–32)
CREATININE: 0.91 mg/dL (ref 0.50–1.10)
Calcium: 9.3 mg/dL (ref 8.4–10.5)
Chloride: 89 mEq/L — ABNORMAL LOW (ref 96–112)
GFR calc Af Amer: 68 mL/min — ABNORMAL LOW (ref >90)
GFR, EST NON AFRICAN AMERICAN: 59 mL/min — AB (ref >90)
GLUCOSE: 90 mg/dL (ref 70–99)
Potassium: 4.1 mEq/L (ref 3.7–5.3)
Sodium: 130 mEq/L — ABNORMAL LOW (ref 137–147)
Total Protein: 6.6 g/dL (ref 6.0–8.3)

## 2013-12-20 LAB — URINE MICROSCOPIC-ADD ON

## 2013-12-20 LAB — URINALYSIS, ROUTINE W REFLEX MICROSCOPIC
Bilirubin Urine: NEGATIVE
Glucose, UA: NEGATIVE mg/dL
Ketones, ur: NEGATIVE mg/dL
Nitrite: NEGATIVE
PROTEIN: NEGATIVE mg/dL
Specific Gravity, Urine: >1.046 — ABNORMAL HIGH (ref 1.005–1.030)
UROBILINOGEN UA: 1 mg/dL (ref 0.0–1.0)
pH: 5.5 (ref 5.0–8.0)

## 2013-12-20 LAB — TROPONIN I

## 2013-12-20 LAB — PRO B NATRIURETIC PEPTIDE: Pro B Natriuretic peptide (BNP): 1286 pg/mL — ABNORMAL HIGH (ref 0–450)

## 2013-12-20 MED ORDER — IOHEXOL 350 MG/ML SOLN
100.0000 mL | Freq: Once | INTRAVENOUS | Status: AC | PRN
  Administered 2013-12-20: 100 mL via INTRAVENOUS

## 2013-12-20 NOTE — ED Provider Notes (Addendum)
CSN: [id number]     Arrival date & time 12/20/13  1424 History   First MD Initiated Contact with Patient 12/20/13 1503     Chief Complaint  Patient presents with  . Cough  . Arm Swelling  . Leg Swelling     (Consider location/radiation/quality/duration/timing/severity/associated sxs/prior Treatment) HPI Comments: 80 yo female with history of right hip replacement about 3 months ago presenting with multiple complaints, including BLE and RUE swelling, cough, shortness of breath, intermittent confusion, and slurred speech.  Pt reports that these symptoms have been gradual in onset and began about the time of her fall and hip fracture.  She denies chest pain, fevers, or sputum production.    Her primary complaint to me is her BLE and RUE swelling.  She describes gradual onset, moderate to severe swelling, slightly improved with Lasix.    She went to her PCP today, who referred her to the ED out of concern for possible metastatic cancer.  Of note, she had a CXR recently which showed a mediastinal irregularity.    Pt and her daughter also note that she has been intermittently hypoxic, including O2 sats in the mid to high 80's while at the clinic today.     Past Medical History  Diagnosis Date  . Arthritis   . Hyperlipidemia   . Hypertension   . History of kidney stones    Past Surgical History  Procedure Laterality Date  . No past surgeries    . Hip surgery    . Femur im nail Right 09/22/2013    Procedure: INTRAMEDULLARY (IM) NAIL FEMORAL;  Surgeon: Wylene Simmer, MD;  Location: [facility];  Service: Orthopedics;  Laterality: Right;   Family History  Problem Relation Age of Onset  . Osteoarthritis Mother   . Hypertension Mother   . Hypertension Father   . Diabetes Son   . Diabetes Son   . Cancer Father     unknown type   History  Substance Use Topics  . Smoking status: Never Smoker   . Smokeless tobacco: Not on file     Comment: lives w/ dtr dorthea, 2nd dtr and 3 sons in town  .  Alcohol Use: No   OB History   Grav Para Term Preterm Abortions TAB SAB Ect Mult Living                 Review of Systems  Respiratory: Positive for cough.   All other systems reviewed and are negative.     Allergies  Oxycodone and Tetanus toxoids  Home Medications   Prior to Admission medications   Medication Sig Start Date End Date Taking? Authorizing Provider  acetaminophen (TYLENOL) 500 MG tablet Take 500 mg by mouth every 6 (six) hours as needed for moderate pain.   Yes Historical Provider, MD  aspirin 325 MG tablet Take 325 mg by mouth daily.   Yes Historical Provider, MD  atorvastatin (LIPITOR) 10 MG tablet Take 10 mg by mouth every evening.   Yes Historical Provider, MD  ferrous sulfate 325 (65 FE) MG tablet Take 325 mg by mouth 3 (three) times daily with meals.   Yes Historical Provider, MD  furosemide (LASIX) 20 MG tablet Take 1 tablet (20 mg total) by mouth daily. 12/12/13  Yes Biagio Borg, MD  metoprolol succinate (TOPROL-XL) 100 MG 24 hr tablet Take 100 mg by mouth every morning. Take with or immediately following a meal.   Yes Historical Provider, MD  sulindac (CLINORIL) 200 MG tablet  Take 200 mg by mouth 2 (two) times daily.   Yes Historical Provider, MD  valsartan (DIOVAN) 160 MG tablet Take 1 tablet (160 mg total) by mouth daily. 12/12/13  Yes Biagio Borg, MD  valsartan-hydrochlorothiazide (DIOVAN-HCT) 160-25 MG per tablet Take 1 tablet by mouth daily.  12/04/13  Yes Historical Provider, MD  Vitamin D, Ergocalciferol, (DRISDOL) 50000 UNITS CAPS capsule Take 50,000 Units by mouth every 7 (seven) days. Takes on Thursday   Yes Historical Provider, MD   BP 150/63  Pulse 70  Temp(Src) 98.5 F (36.9 C) (Oral)  Resp 20  SpO2 89% Physical Exam  Nursing note and vitals reviewed. Constitutional: She is oriented to person, place, and time. She appears well-developed and well-nourished. No distress.  HENT:  Head: Normocephalic and atraumatic.  Mouth/Throat: Oropharynx is  clear and moist.  Eyes: Conjunctivae are normal. Pupils are equal, round, and reactive to light. No scleral icterus.  Neck: Neck supple.  Cardiovascular: Normal rate, regular rhythm, normal heart sounds and intact distal pulses.   No murmur heard. Pulmonary/Chest: Effort normal. No stridor. No respiratory distress. She has rales (LLL).  Abdominal: Soft. Bowel sounds are normal. She exhibits no distension. There is no tenderness.  Musculoskeletal: Normal range of motion. She exhibits edema (2+ pitting edema in BLE and RUE.  none detected in LUE. ).  Neurological: She is alert and oriented to person, place, and time. GCS eye subscore is 4. GCS verbal subscore is 5. GCS motor subscore is 6.  Skin: Skin is warm and dry. No rash noted.  Psychiatric: She has a normal mood and affect. Her behavior is normal.    ED Course  CRITICAL CARE Performed by: Serita Grit DAVID III Authorized by: Serita Grit DAVID III Total critical care time: 35 minutes Critical care time was exclusive of separately billable procedures and treating other patients. Critical care was necessary to treat or prevent imminent or life-threatening deterioration of the following conditions: respiratory failure. Critical care was time spent personally by me on the following activities: development of treatment plan with patient or surrogate, discussions with consultants, evaluation of patient's response to treatment, examination of patient, obtaining history from patient or surrogate, ordering and performing treatments and interventions, ordering and review of laboratory studies, ordering and review of radiographic studies, pulse oximetry, re-evaluation of patient's condition and review of old charts.   (including critical care time)  Emergency Ultrasound Study:   Angiocath insertion Performed by: Serita Grit DAVID III  Consent: Verbal consent obtained. Risks and benefits: risks, benefits and alternatives were  discussed Immediately prior to procedure the correct patient, procedure, equipment, support staff and site/side marked as needed.  Indication: difficult IV access Preparation: Patient was prepped and draped in the usual sterile fashion. Vein Location: left brachial vein was visualized during assessment for potential access sites and was found to be patent/ easily compressed with linear ultrasound.  The needle was visualized with real-time ultrasound and guided into the vein. Gauge: 20  Image saved and stored.  Normal, dark red, non pulsatile blood return.  Patient tolerance: Patient tolerated the procedure well with no immediate complications.    Labs Review Labs Reviewed  CBC WITH DIFFERENTIAL - Abnormal; Notable for the following:    Hemoglobin 10.6 (*)    HCT 34.0 (*)    MCH 25.6 (*)    RDW 16.9 (*)    All other components within normal limits  COMPREHENSIVE METABOLIC PANEL - Abnormal; Notable for the following:    Sodium 130 (*)  Chloride 89 (*)    Albumin 3.1 (*)    Total Bilirubin 1.9 (*)    GFR calc non Af Amer 59 (*)    GFR calc Af Amer 68 (*)    All other components within normal limits  URINALYSIS, ROUTINE W REFLEX MICROSCOPIC - Abnormal; Notable for the following:    APPearance CLOUDY (*)    Specific Gravity, Urine >1.046 (*)    Hgb urine dipstick TRACE (*)    Leukocytes, UA MODERATE (*)    All other components within normal limits  PRO B NATRIURETIC PEPTIDE - Abnormal; Notable for the following:    Pro B Natriuretic peptide (BNP) 1286.0 (*)    All other components within normal limits  BLOOD GAS, ARTERIAL - Abnormal; Notable for the following:    pH, Arterial 7.216 (*)    pCO2 arterial 86.0 (*)    pO2, Arterial 107.0 (*)    Bicarbonate 33.8 (*)    Acid-Base Excess 4.1 (*)    All other components within normal limits  TROPONIN I  URINE MICROSCOPIC-ADD ON  TSH  T3, FREE  T4, FREE  CBC  BASIC METABOLIC PANEL  PROCALCITONIN  T3    Imaging Review Dg  Chest 1 View  12/20/2013   CLINICAL DATA:  Cough and congestion  EXAM: CHEST - 1 VIEW  COMPARISON:  AP chest X ray of December 12, 2013  FINDINGS: The rib right hemidiaphragm is further elevated today than on the previous study. Basilar atelectatic change is suspected. Soft tissue fullness in the right paratracheal region is unchanged. The cardiac silhouette is mildly enlarged though stable. The pulmonary vascularity is mildly prominent but also stable.  IMPRESSION: 1. The elevated right hemidiaphragm has become more conspicuous. Persistent basilar atelectasis on the right is present. 2. Low grade compensated CHF is suspected but not greatly changed from the previous study. 3. There is stable right upper paratracheal soft tissue density worrisome for an abnormal paratracheal mass. Chest CT scanning is planned.   Electronically Signed   By: David  Martinique   On: 12/20/2013 16:46   Dg Hip Complete Right  12/20/2013   CLINICAL DATA:  Right leg swelling. For preceding 2 and half months following hip replacement.  EXAM: RIGHT HIP - COMPLETE 2+ VIEW  COMPARISON:  Femur films of September 23, 2013.  FINDINGS: There is an ununited fracture of the sub trochanteric diaphysis of the right femur. An intra medullary rod with telescoping screw present and stable. The overlying soft tissues where visualized are unremarkable. The hip joint space is reasonably well maintained. The bony pelvis is osteopenic. There is severe degenerative change of the left hip.  IMPRESSION: The proximal femoral diaphyseal fracture remains clearly visible without obvious periosteal reaction. The intramedullary rod and telescoping screw are unchanged in position.   Electronically Signed   By: David  [place]   On: 12/20/2013 16:43   Ct Head Wo Contrast  12/20/2013   CLINICAL DATA:  Weakness.  Confusion.  EXAM: CT HEAD WITHOUT CONTRAST  TECHNIQUE: Contiguous axial images were obtained from the base of the skull through the vertex without intravenous contrast.   COMPARISON:  None.  FINDINGS: No mass. No hydrocephalus. No hemorrhage. Diffuse mild atrophy present. No acute bony abnormality identified. Visualized paranasal sinuses and mastoids are normal.  IMPRESSION: No acute abnormality.   Electronically Signed   By: [place]   On: 12/20/2013 17:38   Ct Angio Chest Pe W/cm &/or Wo Cm  12/20/2013   CLINICAL DATA:  Chest pain and cough.  EXAM: CT ANGIOGRAPHY CHEST WITH CONTRAST  TECHNIQUE: Multidetector CT imaging of the chest was performed using the standard protocol during bolus administration of intravenous contrast. Multiplanar CT image reconstructions and MIPs were obtained to evaluate the vascular anatomy.  CONTRAST:  140mL OMNIPAQUE IOHEXOL 350 MG/ML SOLN  COMPARISON:  Chest x-ray 12/20/2013  FINDINGS: There is an massive thyroid goiter extending well up into the supraclavicular regions and down into the mediastinum. There are enlarged supraclavicular, subpectoral and axillary lymph nodes on the right worrisome for metastatic disease. Lymph node biopsy is recommended.  The bony thorax is intact.  No destructive bone lesions.  The heart is mildly enlarged. No pericardial effusion. No mediastinal or hilar lymphadenopathy. The substernal goiter has mass effect on the aorta and branch vessels along with the trachea and esophagus. The aorta is normal in caliber. No dissection.  The pulmonary arterial tree is fairly well opacified. No definite filling defects to suggest pulmonary emboli.  Examination of the lung parenchyma demonstrates areas of subpleural atelectasis. There is a small right pleural effusion and moderate right basilar atelectasis. There is focal eventration of the right hemidiaphragm.  The upper abdomen is grossly normal.  Review of the MIP images confirms the above findings.  IMPRESSION: Massive thyroid goiter worrisome for thyroid cancer given the adenopathy in the neck, supraclavicular, subpectoral and right axillary regions. Biopsy of the  thyroid mass or the lymph nodes may be necessary. This goiter is mass has significant mass effect on the mediastinal structures also.  No CT findings for pulmonary embolism.  Normal thoracic aorta.  Areas of atelectasis in the lungs but no definite infiltrates or edema.   Electronically Signed   By: Kalman Jewels M.D.   On: 12/20/2013 20:15  All radiology studies independently viewed by me.      EKG Interpretation   Date/Time:  Thursday December 20 2013 15:34:01 EDT Ventricular Rate:  72 PR Interval:  168 QRS Duration: 83 QT Interval:  326 QTC Calculation: 357 R Axis:   14 Text Interpretation:  Sinus rhythm Baseline wander in lead(s) I II aVR aVF  V1 V2 V4 V6 baseline wander Confirmed by [facility]  MD, TREY ([id number]) on  12/20/2013 3:52:37 PM Also confirmed by [facility]  MD, TREY ([id number])  on  12/20/2013 3:53:15 PM      MDM   Final diagnoses:  Thyroid mass  Hypoxia  Acute respiratory failure with hypoxia and hypercarbia  Peripheral edema    80 yo female presenting from PCP office to evaluate 3 limb edema and slurred speech.  Also hypoxic, but not distressed initially.  CT chest showed thyroid mass with extension to mediastinum with compression of mediastinal structures.  I spoke with Dr. Redmond Baseman (ENT) regarding estimated 50% compression of trachea.  He will evaluate her in the morning.  No evidence of immediate airway collapse (no stridor, good air movement, etc.)  However, she became somnolent during her ED course, prompting eval with ABG.  This showed hypercarbic respiratory failure.  Started on bipap with some initial improvement in mental status.  Consulted critical care who will admit for further workup and close monitoring of airway/ventilatory status.      Arbie Cookey, MD 12/21/13 Accord III, MD 12/22/13 [phone]

## 2013-12-20 NOTE — ED Notes (Signed)
Attempts x 2 to obtain IV. Charge nurse notified.

## 2013-12-20 NOTE — ED Notes (Signed)
Pt went to PCP today for cough and congestion, PCP sent pt over for picture of chest. Pt is also having swelling in the right arm and leg since March 21 after hip replacement and leg surgery. Denies pain.

## 2013-12-20 NOTE — Patient Instructions (Signed)
It was good to see you today.  We have reviewed your prior records including labs and tests today  Test(s) ordered today. Your results will be released to [facility] (or called to you) after review, usually within 72hours after test completion. If any changes need to be made, you will be notified at that same time.  Medications reviewed and updated, no changes recommended at this time.  we'll make referral to [facility] as discussed . Our office will contact you regarding appointment(s) once made.  Please schedule followup in 2 weeks, call sooner if problems.

## 2013-12-20 NOTE — Progress Notes (Signed)
Subjective:    Patient ID: Shawna Fuentes, female    DOB: 18-Aug-1932, 80 y.o.   MRN: [id number]  HPI  Patient is here for follow up  Reviewed chronic medical issues and interval medical events  Past Medical History  Diagnosis Date  . Arthritis   . Hyperlipidemia   . Hypertension   . History of kidney stones     Review of Systems  Constitutional: Positive for fatigue. Negative for unexpected weight change.  Respiratory: Positive for cough (x 6 weeks).   Cardiovascular: Positive for leg swelling (BLE and R hand x 2 weeks). Negative for chest pain.  Musculoskeletal: Positive for arthralgias, back pain (chronic), gait problem (chronic, but worse in past 3 weeks since DC home from SNF) and myalgias.  Neurological: Positive for speech difficulty (x 2 weeks - "slurred" and slow) and weakness (generalized). Negative for dizziness, numbness and headaches.  Psychiatric/Behavioral: Positive for hallucinations (visual and auditory x 1 week), confusion and decreased concentration. Negative for sleep disturbance (hypersomnia x 2 weeks) and agitation. The patient is not nervous/anxious.        Objective:   Physical Exam  BP 132/80  Pulse 70  Temp(Src) 98.6 F (37 C) (Oral)  SpO2 88% Wt Readings from Last 3 Encounters:  09/22/13 272 lb (123.378 kg)  09/22/13 272 lb (123.378 kg)  12/29/12 281 lb 3.2 oz (127.551 kg)   Constitutional: She is obese, but appears well-developed and well-nourished. No acute distress. Family at side (son donald and dtr) Neck: thick - prominent trachea. Normal range of motion. Neck supple. No JVD present. No thyromegaly present.  Cardiovascular: Distant -Normal rate, regular rhythm and normal heart sounds.  No murmur heard. 1+ BLE edema. Pulmonary/Chest: Effort normal and breath sounds diminished at bases. No respiratory distress. She has no wheezes. Neuro: slurred and deliberate speech. Cranial nerves II through XII symmetrically intact but decreased nasolabial  fold on left. Symmetric and equal handgrip, Gait not tested as unable to walk, B diminished hip flexor strength, and reports diminished sensation of strength on left lower extremity for 3 weeks -  Psychiatric: She has a normal mood and affect. Her behavior is normal. Judgment and thought content normal.   Lab Results  Component Value Date   WBC 8.4 12/12/2013   HGB 10.2* 12/12/2013   HCT 32.3* 12/12/2013   PLT 191.0 12/12/2013   GLUCOSE 81 12/12/2013   CHOL 140 12/29/2012   TRIG 61.0 12/29/2012   HDL 63.80 12/29/2012   LDLCALC 64 12/29/2012   ALT 11 12/12/2013   AST 15 12/12/2013   NA 131* 12/12/2013   K 3.8 12/12/2013   CL 92* 12/12/2013   CREATININE 0.9 12/12/2013   BUN 17 12/12/2013   CO2 32 12/12/2013   TSH 0.46 12/12/2013   HGBA1C 5.7* 09/23/2013    Dg Chest 1 View  12/12/2013   CLINICAL DATA:  Edema, right leg and arm swelling  EXAM: CHEST - 1 VIEW  COMPARISON:  09/22/2013  FINDINGS: Again noted soft tissue prominence in the mediastinum suspicious for mediastinal mass or adenopathy. Further correlation with CT scan of the chest is recommended. Elevation of the right hemidiaphragm. Streaky right basilar atelectasis or infiltrate.  IMPRESSION: Again noted soft tissue prominence in mediastinum suspicious for mass or adenopathy. Further evaluation with enhanced CT scan of the chest is recommended. Streaky right basilar atelectasis or infiltrate. No pulmonary edema.  This was made a call report   Electronically Signed   By: Orlean Bradford.D.  On: 12/12/2013 17:20       Assessment & Plan:   Slurring and progressive weakness x 3 weeks Hallucinations x one week Hypersomnia Right hand edema FTT Abnormal chest x-ray Hyponatremia  Brain CT head and chest, concern for lung cancer and or brain metastases Unable to arrange as outpatient, therefore patient going to [facility] ED for further evaluation and treatment of same - spoke with Dr Wilson Singer re: same

## 2013-12-20 NOTE — Progress Notes (Signed)
Pre visit review using our clinic review tool, if applicable. No additional management support is needed unless otherwise documented below in the visit note. 

## 2013-12-20 NOTE — ED Notes (Signed)
Family at bedside. 

## 2013-12-20 NOTE — ED Notes (Signed)
Charge nurse unable to obtain IV and/or blood work. Charge nurse notified EDP.

## 2013-12-20 NOTE — ED Notes (Signed)
Patient transported to CT 

## 2013-12-20 NOTE — Progress Notes (Signed)
  CARE MANAGEMENT ED NOTE 12/20/2013  Patient:  Shawna Fuentes, Shawna Fuentes   Account Number:  [id number]  Date Initiated:  12/20/2013  Documentation initiated by:  Livia Snellen  Subjective/Objective Assessment:   Patient presents to Ed with increased shortness of breath, AMS, cough, slurred speech decreased O2 saturations.     Subjective/Objective Assessment Detail:   ABG PCO2 86  CTA of chest; Massive thyroid goiter worrisome for thyroid cancer given the  adenopathy in the neck, supraclavicular, subpectoral and right  axillary regions. Biopsy of the thyroid mass or the lymph nodes may  be necessary. This goiter is mass has significant mass effect on the  mediastinal structures also.     Action/Plan:   Patient to be admitted.   Action/Plan Detail:   Anticipated DC Date:       Status Recommendation to Physician:   Result of Recommendation:    Other ED Services  Consult Working [facility]  CM consult  Other    Choice offered to / List presented to:  C-4 Adult Children          Status of service:  Completed, signed off  ED Comments:   ED Comments Detail:  EDCM spoke to patient's daughters, Barbara Cower and [name] at bedside.  Patient too lethargic to speak.  Patient's daughters report patient lives at home with her son [name] [phone].  Patient is bed bound at home. Patient has a hospital bed, a wheelchair, bsc, cane, and walker at home. Patient noted to be wearing oxygen in the ED, patient does not wear oxygen at home.  Patient's daughters reports patient is receiving home health services with Alvis Lemmings, RN, PT, OT.  Patient's family would like an aide.  EDCM provided patient's daughters with a list of home health agencies in [place] highlighting [place] [facility].  EDCM also provided patient's family with a printed list of private duty nursing agencies.  Patient's daughters report they have just applied for Medicaid for the patient. EDCM has received permission to  place [facility] consult as Dr. Gwendolyn Grant isher pcp and is a participating [facility].  Patient's daughters thankful for assistance.  No further EDCM needs at this time.

## 2013-12-20 NOTE — Progress Notes (Signed)
CRITICAL VALUE ALERT  Critical value received: ABG  Date of notification:  12/20/13  Time of notification:  2221  Critical value read back: yes  Nurse who received alert:  Webb Laws  MD notified (1st page):  Dr. Tyrell Antonio  Time of first page: 2221  MD notified (2nd page):  Time of second page:  Responding MD: Dr. Tyrell Antonio  Time MD responded:  2222

## 2013-12-20 NOTE — H&P (Signed)
[facility] History and Physical  Shawna Fuentes VOJ:[id number] DOB: 1932-08-18 DOA: 12/20/2013  Referring physician: Dr Doy Mince PCP: Gwendolyn Grant, MD   Chief Complaint: AMS, cough, SOB.   HPI: Shawna Fuentes is a 80 y.o. female with PMH significant for recent subtrochanteric, right femur, closed 09-25-2013, hypertension, hyperlipidemia, brought to the ED by family due to AMS, worsening cough, shortness of breath, intermittent confusion, and slurred speech intermittent for last week. Patient has had a cough for last 2 weeks, no fevers.She saw her PCP today who refer her to ED for further evaluation.  Per family patient has had difficulty with swallowing since she had surgery. Patient relates she is breathing better since she is on oxygen. Per family patient has a lot secretions specially at nights.   Patient also with swelling of lower extremities, getting worse. Also swelling of right upper  Extremity since 2 to 3 weeks.   Evaluation in the ED: CT chest angio show: Massive thyroid goiter worrisome for thyroid cancer given the  adenopathy in the neck, supraclavicular, subpectoral and right axillary regions. Biopsy of the thyroid mass or the lymph nodes may be necessary. This goiter is mass has significant mass effect on the mediastinal structures also. No CT findings for pulmonary embolism.     Review of Systems:  Negative, except as per HPI.   Past Medical History  Diagnosis Date  . Arthritis   . Hyperlipidemia   . Hypertension   . History of kidney stones    Past Surgical History  Procedure Laterality Date  . No past surgeries    . Hip surgery    . Femur im nail Right 09/22/2013    Procedure: INTRAMEDULLARY (IM) NAIL FEMORAL;  Surgeon: Wylene Simmer, MD;  Location: [place];  Service: Orthopedics;  Laterality: Right;   Social History:  reports that she has never smoked. She does not have any smokeless tobacco history on file. She reports that she does not drink alcohol or use  illicit drugs.  Allergies  Allergen Reactions  . Oxycodone     goofiness  . Tetanus Toxoids     Pt declines    Family History  Problem Relation Age of Onset  . Osteoarthritis Mother   . Hypertension Mother   . Hypertension Father   . Diabetes Son   . Diabetes Son   . Cancer Father     unknown type     Prior to Admission medications   Medication Sig Start Date End Date Taking? Authorizing Provider  acetaminophen (TYLENOL) 500 MG tablet Take 500 mg by mouth every 6 (six) hours as needed for moderate pain.   Yes Historical Provider, MD  aspirin 325 MG tablet Take 325 mg by mouth daily.   Yes Historical Provider, MD  atorvastatin (LIPITOR) 10 MG tablet Take 10 mg by mouth every evening.   Yes Historical Provider, MD  ferrous sulfate 325 (65 FE) MG tablet Take 325 mg by mouth 3 (three) times daily with meals.   Yes Historical Provider, MD  furosemide (LASIX) 20 MG tablet Take 1 tablet (20 mg total) by mouth daily. 12/12/13  Yes Biagio Borg, MD  metoprolol succinate (TOPROL-XL) 100 MG 24 hr tablet Take 100 mg by mouth every morning. Take with or immediately following a meal.   Yes Historical Provider, MD  sulindac (CLINORIL) 200 MG tablet Take 200 mg by mouth 2 (two) times daily.   Yes Historical Provider, MD  valsartan (DIOVAN) 160 MG tablet Take 1 tablet (160  mg total) by mouth daily. 12/12/13  Yes Biagio Borg, MD  valsartan-hydrochlorothiazide (DIOVAN-HCT) 160-25 MG per tablet Take 1 tablet by mouth daily.  12/04/13  Yes Historical Provider, MD  Vitamin D, Ergocalciferol, (DRISDOL) 50000 UNITS CAPS capsule Take 50,000 Units by mouth every 7 (seven) days. Takes on Thursday   Yes Historical Provider, MD   Physical Exam: Filed Vitals:   12/20/13 2009  BP: 158/133  Pulse: 72  Temp: 97.9 F (36.6 C)  Resp: 22    BP 158/133  Pulse 72  Temp(Src) 97.9 F (36.6 C) (Oral)  Resp 22  SpO2 99%  General:  Lethargic, open eye to voice, answer some question.  Eyes: PERRL, normal lids,  irises & conjunctiva ENT: grossly normal hearing, lips & tongue.  Neck: no LAD, thyromegaly, significant swelling of neck.  Cardiovascular: RRR, no m/r/g.  Respiratory: Bilateral ronchus, no crackles.  Abdomen: soft, ntnd Skin: no rash or induration seen on limited exam Musculoskeletal: grossly normal tone BUE/BLE. Bilateral LE edema, and right upper extremity edema.  Neurologic: Speech is soft, lethargic, open eyes to voice,           Labs on Admission:  Basic Metabolic Panel:  Recent Labs Lab 12/20/13 1823  NA 130*  K 4.1  CL 89*  CO2 31  GLUCOSE 90  BUN 15  CREATININE 0.91  CALCIUM 9.3   Liver Function Tests:  Recent Labs Lab 12/20/13 1823  AST 13  ALT 6  ALKPHOS 101  BILITOT 1.9*  PROT 6.6  ALBUMIN 3.1*   No results found for this basename: LIPASE, AMYLASE,  in the last 168 hours No results found for this basename: AMMONIA,  in the last 168 hours CBC:  Recent Labs Lab 12/20/13 1823  WBC 8.4  NEUTROABS 5.8  HGB 10.6*  HCT 34.0*  MCV 82.1  PLT 181   Cardiac Enzymes:  Recent Labs Lab 12/20/13 1823  TROPONINI <0.30    BNP (last 3 results)  Recent Labs  12/20/13 1823  PROBNP 1286.0*   CBG: No results found for this basename: GLUCAP,  in the last 168 hours  Radiological Exams on Admission: Dg Chest 1 View  12/20/2013   CLINICAL DATA:  Cough and congestion  EXAM: CHEST - 1 VIEW  COMPARISON:  AP chest X ray of December 12, 2013  FINDINGS: The rib right hemidiaphragm is further elevated today than on the previous study. Basilar atelectatic change is suspected. Soft tissue fullness in the right paratracheal region is unchanged. The cardiac silhouette is mildly enlarged though stable. The pulmonary vascularity is mildly prominent but also stable.  IMPRESSION: 1. The elevated right hemidiaphragm has become more conspicuous. Persistent basilar atelectasis on the right is present. 2. Low grade compensated CHF is suspected but not greatly changed from the  previous study. 3. There is stable right upper paratracheal soft tissue density worrisome for an abnormal paratracheal mass. Chest CT scanning is planned.   Electronically Signed   By: David  Martin[place]   On: 12/20/2013 16:46   Dg Hip Complete Right  12/20/2013   CLINICAL DATA:  Right leg swelling. For preceding 2 and half months following hip replacement.  EXAM: RIGHT HIP - COMPLETE 2+ VIEW  COMPARISON:  Femur films of September 23, 2013.  FINDINGS: There is an ununited fracture of the sub trochanteric diaphysis of the right femur. An intra medullary rod with telescoping screw present and stable. The overlying soft tissues where visualized are unremarkable. The hip joint space is reasonably well  maintained. The bony pelvis is osteopenic. There is severe degenerative change of the left hip.  IMPRESSION: The proximal femoral diaphyseal fracture remains clearly visible without obvious periosteal reaction. The intramedullary rod and telescoping screw are unchanged in position.   Electronically Signed   By: David  [place]   On: 12/20/2013 16:43   Ct Head Wo Contrast  12/20/2013   CLINICAL DATA:  Weakness.  Confusion.  EXAM: CT HEAD WITHOUT CONTRAST  TECHNIQUE: Contiguous axial images were obtained from the base of the skull through the vertex without intravenous contrast.  COMPARISON:  None.  FINDINGS: No mass. No hydrocephalus. No hemorrhage. Diffuse mild atrophy present. No acute bony abnormality identified. Visualized paranasal sinuses and mastoids are normal.  IMPRESSION: No acute abnormality.   Electronically Signed   By: [place]   On: 12/20/2013 17:38   Ct Angio Chest Pe W/cm &/or Wo Cm  12/20/2013   CLINICAL DATA:  Chest pain and cough.  EXAM: CT ANGIOGRAPHY CHEST WITH CONTRAST  TECHNIQUE: Multidetector CT imaging of the chest was performed using the standard protocol during bolus administration of intravenous contrast. Multiplanar CT image reconstructions and MIPs were obtained to evaluate the vascular  anatomy.  CONTRAST:  115mL OMNIPAQUE IOHEXOL 350 MG/ML SOLN  COMPARISON:  Chest x-ray 12/20/2013  FINDINGS: There is an massive thyroid goiter extending well up into the supraclavicular regions and down into the mediastinum. There are enlarged supraclavicular, subpectoral and axillary lymph nodes on the right worrisome for metastatic disease. Lymph node biopsy is recommended.  The bony thorax is intact.  No destructive bone lesions.  The heart is mildly enlarged. No pericardial effusion. No mediastinal or hilar lymphadenopathy. The substernal goiter has mass effect on the aorta and branch vessels along with the trachea and esophagus. The aorta is normal in caliber. No dissection.  The pulmonary arterial tree is fairly well opacified. No definite filling defects to suggest pulmonary emboli.  Examination of the lung parenchyma demonstrates areas of subpleural atelectasis. There is a small right pleural effusion and moderate right basilar atelectasis. There is focal eventration of the right hemidiaphragm.  The upper abdomen is grossly normal.  Review of the MIP images confirms the above findings.  IMPRESSION: Massive thyroid goiter worrisome for thyroid cancer given the adenopathy in the neck, supraclavicular, subpectoral and right axillary regions. Biopsy of the thyroid mass or the lymph nodes may be necessary. This goiter is mass has significant mass effect on the mediastinal structures also.  No CT findings for pulmonary embolism.  Normal thoracic aorta.  Areas of atelectasis in the lungs but no definite infiltrates or edema.   Electronically Signed   By: Kalman Jewels M.D.   On: 12/20/2013 20:15    EKG: Independently reviewed. Sinus rhythm.   Assessment/Plan Active Problems:   Hyperlipidemia   Hypertension   Edema   Hypoxemia   Encephalopathy acute  1-Hypoxic Respiratory failure; CT angio negative for PE.   2-Thyroid Mass, with mass effect on the mediastinal structures also: will check TSH, Free T  3 and T 4.   3-UTI; needs antibiotics.   4- Encephalopathy: probably related to hypercapnia, infection.   5-Hypertension: hold medications.    Patient ABG showed hypercapnia, PCO2 at 80 ph 7.2.  CCM will take over.   Code Status: full code.  Family Communication: care discussed with son and daughter who were at bedside.  Disposition Plan: expect more than 3 days.   Time spent: 75 minutes.   Nedra Mcinnis, [employer] [facility] operational officer  [phone]  **Disclaimer: This note may have been dictated with voice recognition software. Similar sounding words can inadvertently be transcribed and this note may contain transcription errors which may not have been corrected upon publication of note.**

## 2013-12-21 ENCOUNTER — Ambulatory Visit (HOSPITAL_COMMUNITY): Payer: Medicare HMO

## 2013-12-21 ENCOUNTER — Other Ambulatory Visit (HOSPITAL_COMMUNITY): Payer: Medicare HMO

## 2013-12-21 DIAGNOSIS — E079 Disorder of thyroid, unspecified: Secondary | ICD-10-CM

## 2013-12-21 DIAGNOSIS — J9600 Acute respiratory failure, unspecified whether with hypoxia or hypercapnia: Secondary | ICD-10-CM

## 2013-12-21 DIAGNOSIS — R262 Difficulty in walking, not elsewhere classified: Secondary | ICD-10-CM

## 2013-12-21 DIAGNOSIS — S72009S Fracture of unspecified part of neck of unspecified femur, sequela: Secondary | ICD-10-CM

## 2013-12-21 DIAGNOSIS — T8140XA Infection following a procedure, unspecified, initial encounter: Secondary | ICD-10-CM

## 2013-12-21 DIAGNOSIS — M6281 Muscle weakness (generalized): Secondary | ICD-10-CM

## 2013-12-21 DIAGNOSIS — E890 Postprocedural hypothyroidism: Secondary | ICD-10-CM

## 2013-12-21 DIAGNOSIS — J189 Pneumonia, unspecified organism: Secondary | ICD-10-CM

## 2013-12-21 DIAGNOSIS — J9612 Chronic respiratory failure with hypercapnia: Secondary | ICD-10-CM | POA: Diagnosis present

## 2013-12-21 DIAGNOSIS — G9340 Encephalopathy, unspecified: Secondary | ICD-10-CM

## 2013-12-21 HISTORY — PX: THYROIDECTOMY WITH STERNOTOMY: SHX6122

## 2013-12-21 LAB — BLOOD GAS, ARTERIAL
ACID-BASE EXCESS: 4.5 mmol/L — AB (ref 0.0–2.0)
BICARBONATE: 33.5 meq/L — AB (ref 20.0–24.0)
Delivery systems: POSITIVE
Drawn by: 31814
FIO2: 0.4 %
O2 Saturation: 97.8 %
PCO2 ART: 80.4 mmHg — AB (ref 35.0–45.0)
PEEP: 5 cmH2O
PH ART: 7.24 — AB (ref 7.350–7.450)
PO2 ART: 120 mmHg — AB (ref 80.0–100.0)
Patient temperature: 97.9
Pressure control: 11 cmH2O
RATE: 22 resp/min
TCO2: 32.2 mmol/L (ref 0–100)

## 2013-12-21 LAB — TSH: TSH: 0.334 u[IU]/mL — AB (ref 0.350–4.500)

## 2013-12-21 LAB — T4, FREE: Free T4: 0.96 ng/dL (ref 0.80–1.80)

## 2013-12-21 LAB — PROCALCITONIN: Procalcitonin: <0.1 ng/mL

## 2013-12-21 LAB — T3, FREE: T3, Free: 2.4 pg/mL (ref 2.3–4.2)

## 2013-12-21 MED ORDER — DEXTROSE 5 % IV SOLN
500.0000 mg | INTRAVENOUS | Status: DC
  Administered 2013-12-21: 500 mg via INTRAVENOUS
  Filled 2013-12-21: qty 500

## 2013-12-21 MED ORDER — HEPARIN SODIUM (PORCINE) 5000 UNIT/ML IJ SOLN
5000.0000 [IU] | Freq: Three times a day (TID) | INTRAMUSCULAR | Status: DC
  Filled 2013-12-21 (×4): qty 1

## 2013-12-21 MED ORDER — CEFTRIAXONE SODIUM 2 G IJ SOLR
2.0000 g | INTRAMUSCULAR | Status: DC
  Administered 2013-12-21: 2 g via INTRAVENOUS
  Filled 2013-12-21: qty 2

## 2013-12-21 NOTE — ED Notes (Signed)
Dentures upper and lower given to pt's son

## 2013-12-21 NOTE — ED Notes (Signed)
MD at bedside. pulm MD

## 2013-12-21 NOTE — H&P (Signed)
PULMONARY / CRITICAL CARE MEDICINE  Name: Shawna Fuentes MRN: [id number] DOB: 13-Dec-1932    ADMISSION DATE:  12/20/2013 CONSULTATION DATE:  12/21/2013  REFERRING MD :  EDP PRIMARY SERVICE:  PCCM  CHIEF COMPLAINT:  Acute respiratory failure  BRIEF PATIENT DESCRIPTION: 80 yo seen by her PCP on 6/18 for cough,and reportedly, R arm and leg swelling.  Sent to [facility] ED where she was found to be hypercarbic and encephalopathic.  Placed on BiPAP.  SIGNIFICANT EVENTS / STUDIES:  6/18  Admitted with cough, congestion, hypercarbia 6/18  Chest CTA >>>  Massive goiter with adenopathy and mass effect on trachea and mediastinal structures 6/18  Head CT >>> nad 6/19  Venous Dopplers >>>  LINES / TUBES:  CULTURES:  ANTIBIOTICS:  The patient is encephalopathic and unable to provide history, which was obtained for available medical records.  HISTORY OF PRESENT ILLNESS:  80 yo seen by her PCP on 6/18 for cough,and reportedly, R arm and leg swelling.  Sent to [facility] ED where she was found to be hypercarbic and encephalopathic.  Placed on BiPAP.  PAST MEDICAL HISTORY :  Past Medical History  Diagnosis Date  . Arthritis   . Hyperlipidemia   . Hypertension   . History of kidney stones    Past Surgical History  Procedure Laterality Date  . No past surgeries    . Hip surgery    . Femur im nail Right 09/22/2013    Procedure: INTRAMEDULLARY (IM) NAIL FEMORAL;  Surgeon: Wylene Simmer, MD;  Location: [place];  Service: Orthopedics;  Laterality: Right;   Prior to Admission medications   Medication Sig Start Date End Date Taking? Authorizing Provider  acetaminophen (TYLENOL) 500 MG tablet Take 500 mg by mouth every 6 (six) hours as needed for moderate pain.   Yes Historical Provider, MD  aspirin 325 MG tablet Take 325 mg by mouth daily.   Yes Historical Provider, MD  atorvastatin (LIPITOR) 10 MG tablet Take 10 mg by mouth every evening.   Yes Historical Provider, MD  ferrous sulfate 325 (65 FE) MG tablet Take 325  mg by mouth 3 (three) times daily with meals.   Yes Historical Provider, MD  furosemide (LASIX) 20 MG tablet Take 1 tablet (20 mg total) by mouth daily. 12/12/13  Yes Biagio Borg, MD  metoprolol succinate (TOPROL-XL) 100 MG 24 hr tablet Take 100 mg by mouth every morning. Take with or immediately following a meal.   Yes Historical Provider, MD  sulindac (CLINORIL) 200 MG tablet Take 200 mg by mouth 2 (two) times daily.   Yes Historical Provider, MD  valsartan (DIOVAN) 160 MG tablet Take 1 tablet (160 mg total) by mouth daily. 12/12/13  Yes Biagio Borg, MD  valsartan-hydrochlorothiazide (DIOVAN-HCT) 160-25 MG per tablet Take 1 tablet by mouth daily.  12/04/13  Yes Historical Provider, MD  Vitamin D, Ergocalciferol, (DRISDOL) 50000 UNITS CAPS capsule Take 50,000 Units by mouth every 7 (seven) days. Takes on Thursday   Yes Historical Provider, MD   Allergies  Allergen Reactions  . Oxycodone     goofiness  . Tetanus Toxoids     Pt declines   FAMILY HISTORY:  Family History  Problem Relation Age of Onset  . Osteoarthritis Mother   . Hypertension Mother   . Hypertension Father   . Diabetes Son   . Diabetes Son   . Cancer Father     unknown type   SOCIAL HISTORY:  reports that she has never smoked. She  does not have any smokeless tobacco history on file. She reports that she does not drink alcohol or use illicit drugs.  REVIEW OF SYSTEMS:  Unable to provide.  INTERVAL HISTORY:  VITAL SIGNS: Temp:  [97.9 F (36.6 C)-98.6 F (37 C)] 97.9 F (36.6 C) (06/18 2009) Pulse Rate:  [60-72] 60 (06/18 2314) Resp:  [12-25] 25 (06/18 2314) BP: (130-158)/(58-133) 139/60 mmHg (06/18 2314) SpO2:  [88 %-100 %] 100 % (06/18 2314) FiO2 (%):  [3 %-40 %] 40 % (06/18 2314)  HEMODYNAMICS:   VENTILATOR SETTINGS: Vent Mode:  [-] Other (Comment) FiO2 (%):  [3 %-40 %] 40 % PEEP:  [5 cmH20] 5 cmH20 INTAKE / OUTPUT: Intake/Output   None    PHYSICAL EXAMINATION: General: No distress, ventilated with  BiPAP Neuro:  Encephalopathic HEENT:  BiPAP mask on, neck mass Cardiovascular:  Bradycardic, regular Lungs:  Bilateral diminished air entry, no added sounds Abdomen:  Obese, soft Musculoskeletal:  Bilateral lower and right upper extremity edema Skin:  Intact  LABS: CBC  Recent Labs Lab 12/20/13 1823  WBC 8.4  HGB 10.6*  HCT 34.0*  PLT 181   Coag's No results found for this basename: APTT, INR,  in the last 168 hours BMET  Recent Labs Lab 12/20/13 1823  NA 130*  K 4.1  CL 89*  CO2 31  BUN 15  CREATININE 0.91  GLUCOSE 90   Electrolytes  Recent Labs Lab 12/20/13 1823  CALCIUM 9.3   Sepsis Markers No results found for this basename: LATICACIDVEN, PROCALCITON, O2SATVEN,  in the last 168 hours ABG  Recent Labs Lab 12/20/13 2154  PHART 7.216*  PCO2ART 86.0*  PO2ART 107.0*   Liver Enzymes  Recent Labs Lab 12/20/13 1823  AST 13  ALT 6  ALKPHOS 101  BILITOT 1.9*  ALBUMIN 3.1*   Cardiac Enzymes  Recent Labs Lab 12/20/13 1823  TROPONINI <0.30  PROBNP 1286.0*   Glucose No results found for this basename: GLUCAP,  in the last 168 hours  IMAGING:  Dg Chest 1 View  12/20/2013   CLINICAL DATA:  Cough and congestion  EXAM: CHEST - 1 VIEW  COMPARISON:  AP chest X ray of December 12, 2013  FINDINGS: The rib right hemidiaphragm is further elevated today than on the previous study. Basilar atelectatic change is suspected. Soft tissue fullness in the right paratracheal region is unchanged. The cardiac silhouette is mildly enlarged though stable. The pulmonary vascularity is mildly prominent but also stable.  IMPRESSION: 1. The elevated right hemidiaphragm has become more conspicuous. Persistent basilar atelectasis on the right is present. 2. Low grade compensated CHF is suspected but not greatly changed from the previous study. 3. There is stable right upper paratracheal soft tissue density worrisome for an abnormal paratracheal mass. Chest CT scanning is planned.    Electronically Signed   By: David  [place]   On: 12/20/2013 16:46   Dg Hip Complete Right  12/20/2013   CLINICAL DATA:  Right leg swelling. For preceding 2 and half months following hip replacement.  EXAM: RIGHT HIP - COMPLETE 2+ VIEW  COMPARISON:  Femur films of September 23, 2013.  FINDINGS: There is an ununited fracture of the sub trochanteric diaphysis of the right femur. An intra medullary rod with telescoping screw present and stable. The overlying soft tissues where visualized are unremarkable. The hip joint space is reasonably well maintained. The bony pelvis is osteopenic. There is severe degenerative change of the left hip.  IMPRESSION: The proximal femoral diaphyseal fracture remains  clearly visible without obvious periosteal reaction. The intramedullary rod and telescoping screw are unchanged in position.   Electronically Signed   By: David  [place]   On: 12/20/2013 16:43   Ct Head Wo Contrast  12/20/2013   CLINICAL DATA:  Weakness.  Confusion.  EXAM: CT HEAD WITHOUT CONTRAST  TECHNIQUE: Contiguous axial images were obtained from the base of the skull through the vertex without intravenous contrast.  COMPARISON:  None.  FINDINGS: No mass. No hydrocephalus. No hemorrhage. Diffuse mild atrophy present. No acute bony abnormality identified. Visualized paranasal sinuses and mastoids are normal.  IMPRESSION: No acute abnormality.   Electronically Signed   By: [place]   On: 12/20/2013 17:38   Ct Angio Chest Pe W/cm &/or Wo Cm  12/20/2013   CLINICAL DATA:  Chest pain and cough.  EXAM: CT ANGIOGRAPHY CHEST WITH CONTRAST  TECHNIQUE: Multidetector CT imaging of the chest was performed using the standard protocol during bolus administration of intravenous contrast. Multiplanar CT image reconstructions and MIPs were obtained to evaluate the vascular anatomy.  CONTRAST:  158mL OMNIPAQUE IOHEXOL 350 MG/ML SOLN  COMPARISON:  Chest x-ray 12/20/2013  FINDINGS: There is an massive thyroid goiter extending well  up into the supraclavicular regions and down into the mediastinum. There are enlarged supraclavicular, subpectoral and axillary lymph nodes on the right worrisome for metastatic disease. Lymph node biopsy is recommended.  The bony thorax is intact.  No destructive bone lesions.  The heart is mildly enlarged. No pericardial effusion. No mediastinal or hilar lymphadenopathy. The substernal goiter has mass effect on the aorta and branch vessels along with the trachea and esophagus. The aorta is normal in caliber. No dissection.  The pulmonary arterial tree is fairly well opacified. No definite filling defects to suggest pulmonary emboli.  Examination of the lung parenchyma demonstrates areas of subpleural atelectasis. There is a small right pleural effusion and moderate right basilar atelectasis. There is focal eventration of the right hemidiaphragm.  The upper abdomen is grossly normal.  Review of the MIP images confirms the above findings.  IMPRESSION: Massive thyroid goiter worrisome for thyroid cancer given the adenopathy in the neck, supraclavicular, subpectoral and right axillary regions. Biopsy of the thyroid mass or the lymph nodes may be necessary. This goiter is mass has significant mass effect on the mediastinal structures also.  No CT findings for pulmonary embolism.  Normal thoracic aorta.  Areas of atelectasis in the lungs but no definite infiltrates or edema.   Electronically Signed   By: Kalman Jewels M.D.   On: 12/20/2013 20:15   ASSESSMENT / PLAN:  PULMONARY A:   Acute on chronic respiratory failure Tracheal stenosis secondary to massive goiter Probable OSA / OHS Propable difficult airway Possible CAP PE ruled out P:   Goal SpO2>92, pH>7.28 BiPAP / NIMV Repeat ABG May need intubation [facility]iox? Dr. Redmond Baseman at bedside, considering transfer to [place] A:  Probable acute on chronic systolic / diastolic congestive heart failure Possible DVT BL lower an R upper  extremity Hypotension, suspect non invasive measurement inaccurate  P:  Goal SBP>100 TTE Hold ASA, Lipitor, Metoprolol, HCTZ, Diovan Venous Dopplers BL lower and R upper extremities  RENAL A:   Hyponatremia Fluid overload P:   Trend BMP Lasix when BP allows  GASTROINTESTINAL A:   At risk for aspiration GIPx is not required P:   NPO  HEMATOLOGIC A:   VTE Px P:  Trend CBC Heparin  INFECTIOUS A:   Possible CAP  P:   Cultures and antibiotics as above PCT  ENDOCRINE  A:   Massive goiter Thyroid CA? P:   IR bx thyroid / lymph node TSH, free t4, total t3  NEUROLOGIC A:   Acute encephalopathy, secondary to hypercarbia P:   Avoid sedatives Neuro checks  I have personally obtained history, examined patient, evaluated and interpreted laboratory and imaging results, reviewed medical records, formulated assessment / plan and placed orders.  CRITICAL CARE:  The patient is critically ill with multiple organ systems failure and requires high complexity decision making for assessment and support, frequent evaluation and titration of therapies, application of advanced monitoring technologies and extensive interpretation of multiple databases. Critical Care Time devoted to patient care services described in this note is 60 minutes.   Doree Fudge, MD Pulmonary and [place] Pager: [phone]  12/21/2013, 12:00 AM

## 2013-12-21 NOTE — Consult Note (Signed)
Reason for Consult:Thyroid mass Referring Physician: ER  Shawna Fuentes is an 80 y.o. female.  HPI: 80 year old female without significant past medical history has been having cough and excessive phlegm for the past few weeks.  She saw her PCP earlier yesterday and was sent to the ER for a chest x-ray that lead to a chest CT due to a widened mediastinum.  On CT, a massive thyroid mass was demonstrated with compression of the trachea.  She became somnolent in the ER and her pCO2 as found to be 86.  She was started on BiPAP.  Her right arm was also noted to be edematous.  Past Medical History  Diagnosis Date  . Arthritis   . Hyperlipidemia   . Hypertension   . History of kidney stones     Past Surgical History  Procedure Laterality Date  . No past surgeries    . Hip surgery    . Femur im nail Right 09/22/2013    Procedure: INTRAMEDULLARY (IM) NAIL FEMORAL;  Surgeon: Wylene Simmer, MD;  Location: [facility];  Service: Orthopedics;  Laterality: Right;    Family History  Problem Relation Age of Onset  . Osteoarthritis Mother   . Hypertension Mother   . Hypertension Father   . Diabetes Son   . Diabetes Son   . Cancer Father     unknown type    Social History:  reports that she has never smoked. She does not have any smokeless tobacco history on file. She reports that she does not drink alcohol or use illicit drugs.  Allergies:  Allergies  Allergen Reactions  . Oxycodone     goofiness  . Tetanus Toxoids     Pt declines    Medications: I have reviewed the patient's current medications.  Results for orders placed during the hospital encounter of 12/20/13 (from the past 48 hour(s))  CBC WITH DIFFERENTIAL     Status: Abnormal   Collection Time    12/20/13  6:23 PM      Result Value Ref Range   WBC 8.4  4.0 - 10.5 K/uL   RBC 4.14  3.87 - 5.11 MIL/uL   Hemoglobin 10.6 (*) 12.0 - 15.0 g/dL   HCT 34.0 (*) 36.0 - 46.0 %   MCV 82.1  78.0 - 100.0 fL   MCH 25.6 (*) 26.0 - 34.0 pg   MCHC  31.2  30.0 - 36.0 g/dL   RDW 16.9 (*) 11.5 - 15.5 %   Platelets 181  150 - 400 K/uL   Neutrophils Relative % 69  43 - 77 %   Neutro Abs 5.8  1.7 - 7.7 K/uL   Lymphocytes Relative 20  12 - 46 %   Lymphs Abs 1.6  0.7 - 4.0 K/uL   Monocytes Relative 7  3 - 12 %   Monocytes Absolute 0.6  0.1 - 1.0 K/uL   Eosinophils Relative 4  0 - 5 %   Eosinophils Absolute 0.4  0.0 - 0.7 K/uL   Basophils Relative 0  0 - 1 %   Basophils Absolute 0.0  0.0 - 0.1 K/uL  COMPREHENSIVE METABOLIC PANEL     Status: Abnormal   Collection Time    12/20/13  6:23 PM      Result Value Ref Range   Sodium 130 (*) 137 - 147 mEq/L   Potassium 4.1  3.7 - 5.3 mEq/L   Chloride 89 (*) 96 - 112 mEq/L   CO2 31  19 -  32 mEq/L   Glucose, Bld 90  70 - 99 mg/dL   BUN 15  6 - 23 mg/dL   Creatinine, Ser 0.91  0.50 - 1.10 mg/dL   Calcium 9.3  8.4 - 10.5 mg/dL   Total Protein 6.6  6.0 - 8.3 g/dL   Albumin 3.1 (*) 3.5 - 5.2 g/dL   AST 13  0 - 37 U/L   ALT 6  0 - 35 U/L   Alkaline Phosphatase 101  39 - 117 U/L   Total Bilirubin 1.9 (*) 0.3 - 1.2 mg/dL   GFR calc non Af Amer 59 (*) >90 mL/min   GFR calc Af Amer 68 (*) >90 mL/min   Comment: (NOTE)     The eGFR has been calculated using the CKD EPI equation.     This calculation has not been validated in all clinical situations.     eGFR's persistently <90 mL/min signify possible Chronic Kidney     Disease.  TROPONIN I     Status: None   Collection Time    12/20/13  6:23 PM      Result Value Ref Range   Troponin I <0.30  <0.30 ng/mL   Comment:            Due to the release kinetics of cTnI,     a negative result within the first hours     of the onset of symptoms does not rule out     myocardial infarction with certainty.     If myocardial infarction is still suspected,     repeat the test at appropriate intervals.  PRO B NATRIURETIC PEPTIDE     Status: Abnormal   Collection Time    12/20/13  6:23 PM      Result Value Ref Range   Pro B Natriuretic peptide (BNP) 1286.0  (*) 0 - 450 pg/mL  URINALYSIS, ROUTINE W REFLEX MICROSCOPIC     Status: Abnormal   Collection Time    12/20/13  9:19 PM      Result Value Ref Range   Color, Urine YELLOW  YELLOW   APPearance CLOUDY (*) CLEAR   Specific Gravity, Urine >1.046 (*) 1.005 - 1.030   pH 5.5  5.0 - 8.0   Glucose, UA NEGATIVE  NEGATIVE mg/dL   Hgb urine dipstick TRACE (*) NEGATIVE   Bilirubin Urine NEGATIVE  NEGATIVE   Ketones, ur NEGATIVE  NEGATIVE mg/dL   Protein, ur NEGATIVE  NEGATIVE mg/dL   Urobilinogen, UA 1.0  0.0 - 1.0 mg/dL   Nitrite NEGATIVE  NEGATIVE   Leukocytes, UA MODERATE (*) NEGATIVE  URINE MICROSCOPIC-ADD ON     Status: None   Collection Time    12/20/13  9:19 PM      Result Value Ref Range   WBC, UA 21-50  <3 WBC/hpf   RBC / HPF 0-2  <3 RBC/hpf   Urine-Other MANY YEAST     Comment: AMORPHOUS URATES/PHOSPHATES  BLOOD GAS, ARTERIAL     Status: Abnormal   Collection Time    12/20/13  9:54 PM      Result Value Ref Range   FIO2 0.30     Delivery systems NASAL CANNULA     pH, Arterial 7.216 (*) 7.350 - 7.450   pCO2 arterial 86.0 (*) 35.0 - 45.0 mmHg   Comment: CRITICAL RESULT CALLED TO, READ BACK BY AND VERIFIED WITH:      Max Sane, RN AT [place], RRT, RCP ON 12/20/13  pO2, Arterial 107.0 (*) 80.0 - 100.0 mmHg   Bicarbonate 33.8 (*) 20.0 - 24.0 mEq/L   TCO2 32.5  0 - 100 mmol/L   Acid-Base Excess 4.1 (*) 0.0 - 2.0 mmol/L   O2 Saturation 96.7     Patient temperature 97.9     Collection site LEFT RADIAL     Drawn by [phone]     Sample type ARTERIAL     Allens test (pass/fail) PASS  PASS    Dg Chest 1 View  12/20/2013   CLINICAL DATA:  Cough and congestion  EXAM: CHEST - 1 VIEW  COMPARISON:  AP chest X ray of December 12, 2013  FINDINGS: The rib right hemidiaphragm is further elevated today than on the previous study. Basilar atelectatic change is suspected. Soft tissue fullness in the right paratracheal region is unchanged. The cardiac silhouette is mildly enlarged  though stable. The pulmonary vascularity is mildly prominent but also stable.  IMPRESSION: 1. The elevated right hemidiaphragm has become more conspicuous. Persistent basilar atelectasis on the right is present. 2. Low grade compensated CHF is suspected but not greatly changed from the previous study. 3. There is stable right upper paratracheal soft tissue density worrisome for an abnormal paratracheal mass. Chest CT scanning is planned.   Electronically Signed   By: David  [place]   On: 12/20/2013 16:46   Dg Hip Complete Right  12/20/2013   CLINICAL DATA:  Right leg swelling. For preceding 2 and half months following hip replacement.  EXAM: RIGHT HIP - COMPLETE 2+ VIEW  COMPARISON:  Femur films of September 23, 2013.  FINDINGS: There is an ununited fracture of the sub trochanteric diaphysis of the right femur. An intra medullary rod with telescoping screw present and stable. The overlying soft tissues where visualized are unremarkable. The hip joint space is reasonably well maintained. The bony pelvis is osteopenic. There is severe degenerative change of the left hip.  IMPRESSION: The proximal femoral diaphyseal fracture remains clearly visible without obvious periosteal reaction. The intramedullary rod and telescoping screw are unchanged in position.   Electronically Signed   By: David  [place]   On: 12/20/2013 16:43   Ct Head Wo Contrast  12/20/2013   CLINICAL DATA:  Weakness.  Confusion.  EXAM: CT HEAD WITHOUT CONTRAST  TECHNIQUE: Contiguous axial images were obtained from the base of the skull through the vertex without intravenous contrast.  COMPARISON:  None.  FINDINGS: No mass. No hydrocephalus. No hemorrhage. Diffuse mild atrophy present. No acute bony abnormality identified. Visualized paranasal sinuses and mastoids are normal.  IMPRESSION: No acute abnormality.   Electronically Signed   By: [place]   On: 12/20/2013 17:38   Ct Angio Chest Pe W/cm &/or Wo Cm  12/20/2013   CLINICAL DATA:  Chest  pain and cough.  EXAM: CT ANGIOGRAPHY CHEST WITH CONTRAST  TECHNIQUE: Multidetector CT imaging of the chest was performed using the standard protocol during bolus administration of intravenous contrast. Multiplanar CT image reconstructions and MIPs were obtained to evaluate the vascular anatomy.  CONTRAST:  1103m OMNIPAQUE IOHEXOL 350 MG/ML SOLN  COMPARISON:  Chest x-ray 12/20/2013  FINDINGS: There is an massive thyroid goiter extending well up into the supraclavicular regions and down into the mediastinum. There are enlarged supraclavicular, subpectoral and axillary lymph nodes on the right worrisome for metastatic disease. Lymph node biopsy is recommended.  The bony thorax is intact.  No destructive bone lesions.  The heart is mildly enlarged. No pericardial effusion. No mediastinal or hilar  lymphadenopathy. The substernal goiter has mass effect on the aorta and branch vessels along with the trachea and esophagus. The aorta is normal in caliber. No dissection.  The pulmonary arterial tree is fairly well opacified. No definite filling defects to suggest pulmonary emboli.  Examination of the lung parenchyma demonstrates areas of subpleural atelectasis. There is a small right pleural effusion and moderate right basilar atelectasis. There is focal eventration of the right hemidiaphragm.  The upper abdomen is grossly normal.  Review of the MIP images confirms the above findings.  IMPRESSION: Massive thyroid goiter worrisome for thyroid cancer given the adenopathy in the neck, supraclavicular, subpectoral and right axillary regions. Biopsy of the thyroid mass or the lymph nodes may be necessary. This goiter is mass has significant mass effect on the mediastinal structures also.  No CT findings for pulmonary embolism.  Normal thoracic aorta.  Areas of atelectasis in the lungs but no definite infiltrates or edema.   Electronically Signed   By: Kalman Jewels M.D.   On: 12/20/2013 20:15    Review of Systems  Unable to  perform ROS: mental acuity   Blood pressure 110/88, pulse 70, temperature 97.9 F (36.6 C), temperature source Oral, resp. rate 18, SpO2 100.00%. Physical Exam  Constitutional: She appears well-developed and well-nourished. No distress.  Somnolent on BiPAP.  HENT:  Head: Normocephalic and atraumatic.  Right Ear: External ear normal.  Left Ear: External ear normal.  Nose: Nose normal.  Mouth/Throat: Oropharynx is clear and moist.  No stridor.  Eyes: Conjunctivae and EOM are normal. Pupils are equal, round, and reactive to light.  Neck:  Massive firm mass in lower neck.  Cardiovascular: Normal rate.   Respiratory: Effort normal.  Musculoskeletal: Normal range of motion.  Neurological: No cranial nerve deficit.  Skin: Skin is warm and dry.    Assessment/Plan: Massive thyroid mass with possible cervical nodal disease, tracheal compression, hypercapnia I personally reviewed her chest CT showing the massive thyroid mass extending into the mediastinum to the aortic arch and tracheal compression.  I discussed her case with the hospitalist and recommended transfer to an academic center for further management.  She will require complex airway management and removal of the thyroid tumor may require sternotomy.  I am in the process of making arrangements with [place], DW[place] 12/21/2013, 12:50 AM

## 2013-12-21 NOTE — ED Notes (Signed)
MD at bedside.  ENT MD at bedside.  Pt's daughter is speaking to [facility] at this time.

## 2013-12-21 NOTE — ED Notes (Signed)
Family at bedside. 

## 2013-12-24 DIAGNOSIS — I82409 Acute embolism and thrombosis of unspecified deep veins of unspecified lower extremity: Secondary | ICD-10-CM | POA: Insufficient documentation

## 2013-12-26 LAB — T3: T3 TOTAL: 57.3 ng/dL — AB (ref 80.0–204.0)

## 2014-01-10 ENCOUNTER — Telehealth: Payer: Self-pay

## 2014-01-10 NOTE — Telephone Encounter (Signed)
PCP Notification received and faxed back.

## 2014-01-18 ENCOUNTER — Telehealth: Payer: Self-pay

## 2014-01-18 NOTE — Telephone Encounter (Signed)
Son contacted office. Stated he was returning call, message was left. No notes or instruction found to be communicated. Will call back with any new information.   Son did state that his mother had thyroidectomy with removal of goiter recently.

## 2014-02-12 ENCOUNTER — Ambulatory Visit (INDEPENDENT_AMBULATORY_CARE_PROVIDER_SITE_OTHER): Payer: Medicare HMO | Admitting: *Deleted

## 2014-02-12 ENCOUNTER — Telehealth: Payer: Self-pay | Admitting: Internal Medicine

## 2014-02-12 DIAGNOSIS — G4733 Obstructive sleep apnea (adult) (pediatric): Secondary | ICD-10-CM

## 2014-02-12 DIAGNOSIS — E662 Morbid (severe) obesity with alveolar hypoventilation: Secondary | ICD-10-CM

## 2014-02-12 LAB — POCT INR: INR: 2.6

## 2014-02-12 NOTE — Telephone Encounter (Signed)
Cindy with [facility] called.  Patients 2.6 INR today.  Patient takes 4 ml of coumadin a day.  This is what she was released from rehab on.  Patient does have a catheter and will need home health order signed.  Patient also needs sleep study scheduled patient is at home on oxygen.

## 2014-02-12 NOTE — Telephone Encounter (Signed)
1) please route this note to nurse managing our Coumadin clinic for further instructions on dosing as needed 2) I will sign HH order for catheter 3) I will refer to pulmonary for evaluation of sleep study and oxygen needs - [facility] will call with same

## 2014-02-12 NOTE — Telephone Encounter (Signed)
Spoke with Jenny Reichmann, RN and gave dosing instructions for Coumadin.

## 2014-02-13 ENCOUNTER — Telehealth: Payer: Self-pay | Admitting: *Deleted

## 2014-02-13 NOTE — Telephone Encounter (Signed)
Notified donald with md response...Shawna Fuentes

## 2014-02-13 NOTE — Telephone Encounter (Signed)
Left msg on triage with plan of care will be seeing pt 1 x weeks for 5 wks, 0 for 1 wk, 1 x for 1 wk for training & transfer, ADL's, home exercise...[name]

## 2014-02-13 NOTE — Telephone Encounter (Signed)
verbal okay

## 2014-02-18 ENCOUNTER — Telehealth: Payer: Self-pay | Admitting: *Deleted

## 2014-02-18 MED ORDER — WARFARIN SODIUM 4 MG PO TABS: 4.0000 mg | ORAL_TABLET | Freq: Every day | ORAL | Status: DC

## 2014-02-18 NOTE — Telephone Encounter (Signed)
Notified pt daughter with md response.../lmb 

## 2014-02-18 NOTE — Telephone Encounter (Signed)
Ok to stop sulindac while on warfarin

## 2014-02-18 NOTE — Telephone Encounter (Signed)
Daughter called and stated that mom received a letter from [facility] stating that she shouldn't be taking warfarin & sulindac together. Inform daughter warfarin is not on pt current med list she stated mom had surgery @ [facility] & it was rx then. Pls advise,.../lmb

## 2014-02-20 ENCOUNTER — Telehealth: Payer: Self-pay | Admitting: *Deleted

## 2014-02-20 ENCOUNTER — Ambulatory Visit (INDEPENDENT_AMBULATORY_CARE_PROVIDER_SITE_OTHER): Payer: Medicare HMO | Admitting: *Deleted

## 2014-02-20 LAB — POCT INR: INR: 4.1

## 2014-02-20 NOTE — Telephone Encounter (Signed)
Left msg on triage stating pt had her PT/INR check today it was 4.1. She currently taking 4 mg coumadin a day. Requesting md recommendation...[name]

## 2014-02-20 NOTE — Telephone Encounter (Signed)
See anticoag note

## 2014-02-20 NOTE — Telephone Encounter (Signed)
Please route to the RN doing our coumadin clinic to manage same thanks

## 2014-02-26 ENCOUNTER — Other Ambulatory Visit: Payer: Self-pay

## 2014-02-26 DIAGNOSIS — E2839 Other primary ovarian failure: Secondary | ICD-10-CM

## 2014-02-28 ENCOUNTER — Ambulatory Visit (INDEPENDENT_AMBULATORY_CARE_PROVIDER_SITE_OTHER): Payer: Medicare HMO | Admitting: Family Medicine

## 2014-02-28 LAB — POCT INR: INR: 2.5

## 2014-03-04 ENCOUNTER — Encounter: Payer: Self-pay | Admitting: Internal Medicine

## 2014-03-04 ENCOUNTER — Ambulatory Visit (INDEPENDENT_AMBULATORY_CARE_PROVIDER_SITE_OTHER): Payer: Commercial Managed Care - HMO | Admitting: Internal Medicine

## 2014-03-04 ENCOUNTER — Other Ambulatory Visit (INDEPENDENT_AMBULATORY_CARE_PROVIDER_SITE_OTHER): Payer: Commercial Managed Care - HMO

## 2014-03-04 VITALS — BP 140/70 | HR 77 | Temp 98.5°F

## 2014-03-04 DIAGNOSIS — R609 Edema, unspecified: Secondary | ICD-10-CM

## 2014-03-04 DIAGNOSIS — E079 Disorder of thyroid, unspecified: Secondary | ICD-10-CM

## 2014-03-04 DIAGNOSIS — I10 Essential (primary) hypertension: Secondary | ICD-10-CM

## 2014-03-04 LAB — BASIC METABOLIC PANEL
BUN: 9 mg/dL (ref 6–23)
CO2: 28 mEq/L (ref 19–32)
CREATININE: 0.8 mg/dL (ref 0.4–1.2)
Calcium: 8.6 mg/dL (ref 8.4–10.5)
Chloride: 98 mEq/L (ref 96–112)
GFR: 92.95 mL/min (ref >60.00)
Glucose, Bld: 94 mg/dL (ref 70–99)
Potassium: 3.7 mEq/L (ref 3.5–5.1)
Sodium: 139 mEq/L (ref 135–145)

## 2014-03-04 LAB — TSH: TSH: 1.29 u[IU]/mL (ref 0.35–4.50)

## 2014-03-04 MED ORDER — FUROSEMIDE 40 MG PO TABS: 40.0000 mg | ORAL_TABLET | Freq: Every day | ORAL | Status: DC

## 2014-03-04 NOTE — Progress Notes (Signed)
Pre visit review using our clinic review tool, if applicable. No additional management support is needed unless otherwise documented below in the visit note. 

## 2014-03-04 NOTE — Patient Instructions (Addendum)
Minimal Blood Pressure Goal= AVERAGE < 140/90;  Ideal is an AVERAGE < 135/85. This AVERAGE should be calculated from @ least 5-7 BP readings taken @ different times of day on different days of week. You should not respond to isolated BP readings , but rather the AVERAGE for that week .Please bring your  blood pressure cuff to office visits to verify that it is reliable.It  can also be checked against the blood pressure device at the pharmacy. Finger or wrist cuffs are not dependable; an arm cuff is.  Use an anti-inflammatory cream such as Aspercreme or Zostrix cream twice a day to the affected area as needed. In lieu of this warm moist compresses or  hot water bottle can be used. Do not apply ice . Tylenol is safe as long as the labeling directions are followed Tramadol can also be used for joint pain.

## 2014-03-04 NOTE — Progress Notes (Signed)
   Subjective:    Patient ID: Shawna Fuentes, female    DOB: 1933/06/16, 80 y.o.   MRN: [id number]  HPI   She is being monitored by [facility] home health services; blood pressures range is 140/70-162/88. Blood pressures are checked weekly by the home health nurse.  She has some headache which is described as slight  She has arthritic symptoms in her shoulders. She was previously on Sulindac but this was discontinued because of the warfarin therapy.  She also has sleep apnea and is on CPAP.  Her good hospital course at [facility] was reviewed. Surprisingly the lesion was not malignant.    Review of Systems   Chest pain, palpitations, tachycardia, exertional dyspnea, paroxysmal nocturnal dyspnea, or claudication  are absent.       Objective:   Physical Exam    Positive or pertinent findings include: She has complete dentures Obesity is present. She is in a wheelchair and on oxygen Breath sounds are decreased generally; she has rales at the bases, left greater than right. She has no increased work of breathing at rest Heart sounds are markedly distant, heard best in the epigastric area. Abdomen is protuberant. Bowel sounds are present; she has no organomegaly or masses She has 1/2+/1+ edema; pedal pulses are decreased but equal.  General appearance :adequately nourished; in no distress. Eyes: No conjunctival inflammation or scleral icterus is present. Oral exam: Dental hygiene is good. Lips and gums are healthy appearing.There is no oropharyngeal erythema or exudate noted.  Heart:  Normal rate and regular rhythm. S1 and S2 normal without gallop, murmur, click, rub or other extra sounds Skin:Warm & dry.  Intact without suspicious lesions or rashes ; no jaundice or tenting Lymphatic: No lymphadenopathy is noted about the head, neck, axilla           Assessment & Plan:  #1 hypertension, goals discussed. BMET. Furosemide will be increased to 40 mg daily.  #2 status post  resection of an early large goiter. Thyroid function monitor will be assumed Plan: See orders and recommendations

## 2014-03-08 ENCOUNTER — Ambulatory Visit (INDEPENDENT_AMBULATORY_CARE_PROVIDER_SITE_OTHER): Payer: Medicare HMO | Admitting: *Deleted

## 2014-03-08 LAB — POCT INR: INR: 2.2

## 2014-03-09 ENCOUNTER — Other Ambulatory Visit: Payer: Self-pay | Admitting: Internal Medicine

## 2014-03-12 ENCOUNTER — Telehealth: Payer: Self-pay | Admitting: Internal Medicine

## 2014-03-12 ENCOUNTER — Ambulatory Visit
Admission: RE | Admit: 2014-03-12 | Discharge: 2014-03-12 | Disposition: A | Discharge status: Home / Self Care | Payer: Commercial Managed Care - HMO | Source: Ambulatory Visit | Attending: Internal Medicine | Admitting: Internal Medicine

## 2014-03-12 DIAGNOSIS — Z13820 Encounter for screening for osteoporosis: Secondary | ICD-10-CM

## 2014-03-12 DIAGNOSIS — E2839 Other primary ovarian failure: Secondary | ICD-10-CM

## 2014-03-12 NOTE — Telephone Encounter (Signed)
Called son and he indicated that the bed did not go up and down. He stated that they have been to the [facility] building and they had a bed that would accommodate pt needs.   New order has been entered. Per MD.

## 2014-03-12 NOTE — Telephone Encounter (Signed)
Pt and her Son, Shawna Fuentes came into the office this morning and he stated pt did not go for her bone density scan this morning due to mobility issues.  He said pt cannot stand.  He also stated he needs to be called in regards to updating her medication list.  Please advise.

## 2014-03-12 NOTE — Telephone Encounter (Signed)
Also spoke to son regarding medications. He is faxing a list over to [place]. Will update once received.

## 2014-03-14 ENCOUNTER — Ambulatory Visit (INDEPENDENT_AMBULATORY_CARE_PROVIDER_SITE_OTHER): Payer: Medicare HMO | Admitting: *Deleted

## 2014-03-14 ENCOUNTER — Telehealth: Payer: Self-pay | Admitting: *Deleted

## 2014-03-14 LAB — POCT INR: INR: 1.8

## 2014-03-14 NOTE — Telephone Encounter (Signed)
Left msg on triage stating pt had PT check today results 1.80...[name]

## 2014-03-15 ENCOUNTER — Telehealth: Payer: Self-pay | Admitting: *Deleted

## 2014-03-15 DIAGNOSIS — Z0279 Encounter for issue of other medical certificate: Secondary | ICD-10-CM

## 2014-03-15 NOTE — Telephone Encounter (Signed)
[facility] Triage Call Report Triage Record Num: [id number] Operator: Feliberto Harts Patient Name: Shawna Fuentes Call Date & Time: 03/09/2014 2:57:02PM Patient Phone: [phone] PCP: Patient Gender: Female PCP Fax : Patient DOB: 09/22/1932 Practice Name: [facility] Reason for Call: Caller: [place]/Other; PCP: Gwendolyn Grant (Adults only); CB#: [phone]; Call regarding Medication Issue; Medication(s): re levothyroxine and calcitriol; Also Warfarin. Needs refills. Warfarin 4 mg tab #4, No refills. called to [facility] [employer]. Levothyroxin and Calcetriol was prescribed when left hospital from having thyroid removed and not on EMR drug list. Dr Sherren Mocha called concerning refills. Orders received:Levothyroxin 150 mcg. Take one tab PO QD. #30 and Calcetriol 0.25mg  Take one tab PO BID #30 No refills. Make appt with Dr Asa Lente next week. Care advice given and voices understanding. Triaged per Medication Question. Provider and pharmacist contacted. Note also sent to office with request for needed refills. Protocol(s) Used: Medication Questions - Adult Recommended Outcome per Protocol: Call Dispensing Pharmacy or Provider Immediately Reason for Outcome: Unable to obtain prescribed medication related to available resources AND situation poses immediate clinical risk Care Advice: ~ 09/

## 2014-03-21 ENCOUNTER — Ambulatory Visit (INDEPENDENT_AMBULATORY_CARE_PROVIDER_SITE_OTHER): Payer: Medicare HMO | Admitting: *Deleted

## 2014-03-21 LAB — POCT INR: INR: 2.2

## 2014-03-28 ENCOUNTER — Telehealth: Payer: Self-pay | Admitting: *Deleted

## 2014-03-28 NOTE — Telephone Encounter (Signed)
Left msg on triage requesting verbal ok to extend OT for another 2 weeks for 1 x a week to help strengthen/train...[name]

## 2014-03-30 NOTE — Telephone Encounter (Signed)
Verbal ok?

## 2014-04-01 ENCOUNTER — Institutional Professional Consult (permissible substitution): Payer: Medicare HMO | Admitting: Internal Medicine

## 2014-04-01 NOTE — Telephone Encounter (Signed)
Called Shawna Fuentes no answer LMOM with md response...Shawna Fuentes

## 2014-04-02 ENCOUNTER — Telehealth: Payer: Self-pay

## 2014-04-02 NOTE — Telephone Encounter (Signed)
Shawna Fuentes incontinence paperwork has been received and placed in blue folder to be filled out and then signed by MD.   [place] of that .

## 2014-04-03 DIAGNOSIS — Z0279 Encounter for issue of other medical certificate: Secondary | ICD-10-CM

## 2014-04-04 ENCOUNTER — Telehealth: Payer: Self-pay

## 2014-04-04 ENCOUNTER — Telehealth: Payer: Self-pay | Admitting: *Deleted

## 2014-04-04 NOTE — Telephone Encounter (Signed)
Request came in for a Refill request for Calcium Carb 600mg  Tab. Is this okay to fill?

## 2014-04-04 NOTE — Telephone Encounter (Signed)
Left msg on triage receive order back on incontinence supplies also needing ov notes. Pls fax to [phone].Gust Rung  Faxed last 2 ov notes...[name]

## 2014-04-05 ENCOUNTER — Encounter: Payer: Self-pay | Admitting: Internal Medicine

## 2014-04-07 MED ORDER — CALCIUM CARBONATE 600 MG PO TABS: 600.0000 mg | ORAL_TABLET | Freq: Two times a day (BID) | ORAL | Status: DC

## 2014-04-07 NOTE — Telephone Encounter (Signed)
Yes - e-rx for same done

## 2014-04-09 ENCOUNTER — Telehealth: Payer: Self-pay

## 2014-04-09 ENCOUNTER — Encounter: Payer: Self-pay | Admitting: Internal Medicine

## 2014-04-09 ENCOUNTER — Ambulatory Visit (INDEPENDENT_AMBULATORY_CARE_PROVIDER_SITE_OTHER): Payer: Medicare HMO | Admitting: Internal Medicine

## 2014-04-09 ENCOUNTER — Other Ambulatory Visit (INDEPENDENT_AMBULATORY_CARE_PROVIDER_SITE_OTHER): Payer: Commercial Managed Care - HMO

## 2014-04-09 VITALS — BP 140/70 | HR 77 | Temp 98.1°F | Ht 62.0 in

## 2014-04-09 DIAGNOSIS — E079 Disorder of thyroid, unspecified: Secondary | ICD-10-CM

## 2014-04-09 DIAGNOSIS — I82403 Acute embolism and thrombosis of unspecified deep veins of lower extremity, bilateral: Secondary | ICD-10-CM

## 2014-04-09 DIAGNOSIS — E785 Hyperlipidemia, unspecified: Secondary | ICD-10-CM

## 2014-04-09 DIAGNOSIS — Z23 Encounter for immunization: Secondary | ICD-10-CM

## 2014-04-09 DIAGNOSIS — I10 Essential (primary) hypertension: Secondary | ICD-10-CM

## 2014-04-09 DIAGNOSIS — J9612 Chronic respiratory failure with hypercapnia: Secondary | ICD-10-CM

## 2014-04-09 LAB — CBC WITH DIFFERENTIAL/PLATELET
BASOS PCT: 0.7 % (ref 0.0–3.0)
Basophils Absolute: 0 10*3/uL (ref 0.0–0.1)
EOS PCT: 3.7 % (ref 0.0–5.0)
Eosinophils Absolute: 0.2 10*3/uL (ref 0.0–0.7)
HCT: 31.2 % — ABNORMAL LOW (ref 36.0–46.0)
Hemoglobin: 10 g/dL — ABNORMAL LOW (ref 12.0–15.0)
Lymphocytes Relative: 36.9 % (ref 12.0–46.0)
Lymphs Abs: 2.3 10*3/uL (ref 0.7–4.0)
MCHC: 32.1 g/dL (ref 30.0–36.0)
MCV: 82.5 fl (ref 78.0–100.0)
MONO ABS: 0.4 10*3/uL (ref 0.1–1.0)
MONOS PCT: 6.1 % (ref 3.0–12.0)
NEUTROS PCT: 52.6 % (ref 43.0–77.0)
Neutro Abs: 3.3 10*3/uL (ref 1.4–7.7)
Platelets: 269 10*3/uL (ref 150.0–400.0)
RBC: 3.78 Mil/uL — AB (ref 3.87–5.11)
RDW: 15.5 % (ref 11.5–15.5)
WBC: 6.3 10*3/uL (ref 4.0–10.5)

## 2014-04-09 MED ORDER — METOPROLOL TARTRATE 25 MG PO TABS: 12.5000 mg | ORAL_TABLET | Freq: Two times a day (BID) | ORAL | Status: DC

## 2014-04-09 MED ORDER — CALCITRIOL 0.25 MCG PO CAPS: 0.2500 ug | ORAL_CAPSULE | Freq: Two times a day (BID) | ORAL | Status: DC

## 2014-04-09 MED ORDER — ASPIRIN 81 MG PO TABS: 81.0000 mg | ORAL_TABLET | Freq: Every day | ORAL | Status: AC

## 2014-04-09 MED ORDER — VALSARTAN 160 MG PO TABS: 160.0000 mg | ORAL_TABLET | Freq: Every day | ORAL | Status: DC

## 2014-04-09 MED ORDER — ATORVASTATIN CALCIUM 10 MG PO TABS: 10.0000 mg | ORAL_TABLET | Freq: Every evening | ORAL | Status: DC

## 2014-04-09 MED ORDER — VITAMIN D (ERGOCALCIFEROL) 1.25 MG (50000 UNIT) PO CAPS: 50000.0000 [IU] | ORAL_CAPSULE | ORAL | Status: DC

## 2014-04-09 MED ORDER — LEVOTHYROXINE SODIUM 150 MCG PO TABS: 150.0000 ug | ORAL_TABLET | Freq: Every day | ORAL | Status: DC

## 2014-04-09 MED ORDER — FUROSEMIDE 40 MG PO TABS: 40.0000 mg | ORAL_TABLET | Freq: Every day | ORAL | Status: DC

## 2014-04-09 MED ORDER — SULINDAC 200 MG PO TABS: 200.0000 mg | ORAL_TABLET | Freq: Two times a day (BID) | ORAL | Status: DC

## 2014-04-09 NOTE — Progress Notes (Signed)
Pre visit review using our clinic review tool, if applicable. No additional management support is needed unless otherwise documented below in the visit note. 

## 2014-04-09 NOTE — Assessment & Plan Note (Signed)
On statin Check annually, titrate as needed 

## 2014-04-09 NOTE — Telephone Encounter (Signed)
Shawna Fuentes called to re certify the orders for Occupational Therapy.   MD was near and gave verbal okay to continue OT.

## 2014-04-09 NOTE — Assessment & Plan Note (Signed)
BP Readings from Last 3 Encounters:  04/09/14 140/70  03/04/14 140/70  12/21/13 112/48   Reports generally controlled when compliant with medications as prescribed The current medical regimen is reviewed and refilled;  continue present plan and medications.

## 2014-04-09 NOTE — Patient Instructions (Addendum)
It was good to see you today.  Your annual flu shot was given and/or updated today.  We have reviewed your prior records including labs and tests today  Test(s) ordered today. Your results will be released to [place] (or called to you) after review, usually within 72hours after test completion. If any changes need to be made, you will be notified at that same time.  Medications reviewed and updated and reviewed Stop warfarin Resume sulindac twice daily for arthritis Resume baby aspirin 81 mg daily Be sure you're taking Diovan (valsartan) once daily for blood pressure in addition to other medications as reviewed  Refill on ALL medication(s) as discussed today.  Please schedule followup in 3-4 months, call sooner if problems.  Please follow up with Dr Annamaria Boots as discussed for your CPAP

## 2014-04-09 NOTE — Assessment & Plan Note (Addendum)
Massive thyroid mass incidentally noted on CT scan 12/2013  Seen locally by Dr Melida Quitter, ENT who recommended transfer to [place] thyroidectomy requiring sternotomy @ [facility] 12/28/13 - pathology showed nodular hyperplasia (benign path) Check TSH and adjust replacement as needed Lab Results  Component Value Date   TSH 1.29 03/04/2014

## 2014-04-09 NOTE — Assessment & Plan Note (Signed)
BUE R>L 12/2013 in setting of massive thyroid goiter and surgical issues On anticoag >24mo No prior DVT and no hypercoag disorder DC warfarn Ok to resume NSAIDs for OA and ASA 81

## 2014-04-09 NOTE — Progress Notes (Addendum)
Subjective:    Patient ID: Shawna Fuentes, female    DOB: 1933/05/15, 80 y.o.   MRN: [id number]  HPI  Patient is here for follow up  Reviewed chronic medical issues and interval medical events:  Massive goiter. Status post total thyroidectomy June 2015 at [place] [facility], no [facility] sign and home physical therapy. No longer following with endocrinology at tertiary center, ongoing thyroid replacement to be monitored here  Bilateral upper extremity DVT. Diagnosed in setting of massive goiter. On anticoagulation for 3 months with resolution of upper extremity swelling -no prior history of DVT. Question discontinuation of anticoagulation to resume anti-inflammatory  Osteoarthritis. Has been using tramadol as needed since off anti-inflammatory. Would prefer to resume twice-daily anti-inflammatories prior to surgery there is no joint swelling. No injury or history of GI bleed  Deconditioning. Wheelchair-bound since 2013, skilled nursing facility reviewed with ongoing home therapy. Short walking with assistance of rolling walker reviewed. Hopes to return home  Past Medical History  Diagnosis Date  . Arthritis   . Hyperlipidemia   . Hypertension   . History of kidney stones   . Hypothyroidism (acquired) 12/2013    s/p thyroidectomy 12/21/13 at [facility]  . Thyroid goiter     s/p thyroidectomy - pathology demonstrated nodular hyperplasia  . DVT (deep venous thrombosis) 12/2013    bilateral upper extremity DVT found at time of thyroidectomy    Family History  Problem Relation Age of Onset  . Osteoarthritis Mother   . Hypertension Mother   . Hypertension Father   . Diabetes Son   . Diabetes Son   . Cancer Father     unknown type   History  Substance Use Topics  . Smoking status: Never Smoker   . Smokeless tobacco: Not on file     Comment: lives w/ dtr dorthea, 2nd dtr and 3 sons in town  . Alcohol Use: No    Review of Systems  Constitutional: Positive for  fatigue. Negative for fever and unexpected weight change.  HENT: Negative for trouble swallowing.   Respiratory: Negative for cough, shortness of breath and wheezing.   Cardiovascular: Negative for chest pain, palpitations and leg swelling.  Gastrointestinal: Negative for nausea, abdominal pain and diarrhea.  Genitourinary: Positive for urgency, frequency and enuresis. Negative for dysuria and decreased urine volume.  Musculoskeletal: Positive for arthralgias and gait problem. Negative for joint swelling, myalgias and neck pain.  Neurological: Positive for weakness (slowly improving, but in WC since 2013 from deconditioning). Negative for dizziness, light-headedness and headaches.  Psychiatric/Behavioral: Negative for dysphoric mood. The patient is not nervous/anxious.   All other systems reviewed and are negative.      Objective:   Physical Exam  BP 140/70  Pulse 77  Temp(Src) 98.1 F (36.7 C) (Oral)  Ht 5\' 2"  (1.575 m)  SpO2 98% Wt Readings from Last 3 Encounters:  09/22/13 272 lb (123.378 kg)  09/22/13 272 lb (123.378 kg)  12/29/12 281 lb 3.2 oz (127.551 kg)   Constitutional: She is MO, appears well-developed and well-nourished. No distress. Son at side Neck: Thick. Well  Healed anterior scar from thyroidectomy/sternotomy. Normal range of motion. Neck supple. No JVD present. No thyroid present.  Cardiovascular: Distant -Normal rate, regular rhythm and normal heart sounds.  No murmur heard. No BLE edema. Pulmonary/Chest: Effort normal and breath sounds diminished at bases. No respiratory distress. She has no wheezes.  Psychiatric: She has a normal mood and affect. Her behavior is normal. Judgment and  thought content normal.   Lab Results  Component Value Date   WBC 8.4 12/20/2013   HGB 10.6* 12/20/2013   HCT 34.0* 12/20/2013   PLT 181 12/20/2013   GLUCOSE 94 03/04/2014   CHOL 140 12/29/2012   TRIG 61.0 12/29/2012   HDL 63.80 12/29/2012   LDLCALC 64 12/29/2012   ALT 6 12/20/2013    AST 13 12/20/2013   NA 139 03/04/2014   K 3.7 03/04/2014   CL 98 03/04/2014   CREATININE 0.8 03/04/2014   BUN 9 03/04/2014   CO2 28 03/04/2014   TSH 1.29 03/04/2014   INR 2.2 03/21/2014   HGBA1C 5.7* 09/23/2013    No results found.     Assessment & Plan:   Problem List Items Addressed This Visit   Chronic respiratory failure with hypercapnia   Deep vein thrombosis     BUE R>L 12/2013 in setting of massive thyroid goiter and surgical issues On anticoag >44mo No prior DVT and no hypercoag disorder DC warfarn Ok to resume NSAIDs for OA and ASA 81    Relevant Medications      aspirin 81 MG tablet      metoprolol tartrate (LOPRESSOR) tablet      atorvastatin (LIPITOR) tablet      furosemide (LASIX) tablet      valsartan (DIOVAN) tablet   Other Relevant Orders      CBC with Differential   Hyperlipidemia     On statin Check annually, titrate as needed    Relevant Medications      aspirin 81 MG tablet      metoprolol tartrate (LOPRESSOR) tablet      atorvastatin (LIPITOR) tablet      furosemide (LASIX) tablet      valsartan (DIOVAN) tablet   Other Relevant Orders      Lipid panel   Hypertension      BP Readings from Last 3 Encounters:  04/09/14 140/70  03/04/14 140/70  12/21/13 112/48   Reports generally controlled when compliant with medications as prescribed The current medical regimen is reviewed and refilled;  continue present plan and medications.    Relevant Medications      aspirin 81 MG tablet      metoprolol tartrate (LOPRESSOR) tablet      atorvastatin (LIPITOR) tablet      furosemide (LASIX) tablet      valsartan (DIOVAN) tablet   Other Relevant Orders      Basic metabolic panel      Lipid panel   Hypocalcemia   Relevant Orders      Basic metabolic panel   Post-surgical hypothyroidism - Primary      Massive thyroid mass incidentally noted on CT scan 12/2013  Seen locally by Dr Melida Quitter, ENT who recommended transfer to [place] thyroidectomy  requiring sternotomy @ [facility] 12/28/13 - pathology showed nodular hyperplasia (benign path) Check TSH and adjust replacement as needed Lab Results  Component Value Date   TSH 1.29 03/04/2014       Relevant Medications      metoprolol tartrate (LOPRESSOR) tablet      levothyroxine (SYNTHROID, LEVOTHROID) tablet    Other Visit Diagnoses   Need for prophylactic vaccination and inoculation against influenza        Relevant Orders       Flu vaccine HIGH DOSE PF (Fluzone Tri High dose) (Completed)      Urinary incontinence - ok for supplies to help manage same   Time spent with pt/family today  40 minutes, greater than 50% time spent counseling patient on thyroid issues, DVT BUE 12/2013, OA, HTN and medication review. Also review of prior records

## 2014-04-10 LAB — BASIC METABOLIC PANEL
BUN: 11 mg/dL (ref 6–23)
CO2: 28 mEq/L (ref 19–32)
Calcium: 8.5 mg/dL (ref 8.4–10.5)
Chloride: 97 mEq/L (ref 96–112)
Creatinine, Ser: 0.9 mg/dL (ref 0.4–1.2)
GFR: 81.8 mL/min (ref >60.00)
Glucose, Bld: 68 mg/dL — ABNORMAL LOW (ref 70–99)
Potassium: 3.6 mEq/L (ref 3.5–5.1)
SODIUM: 139 meq/L (ref 135–145)

## 2014-04-10 LAB — LIPID PANEL
CHOL/HDL RATIO: 3
CHOLESTEROL: 162 mg/dL (ref 0–200)
HDL: 63.7 mg/dL (ref >39.00)
LDL CALC: 85 mg/dL (ref 0–99)
NonHDL: 98.3
TRIGLYCERIDES: 67 mg/dL (ref 0.0–149.0)
VLDL: 13.4 mg/dL (ref 0.0–40.0)

## 2014-04-10 LAB — TSH: TSH: 0.89 u[IU]/mL (ref 0.35–4.50)

## 2014-04-15 DIAGNOSIS — L89312 Pressure ulcer of right buttock, stage 2: Secondary | ICD-10-CM

## 2014-04-15 DIAGNOSIS — Z466 Encounter for fitting and adjustment of urinary device: Secondary | ICD-10-CM

## 2014-04-15 DIAGNOSIS — Z4889 Encounter for other specified surgical aftercare: Secondary | ICD-10-CM

## 2014-04-15 DIAGNOSIS — L89322 Pressure ulcer of left buttock, stage 2: Secondary | ICD-10-CM

## 2014-04-17 ENCOUNTER — Telehealth: Payer: Self-pay | Admitting: *Deleted

## 2014-04-17 NOTE — Telephone Encounter (Signed)
Addendum done as requested

## 2014-04-17 NOTE — Telephone Encounter (Signed)
Shawna Fuentes state pt came in for ov 04/09/14, but nothing on the ov was mention about incontinence. They are needing documentation on incontinence so pt can received her supplies. Wanting to know can md can do a addendum to 04/09/14 and add documentation. Pls advise,,,/lmb

## 2014-04-19 NOTE — Telephone Encounter (Signed)
Called Shawna Fuentes had to leave msg for her to RTC...[name]

## 2014-04-22 NOTE — Telephone Encounter (Signed)
Melissa return call back faxing addendum from 04/09/14 to her @ [phone]...[name]

## 2014-04-27 ENCOUNTER — Other Ambulatory Visit: Payer: Self-pay | Admitting: Internal Medicine

## 2014-05-06 DIAGNOSIS — Z0279 Encounter for issue of other medical certificate: Secondary | ICD-10-CM

## 2014-05-09 ENCOUNTER — Telehealth: Payer: Self-pay | Admitting: Internal Medicine

## 2014-05-09 MED ORDER — NYSTATIN 100000 UNIT/GM EX POWD: 1.0000 g | Freq: Three times a day (TID) | CUTANEOUS | Status: DC

## 2014-05-09 NOTE — Telephone Encounter (Signed)
Jenny Reichmann calling from [facility] and wanted Dr Asa Lente to know pt has developed or at least what  looks like yeast infection under breast and vaginal area, strong odor. Requesting Diflucan to address this. Pls advise.  [facility]

## 2014-05-09 NOTE — Telephone Encounter (Signed)
Nystatin powder (not Diflucan) - erx done, apply to affected area as needed

## 2014-06-06 ENCOUNTER — Telehealth: Payer: Self-pay | Admitting: Internal Medicine

## 2014-06-06 NOTE — Telephone Encounter (Signed)
Would like to take cathter out and would like orders for urin sample b/c of strong odor.

## 2014-06-06 NOTE — Telephone Encounter (Signed)
Spoke with Jenny Reichmann at [facility] and gave PCP verbal okay to d/c cath and to perform UA for UTI sx.  Jenny Reichmann agreed to send results.

## 2014-06-06 NOTE — Telephone Encounter (Signed)
Verbal ok for DC cath and for UA Please send results for review and will tx UTI if Ua abn

## 2014-06-12 DIAGNOSIS — R339 Retention of urine, unspecified: Secondary | ICD-10-CM | POA: Diagnosis not present

## 2014-06-12 DIAGNOSIS — E662 Morbid (severe) obesity with alveolar hypoventilation: Secondary | ICD-10-CM | POA: Diagnosis not present

## 2014-06-12 DIAGNOSIS — Z993 Dependence on wheelchair: Secondary | ICD-10-CM | POA: Diagnosis not present

## 2014-06-12 DIAGNOSIS — I10 Essential (primary) hypertension: Secondary | ICD-10-CM | POA: Diagnosis not present

## 2014-06-17 ENCOUNTER — Other Ambulatory Visit: Payer: Self-pay

## 2014-06-17 MED ORDER — AMPICILLIN 500 MG PO CAPS: 500.0000 mg | ORAL_CAPSULE | Freq: Three times a day (TID) | ORAL | Status: DC

## 2014-06-20 ENCOUNTER — Other Ambulatory Visit: Payer: Self-pay | Admitting: Internal Medicine

## 2014-07-05 DIAGNOSIS — M6281 Muscle weakness (generalized): Secondary | ICD-10-CM | POA: Diagnosis not present

## 2014-07-06 DIAGNOSIS — M6281 Muscle weakness (generalized): Secondary | ICD-10-CM | POA: Diagnosis not present

## 2014-07-07 DIAGNOSIS — M6281 Muscle weakness (generalized): Secondary | ICD-10-CM | POA: Diagnosis not present

## 2014-07-08 DIAGNOSIS — E669 Obesity, unspecified: Secondary | ICD-10-CM | POA: Diagnosis not present

## 2014-07-08 DIAGNOSIS — E785 Hyperlipidemia, unspecified: Secondary | ICD-10-CM | POA: Diagnosis not present

## 2014-07-08 DIAGNOSIS — M6281 Muscle weakness (generalized): Secondary | ICD-10-CM | POA: Diagnosis not present

## 2014-07-08 DIAGNOSIS — I10 Essential (primary) hypertension: Secondary | ICD-10-CM | POA: Diagnosis not present

## 2014-07-09 ENCOUNTER — Ambulatory Visit (INDEPENDENT_AMBULATORY_CARE_PROVIDER_SITE_OTHER): Payer: Medicare HMO | Admitting: Internal Medicine

## 2014-07-09 ENCOUNTER — Ambulatory Visit (HOSPITAL_COMMUNITY)
Admission: RE | Admit: 2014-07-09 | Discharge: 2014-07-09 | Disposition: A | Discharge status: Home / Self Care | Payer: Commercial Managed Care - HMO | Source: Ambulatory Visit | Attending: Internal Medicine | Admitting: Internal Medicine

## 2014-07-09 ENCOUNTER — Ambulatory Visit (INDEPENDENT_AMBULATORY_CARE_PROVIDER_SITE_OTHER)
Admission: RE | Admit: 2014-07-09 | Discharge: 2014-07-09 | Disposition: A | Discharge status: Home / Self Care | Payer: Commercial Managed Care - HMO | Source: Ambulatory Visit | Attending: Internal Medicine | Admitting: Internal Medicine

## 2014-07-09 ENCOUNTER — Encounter: Payer: Self-pay | Admitting: Internal Medicine

## 2014-07-09 VITALS — BP 134/78 | HR 70 | Ht 62.0 in | Wt 237.0 lb

## 2014-07-09 DIAGNOSIS — E669 Obesity, unspecified: Secondary | ICD-10-CM | POA: Diagnosis not present

## 2014-07-09 DIAGNOSIS — Z13820 Encounter for screening for osteoporosis: Secondary | ICD-10-CM

## 2014-07-09 DIAGNOSIS — R0600 Dyspnea, unspecified: Secondary | ICD-10-CM

## 2014-07-09 DIAGNOSIS — J9612 Chronic respiratory failure with hypercapnia: Secondary | ICD-10-CM

## 2014-07-09 DIAGNOSIS — Z9981 Dependence on supplemental oxygen: Secondary | ICD-10-CM | POA: Insufficient documentation

## 2014-07-09 DIAGNOSIS — I82403 Acute embolism and thrombosis of unspecified deep veins of lower extremity, bilateral: Secondary | ICD-10-CM | POA: Diagnosis not present

## 2014-07-09 DIAGNOSIS — J9811 Atelectasis: Secondary | ICD-10-CM | POA: Diagnosis not present

## 2014-07-09 LAB — BLOOD GAS, ARTERIAL
ACID-BASE EXCESS: 4.1 mmol/L — AB (ref 0.0–2.0)
Bicarbonate: 29.6 mEq/L — ABNORMAL HIGH (ref 20.0–24.0)
Drawn by: 42253
O2 CONTENT: 2 L/min
O2 Saturation: 97.2 %
PCO2 ART: 52.8 mmHg — AB (ref 35.0–45.0)
PH ART: 7.368 (ref 7.350–7.450)
Patient temperature: 98.6
TCO2: 27.9 mmol/L (ref 0–100)
pO2, Arterial: 101 mmHg — ABNORMAL HIGH (ref 80.0–100.0)

## 2014-07-09 NOTE — Assessment & Plan Note (Signed)
Obesity, associated sedentary lifestyle and reported bilateral DVT place her at increased long-term risk for recurrence, being followed by her primary physician.

## 2014-07-09 NOTE — Progress Notes (Signed)
07/09/14 -81 yoF never smoker Referred by Dr Asa Lente for sleep medicine consultation. Current CPAP. Wears O2 2L [facility] during day and bled through CPAP( auto 4-14) at night. Epworth Score: 4     son here-she lives with him Hx hip fx 09/2013, goiter surgery complicated by CO2 retention 12/27/13 @ [facility], DVT bilateral UE(? PE). Now off warfarin. Becoming more ambulatory but mostly wheelchair bound for several years by obesity and osteoarthritis. Apparently goiter was very large, constricting airway and contributing to hypoxia and CO2 retention. Son reports little snoring and she denies daytime sleepiness, with no history of obstructive sleep apnea documented. CPAP is for chronic respiratory failure. She is hoping that she can come off of oxygen and CPAP. She denies past known lung disease.  Prior to Admission medications   Medication Sig Start Date End Date Taking? Authorizing Provider  aspirin 81 MG tablet Take 1 tablet (81 mg total) by mouth daily. 04/09/14  Yes Rowe Clack, MD  atorvastatin (LIPITOR) 10 MG tablet Take 1 tablet (10 mg total) by mouth every evening. 04/09/14  Yes Rowe Clack, MD  calcitRIOL (ROCALTROL) 0.25 MCG capsule Take 1 capsule (0.25 mcg total) by mouth 2 (two) times daily. 04/09/14  Yes Rowe Clack, MD  calcium carbonate (OS-CAL) 600 MG TABS tablet Take 1 tablet (600 mg total) by mouth 2 (two) times daily with a meal. 04/07/14  Yes Rowe Clack, MD  furosemide (LASIX) 40 MG tablet Take 1 tablet (40 mg total) by mouth daily. 04/09/14  Yes Rowe Clack, MD  HYDROcodone-acetaminophen (NORCO/VICODIN) 5-325 MG per tablet Take 1 tablet by mouth every 6 (six) hours as needed for moderate pain.   Yes Historical Provider, MD  levothyroxine (SYNTHROID, LEVOTHROID) 150 MCG tablet Take 1 tablet (150 mcg total) by mouth daily before breakfast. 04/09/14  Yes Rowe Clack, MD  metoprolol tartrate (LOPRESSOR) 25 MG tablet Take 0.5 tablets (12.5 mg total) by mouth 2  (two) times daily. 04/09/14  Yes Rowe Clack, MD  nystatin (MYCOSTATIN/NYSTOP) 100000 UNIT/GM POWD Apply to affected area 3 times a day 06/20/14  Yes Rowe Clack, MD  sulindac (CLINORIL) 200 MG tablet Take 1 tablet (200 mg total) by mouth 2 (two) times daily. 04/09/14  Yes Rowe Clack, MD  traMADol (ULTRAM) 50 MG tablet Take 50 mg by mouth every 8 (eight) hours as needed.   Yes Historical Provider, MD  Vitamin D, Ergocalciferol, (DRISDOL) 50000 UNITS CAPS capsule Take 1 capsule (50,000 Units total) by mouth every 7 (seven) days. On Thursday 04/09/14  Yes Rowe Clack, MD  ampicillin (PRINCIPEN) 500 MG capsule Take 1 capsule (500 mg total) by mouth 3 (three) times daily. Patient not taking: Reported on 07/09/2014 06/17/14   Rowe Clack, MD  ferrous sulfate 325 (65 FE) MG tablet Take 325 mg by mouth 3 (three) times daily with meals.    Historical Provider, MD  valsartan (DIOVAN) 160 MG tablet Take 1 tablet (160 mg total) by mouth daily. Patient not taking: Reported on 07/09/2014 04/09/14   Rowe Clack, MD   Past Medical History  Diagnosis Date  . Arthritis   . Hyperlipidemia   . Hypertension   . History of kidney stones   . Hypothyroidism (acquired) 12/2013    s/p thyroidectomy 12/21/13 at [facility]  . Thyroid goiter     s/p thyroidectomy - pathology demonstrated nodular hyperplasia  . DVT (deep venous thrombosis) 12/2013    bilateral upper extremity DVT found at  time of thyroidectomy    Past Surgical History  Procedure Laterality Date  . Hip surgery    . Femur im nail Right 09/22/2013    Procedure: INTRAMEDULLARY (IM) NAIL FEMORAL;  Surgeon: Wylene Simmer, MD;  Location: [facility];  Service: Orthopedics;  Laterality: Right;  . Thyroidectomy with sternotomy  12/21/13    thyroid mass found incidentally on CT scan 12/2013; transferred to [facility] for surgical removal of thyroid and goiter requiring sternotomy   Family History  Problem Relation Age of Onset  .  Osteoarthritis Mother   . Hypertension Mother   . Hypertension Father   . Diabetes Son   . Diabetes Son   . Cancer Father     unknown type  . Clotting disorder    . Rheumatologic disease     History   Social History  . Marital Status: Widowed    Spouse Name: N/A    Number of Children: N/A  . Years of Education: N/A   Occupational History  . retired    Social History Main Topics  . Smoking status: Never Smoker   . Smokeless tobacco: Not on file     Comment: lives w/ dtr dorthea, 2nd dtr and 3 sons in town  . Alcohol Use: No  . Drug Use: No  . Sexual Activity: Not on file   Other Topics Concern  . 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   Lives with daughter dorthea - has 3 sons and another daughter in town, supportive   ROS-see HPI Constitutional:   No-   weight loss, night sweats, fevers, chills, fatigue, lassitude. HEENT:   No-  headaches, difficulty swallowing, tooth/dental problems, sore throat,       No-  sneezing, itching, ear ache, nasal congestion, post nasal drip,  CV:  No-   chest pain, orthopnea, PND, swelling in lower extremities, anasarca,                                  dizziness, palpitations Resp: +shortness of breath with exertion or at rest.              + productive cough,  No non-productive cough,  No- coughing up of blood.              No-   change in color of mucus.  No- wheezing.   Skin: No-   rash or lesions. GI:  + heartburn, No- indigestion, abdominal pain, nausea, vomiting, diarrhea,                 change in bowel habits, loss of appetite GU: No-   dysuria, change in color of urine, no urgency or frequency.  No- flank pain. MS:  No-   joint pain or swelling.  No- decreased range of motion.  No- back pain. Neuro-     nothing unusual Psych:  No- change in mood or affect. No depression or anxiety.  No memory loss.  OBJ- Physical Exam  +  obesity, wheelchair General- Alert, Oriented, Affect-appropriate, Distress- none acute Skin- rash-none, lesions- none, excoriation- none Lymphadenopathy- none Head- atraumatic            Eyes- Gross vision intact, PERRLA, conjunctivae and secretions clear            Ears- Hearing, canals-normal  Nose- Clear, no-Septal dev, mucus, polyps, erosion, perforation             Throat- Mallampati III-IV , mucosa clear , drainage- none, tonsils- atrophic, + dentures Neck- flexible , trachea midline, no stridor , thyroid nl, carotid no bruit Chest - symmetrical excursion , unlabored           Heart/CV- RRR , no murmur , no gallop  , no rub, nl s1 s2                           - JVD- none , edema- none, stasis changes- none, varices- none           Lung- + distant especially on right, +too heavy to percuss dullness, wheeze- none, cough- none , dullness-none, rub- none           Chest wall-  Abd- tender-no, distended-no, bowel sounds-present, HSM- no Br/ Gen/ Rectal- Not done, not indicated Extrem- cyanosis- none, clubbing, none, atrophy- none, strength- nl, + heavy without definite edema Neuro- grossly intact to observation

## 2014-07-09 NOTE — Patient Instructions (Signed)
Order- CXR  Dx dyspnea, chronic respiratory failure with hypoxia             ABG on O2 2L             Schedule ONOX on room air, no CPAP  (DME [facility])  We will call these results and discuss what to do once we know.

## 2014-07-10 ENCOUNTER — Ambulatory Visit (INDEPENDENT_AMBULATORY_CARE_PROVIDER_SITE_OTHER): Payer: Commercial Managed Care - HMO | Admitting: Internal Medicine

## 2014-07-10 ENCOUNTER — Other Ambulatory Visit (INDEPENDENT_AMBULATORY_CARE_PROVIDER_SITE_OTHER): Payer: Commercial Managed Care - HMO

## 2014-07-10 ENCOUNTER — Encounter: Payer: Self-pay | Admitting: Internal Medicine

## 2014-07-10 VITALS — BP 160/80 | HR 86 | Temp 97.8°F | Resp 20 | Ht 62.0 in | Wt 253.0 lb

## 2014-07-10 DIAGNOSIS — M19049 Primary osteoarthritis, unspecified hand: Secondary | ICD-10-CM | POA: Insufficient documentation

## 2014-07-10 DIAGNOSIS — Z418 Encounter for other procedures for purposes other than remedying health state: Secondary | ICD-10-CM | POA: Diagnosis not present

## 2014-07-10 DIAGNOSIS — D509 Iron deficiency anemia, unspecified: Secondary | ICD-10-CM

## 2014-07-10 DIAGNOSIS — I10 Essential (primary) hypertension: Secondary | ICD-10-CM

## 2014-07-10 DIAGNOSIS — M1990 Unspecified osteoarthritis, unspecified site: Secondary | ICD-10-CM | POA: Diagnosis not present

## 2014-07-10 DIAGNOSIS — E890 Postprocedural hypothyroidism: Secondary | ICD-10-CM | POA: Diagnosis not present

## 2014-07-10 DIAGNOSIS — Z23 Encounter for immunization: Secondary | ICD-10-CM | POA: Diagnosis not present

## 2014-07-10 DIAGNOSIS — E785 Hyperlipidemia, unspecified: Secondary | ICD-10-CM | POA: Diagnosis not present

## 2014-07-10 DIAGNOSIS — M79643 Pain in unspecified hand: Secondary | ICD-10-CM

## 2014-07-10 DIAGNOSIS — Z299 Encounter for prophylactic measures, unspecified: Secondary | ICD-10-CM

## 2014-07-10 DIAGNOSIS — E669 Obesity, unspecified: Secondary | ICD-10-CM | POA: Diagnosis not present

## 2014-07-10 LAB — BASIC METABOLIC PANEL
BUN: 25 mg/dL — ABNORMAL HIGH (ref 6–23)
CHLORIDE: 106 meq/L (ref 96–112)
CO2: 29 mEq/L (ref 19–32)
Calcium: 9.3 mg/dL (ref 8.4–10.5)
Creatinine, Ser: 0.9 mg/dL (ref 0.4–1.2)
GFR: 79.6 mL/min (ref >60.00)
Glucose, Bld: 90 mg/dL (ref 70–99)
Potassium: 3.9 mEq/L (ref 3.5–5.1)
Sodium: 145 mEq/L (ref 135–145)

## 2014-07-10 LAB — CBC WITH DIFFERENTIAL/PLATELET
Basophils Absolute: 0 10*3/uL (ref 0.0–0.1)
Basophils Relative: 0.5 % (ref 0.0–3.0)
EOS ABS: 0.1 10*3/uL (ref 0.0–0.7)
Eosinophils Relative: 2.4 % (ref 0.0–5.0)
HCT: 34.1 % — ABNORMAL LOW (ref 36.0–46.0)
HEMOGLOBIN: 10.6 g/dL — AB (ref 12.0–15.0)
Lymphocytes Relative: 26.2 % (ref 12.0–46.0)
Lymphs Abs: 1.6 10*3/uL (ref 0.7–4.0)
MCHC: 31.1 g/dL (ref 30.0–36.0)
MCV: 81.8 fl (ref 78.0–100.0)
MONOS PCT: 4.3 % (ref 3.0–12.0)
Monocytes Absolute: 0.3 10*3/uL (ref 0.1–1.0)
Neutro Abs: 4 10*3/uL (ref 1.4–7.7)
Neutrophils Relative %: 66.6 % (ref 43.0–77.0)
Platelets: 188 10*3/uL (ref 150.0–400.0)
RBC: 4.17 Mil/uL (ref 3.87–5.11)
RDW: 18.5 % — AB (ref 11.5–15.5)
WBC: 6.1 10*3/uL (ref 4.0–10.5)

## 2014-07-10 LAB — RHEUMATOID FACTOR: Rhuematoid fact SerPl-aCnc: <10 IU/mL (ref <=14)

## 2014-07-10 LAB — TSH: TSH: 0.33 u[IU]/mL — ABNORMAL LOW (ref 0.35–4.50)

## 2014-07-10 LAB — VITAMIN D 25 HYDROXY (VIT D DEFICIENCY, FRACTURES): VITD: 42.96 ng/mL (ref 30.00–100.00)

## 2014-07-10 MED ORDER — FAMOTIDINE 10 MG PO TABS: 10.0000 mg | ORAL_TABLET | Freq: Two times a day (BID) | ORAL | Status: DC

## 2014-07-10 MED ORDER — VALSARTAN 160 MG PO TABS: 160.0000 mg | ORAL_TABLET | Freq: Every day | ORAL | Status: DC

## 2014-07-10 NOTE — Assessment & Plan Note (Signed)
On significant replacement following thyroidectomy June 2015 DEXA results pending, appears to be borderline osteoporosis Check vitamin D level, DC if normalized Continue her calcitriol with 600 mg calcium twice daily -if calcium normal

## 2014-07-10 NOTE — Assessment & Plan Note (Signed)
On statin Check annually, titrate as needed

## 2014-07-10 NOTE — Progress Notes (Signed)
Subjective:    Patient ID: Shawna Fuentes, female    DOB: 02-19-1933, 81 y.o.   MRN: [id number]  HPI  Patient is here for follow up  Reviewed chronic medical issues and interval medical events:  Massive goiter s/p total thyroidectomy June 2015 at [facility] -No longer following with endocrinology at tertiary center, ongoing thyroid replacement to be monitored here  skilled nursing facility rehabilitation with deconditioning with SNF summer-fall 2015. Wheelchair-bound since 2013, s/p home therapy through 05/2014. Short walking with assistance of rolling walker reviewed.   Bilateral upper extremity DVT 12/2013. Diagnosed in setting of massive goiter. On anticoagulation for 3 months with resolution of upper extremity swelling -then stopped as no prior history of DVT in order to resume anti-inflammatory  Osteoarthritis. Has been using tramadol or rare hydrocodone as needed with twice daily anti-inflammatory.  there is no joint swelling. No injury or history of GI bleed  hypertension - the patient reports ?compliance with medication(s) as prescribed (unsure about ARB), but none taken this AM as fasting. Denies adverse side effects.  Past Medical History  Diagnosis Date  . Arthritis   . Hyperlipidemia   . Hypertension   . History of kidney stones   . Hypothyroidism (acquired) 12/2013    s/p thy[facility] 12/21/13 at [facility]  . Thyroid goiter     s/p thyroidectomy - pathology demonstrated nodular hyperplasia  . DVT (deep venous thrombosis) 12/2013    bilateral upper extremity DVT found at time of thyroidectomy     Review of Systems  Constitutional: Negative for fever and unexpected weight change.  Respiratory: Negative for cough, chest tightness and shortness of breath.   Musculoskeletal: Positive for arthralgias (B hands across MCPs, esp AM - improved but not resolved with NSAIDs -?RA). Negative for myalgias.  Skin:       Hair thinning and loss x 6 mo - female pattern "at top"       Objective:    Physical Exam  BP 160/80 mmHg  Pulse 86  Temp(Src) 97.8 F (36.6 C) (Oral)  Resp 20  Ht 5\' 2"  (1.575 m)  Wt 253 lb (114.76 kg)  BMI 46.26 kg/m2  SpO2 77% Wt Readings from Last 3 Encounters:  07/10/14 253 lb (114.76 kg)  07/09/14 237 lb (107.502 kg)  09/22/13 272 lb (123.378 kg)   Constitutional: She is MO, appears well-developed and well-nourished. No distress. Son at side Neck: Thick. Well  Healed anterior scar from thyroidectomy/sternotomy. Normal range of motion. Neck supple. No JVD present. No thyroid present.  Cardiovascular: Distant -Normal rate, regular rhythm and normal heart sounds.  No murmur heard. No BLE edema. Pulmonary/Chest: Effort normal and breath sounds diminished at bases. No respiratory distress. She has no wheezes.  Musculoskeletal : Synovial thickening over second through fourth MCPs bilateral hands , no effusions or deformities  Psychiatric: She has a normal mood and affect. Her behavior is normal. Judgment and thought content normal.   Lab Results  Component Value Date   WBC 6.3 04/09/2014   HGB 10.0* 04/09/2014   HCT 31.2* 04/09/2014   PLT 269.0 04/09/2014   GLUCOSE 68* 04/09/2014   CHOL 162 04/09/2014   TRIG 67.0 04/09/2014   HDL 63.70 04/09/2014   LDLCALC 85 04/09/2014   ALT 6 12/20/2013   AST 13 12/20/2013   NA 139 04/09/2014   K 3.6 04/09/2014   CL 97 04/09/2014   CREATININE 0.9 04/09/2014   BUN 11 04/09/2014   CO2 28 04/09/2014   TSH 0.89 04/09/2014  INR 2.2 03/21/2014   HGBA1C 5.7* 09/23/2013    No results found.     Assessment & Plan:   Problem List Items Addressed This Visit    Anemia, iron deficiency    No longer on oral iron per med rec Check CBC, consider resuming if needed if ferritin low    Relevant Orders      CBC with Differential   Arthritis pain of hand - Primary    ? Prior diagnosis of rheumatoid arthritis per history today Morning symptomatic MCP stiffness, moderately improved with anti-inflammatory but not  resolved Check rheumatoid factor and CCP today. Refer to rheumatology if abnormal for further evaluation and treatment of potential autoimmune disease Otherwise continue anti-inflammatory twice daily as ongoing, reviewed again GI potential risk versus benefit and okay for tramadol/hydrocodone rarely as needed when symptoms severe    Relevant Orders      Rheumatoid factor      Cyclic citrul peptide antibody, IgG   Hyperlipidemia    On statin Check annually, titrate as needed    Relevant Medications      valsartan (DIOVAN) tablet   Hypertension    BP Readings from Last 3 Encounters:  07/10/14 160/80  07/09/14 134/78  04/09/14 140/70   Uncontrolled today, presume noncompliance with ARB on med rec Represcribed Diovan today continue with low-dose beta blocker Patient/family to monitor home and call if consistently greater than 140/85    Relevant Medications      valsartan (DIOVAN) tablet   Other Relevant Orders      Basic metabolic panel   Hypocalcemia    On significant replacement following thyroidectomy June 2015 DEXA results pending, appears to be borderline osteoporosis Check vitamin D level, DC if normalized Continue her calcitriol with 600 mg calcium twice daily -if calcium normal    Relevant Orders      Vit D  25 hydroxy (rtn osteoporosis monitoring)      Basic metabolic panel   Post-surgical hypothyroidism    Massive thyroid mass incidentally noted on CT scan 12/2013  Seen locally by Dr Melida Quitter, ENT who [facility] 12/28/13 - pathology showed nodular hyperplasia (benign path) Check TSH and adjust replacement as needed Lab Results  Component Value Date   TSH 0.89 04/09/2014      Relevant Orders      TSH

## 2014-07-10 NOTE — Patient Instructions (Addendum)
It was good to see you today.  We have reviewed your prior records including labs and tests today  Prevnar 13, pneumonia vaccine #2, updated today  Test(s) ordered today. Your results will be released to [place] (or called to you) after review, usually within 72hours after test completion. If any changes need to be made, you will be notified at that same time.  Medications reviewed and updated Resume diet on once daily for blood pressure Reduce calcium to 600 mg (one tablet) twice daily We'll plan to stop vitamin D if labs show this level is normal All other medications as listed  Your prescription(s)/refills have been submitted to your pharmacy. Please take as directed and contact our office if you believe you are having problem(s) with the medication(s).  Monitor blood pressure at home and call if greater than 140/85 for medication adjustment as needed  Handicap application signed today -please take to [facility]  Please schedule followup in 4 months, call sooner if problems.

## 2014-07-10 NOTE — Assessment & Plan Note (Signed)
No longer on oral iron per med rec Check CBC, consider resuming if needed if ferritin low

## 2014-07-10 NOTE — Progress Notes (Signed)
Pre visit review using our clinic review tool, if applicable. No additional management support is needed unless otherwise documented below in the visit note. 

## 2014-07-10 NOTE — Assessment & Plan Note (Signed)
BP Readings from Last 3 Encounters:  07/10/14 160/80  07/09/14 134/78  04/09/14 140/70   Uncontrolled today, presume noncompliance with ARB on med rec Represcribed Diovan today continue with low-dose beta blocker Patient/family to monitor home and call if consistently greater than 140/85

## 2014-07-10 NOTE — Assessment & Plan Note (Signed)
?   Prior diagnosis of rheumatoid arthritis per history today Morning symptomatic MCP stiffness, moderately improved with anti-inflammatory but not resolved Check rheumatoid factor and CCP today. Refer to rheumatology if abnormal for further evaluation and treatment of potential autoimmune disease Otherwise continue anti-inflammatory twice daily as ongoing, reviewed again GI potential risk versus benefit and okay for tramadol/hydrocodone rarely as needed when symptoms severe

## 2014-07-10 NOTE — Assessment & Plan Note (Signed)
Massive thyroid mass incidentally noted on CT scan 12/2013  Seen locally by Dr Melida Quitter, ENT who recommended transfer to [place] thyroidectomy requiring sternotomy @ [facility] 12/28/13 - pathology showed nodular hyperplasia (benign path) Check TSH and adjust replacement as needed Lab Results  Component Value Date   TSH 0.89 04/09/2014

## 2014-07-11 DIAGNOSIS — E669 Obesity, unspecified: Secondary | ICD-10-CM | POA: Diagnosis not present

## 2014-07-11 DIAGNOSIS — G8929 Other chronic pain: Secondary | ICD-10-CM | POA: Diagnosis not present

## 2014-07-11 DIAGNOSIS — J9610 Chronic respiratory failure, unspecified whether with hypoxia or hypercapnia: Secondary | ICD-10-CM | POA: Diagnosis not present

## 2014-07-11 DIAGNOSIS — M5416 Radiculopathy, lumbar region: Secondary | ICD-10-CM | POA: Diagnosis not present

## 2014-07-11 DIAGNOSIS — M6281 Muscle weakness (generalized): Secondary | ICD-10-CM | POA: Diagnosis not present

## 2014-07-12 DIAGNOSIS — E669 Obesity, unspecified: Secondary | ICD-10-CM | POA: Diagnosis not present

## 2014-07-12 LAB — CYCLIC CITRUL PEPTIDE ANTIBODY, IGG: Cyclic Citrullin Peptide Ab: <2 U/mL (ref 0.0–5.0)

## 2014-07-13 DIAGNOSIS — E669 Obesity, unspecified: Secondary | ICD-10-CM | POA: Diagnosis not present

## 2014-07-14 DIAGNOSIS — E669 Obesity, unspecified: Secondary | ICD-10-CM | POA: Diagnosis not present

## 2014-07-15 ENCOUNTER — Encounter: Payer: Self-pay | Admitting: Internal Medicine

## 2014-07-15 DIAGNOSIS — M810 Age-related osteoporosis without current pathological fracture: Secondary | ICD-10-CM

## 2014-07-15 DIAGNOSIS — E669 Obesity, unspecified: Secondary | ICD-10-CM | POA: Diagnosis not present

## 2014-07-15 HISTORY — DX: Age-related osteoporosis without current pathological fracture: M81.0

## 2014-07-16 DIAGNOSIS — E669 Obesity, unspecified: Secondary | ICD-10-CM | POA: Diagnosis not present

## 2014-07-16 DIAGNOSIS — J449 Chronic obstructive pulmonary disease, unspecified: Secondary | ICD-10-CM | POA: Diagnosis not present

## 2014-07-16 DIAGNOSIS — J9610 Chronic respiratory failure, unspecified whether with hypoxia or hypercapnia: Secondary | ICD-10-CM | POA: Diagnosis not present

## 2014-07-17 ENCOUNTER — Telehealth: Payer: Self-pay | Admitting: Internal Medicine

## 2014-07-17 DIAGNOSIS — Z993 Dependence on wheelchair: Secondary | ICD-10-CM | POA: Diagnosis not present

## 2014-07-17 DIAGNOSIS — Z87440 Personal history of urinary (tract) infections: Secondary | ICD-10-CM | POA: Diagnosis not present

## 2014-07-17 DIAGNOSIS — E662 Morbid (severe) obesity with alveolar hypoventilation: Secondary | ICD-10-CM | POA: Diagnosis not present

## 2014-07-17 DIAGNOSIS — I10 Essential (primary) hypertension: Secondary | ICD-10-CM | POA: Diagnosis not present

## 2014-07-17 DIAGNOSIS — R339 Retention of urine, unspecified: Secondary | ICD-10-CM | POA: Diagnosis not present

## 2014-07-17 DIAGNOSIS — E669 Obesity, unspecified: Secondary | ICD-10-CM | POA: Diagnosis not present

## 2014-07-17 NOTE — Telephone Encounter (Signed)
Patients feet and legs have begun to swell. Mildly swollen left leg. Right leg appears to be worse. No changes in meds since 07/10/2014

## 2014-07-17 NOTE — Telephone Encounter (Signed)
Spoke to pharmacy and confirmed that they have the refills that were ordered in October.   Called pt, LVM for pt to call back regarding rx and sx.

## 2014-07-17 NOTE — Telephone Encounter (Signed)
States Pharmacy would not fill lasix.  Patient has been out of fluid pill since December 19th.  Patient has gained 21 pounds in two weeks.  Patients BP is elevated to 170/80.  Requesting call back in regards.

## 2014-07-18 DIAGNOSIS — E669 Obesity, unspecified: Secondary | ICD-10-CM | POA: Diagnosis not present

## 2014-07-18 NOTE — Telephone Encounter (Signed)
Pt calling back on status? pls call pt [phone]

## 2014-07-18 NOTE — Telephone Encounter (Signed)
Informed pt and her son that rx is ready and that [facility] was [facility] yesterday and confirmed that there were refill ready to be picked up the Lasix.    FYI: there are two phone notes regarding the below note.

## 2014-07-19 DIAGNOSIS — E662 Morbid (severe) obesity with alveolar hypoventilation: Secondary | ICD-10-CM | POA: Diagnosis not present

## 2014-07-19 DIAGNOSIS — Z993 Dependence on wheelchair: Secondary | ICD-10-CM | POA: Diagnosis not present

## 2014-07-19 DIAGNOSIS — I10 Essential (primary) hypertension: Secondary | ICD-10-CM | POA: Diagnosis not present

## 2014-07-19 DIAGNOSIS — Z87440 Personal history of urinary (tract) infections: Secondary | ICD-10-CM | POA: Diagnosis not present

## 2014-07-19 DIAGNOSIS — R339 Retention of urine, unspecified: Secondary | ICD-10-CM | POA: Diagnosis not present

## 2014-07-19 DIAGNOSIS — E669 Obesity, unspecified: Secondary | ICD-10-CM | POA: Diagnosis not present

## 2014-07-20 DIAGNOSIS — E669 Obesity, unspecified: Secondary | ICD-10-CM | POA: Diagnosis not present

## 2014-07-21 DIAGNOSIS — E669 Obesity, unspecified: Secondary | ICD-10-CM | POA: Diagnosis not present

## 2014-07-22 DIAGNOSIS — E669 Obesity, unspecified: Secondary | ICD-10-CM | POA: Diagnosis not present

## 2014-07-23 DIAGNOSIS — Z87440 Personal history of urinary (tract) infections: Secondary | ICD-10-CM | POA: Diagnosis not present

## 2014-07-23 DIAGNOSIS — E669 Obesity, unspecified: Secondary | ICD-10-CM | POA: Diagnosis not present

## 2014-07-23 DIAGNOSIS — E662 Morbid (severe) obesity with alveolar hypoventilation: Secondary | ICD-10-CM | POA: Diagnosis not present

## 2014-07-23 DIAGNOSIS — I10 Essential (primary) hypertension: Secondary | ICD-10-CM | POA: Diagnosis not present

## 2014-07-23 DIAGNOSIS — R339 Retention of urine, unspecified: Secondary | ICD-10-CM | POA: Diagnosis not present

## 2014-07-23 DIAGNOSIS — Z993 Dependence on wheelchair: Secondary | ICD-10-CM | POA: Diagnosis not present

## 2014-07-24 DIAGNOSIS — E669 Obesity, unspecified: Secondary | ICD-10-CM | POA: Diagnosis not present

## 2014-07-25 DIAGNOSIS — E669 Obesity, unspecified: Secondary | ICD-10-CM | POA: Diagnosis not present

## 2014-07-26 DIAGNOSIS — E669 Obesity, unspecified: Secondary | ICD-10-CM | POA: Diagnosis not present

## 2014-07-27 DIAGNOSIS — E669 Obesity, unspecified: Secondary | ICD-10-CM | POA: Diagnosis not present

## 2014-07-28 DIAGNOSIS — E669 Obesity, unspecified: Secondary | ICD-10-CM | POA: Diagnosis not present

## 2014-07-29 DIAGNOSIS — E669 Obesity, unspecified: Secondary | ICD-10-CM | POA: Diagnosis not present

## 2014-07-30 DIAGNOSIS — E669 Obesity, unspecified: Secondary | ICD-10-CM | POA: Diagnosis not present

## 2014-07-31 DIAGNOSIS — E669 Obesity, unspecified: Secondary | ICD-10-CM | POA: Diagnosis not present

## 2014-08-01 DIAGNOSIS — E662 Morbid (severe) obesity with alveolar hypoventilation: Secondary | ICD-10-CM | POA: Diagnosis not present

## 2014-08-01 DIAGNOSIS — Z993 Dependence on wheelchair: Secondary | ICD-10-CM | POA: Diagnosis not present

## 2014-08-01 DIAGNOSIS — E669 Obesity, unspecified: Secondary | ICD-10-CM | POA: Diagnosis not present

## 2014-08-01 DIAGNOSIS — Z87440 Personal history of urinary (tract) infections: Secondary | ICD-10-CM | POA: Diagnosis not present

## 2014-08-01 DIAGNOSIS — R339 Retention of urine, unspecified: Secondary | ICD-10-CM | POA: Diagnosis not present

## 2014-08-01 DIAGNOSIS — I10 Essential (primary) hypertension: Secondary | ICD-10-CM | POA: Diagnosis not present

## 2014-08-02 DIAGNOSIS — E669 Obesity, unspecified: Secondary | ICD-10-CM | POA: Diagnosis not present

## 2014-08-03 DIAGNOSIS — E669 Obesity, unspecified: Secondary | ICD-10-CM | POA: Diagnosis not present

## 2014-08-04 DIAGNOSIS — E669 Obesity, unspecified: Secondary | ICD-10-CM | POA: Diagnosis not present

## 2014-08-05 DIAGNOSIS — E669 Obesity, unspecified: Secondary | ICD-10-CM | POA: Diagnosis not present

## 2014-08-06 DIAGNOSIS — E669 Obesity, unspecified: Secondary | ICD-10-CM | POA: Diagnosis not present

## 2014-08-07 DIAGNOSIS — E669 Obesity, unspecified: Secondary | ICD-10-CM | POA: Diagnosis not present

## 2014-08-08 DIAGNOSIS — M6281 Muscle weakness (generalized): Secondary | ICD-10-CM | POA: Diagnosis not present

## 2014-08-08 DIAGNOSIS — E785 Hyperlipidemia, unspecified: Secondary | ICD-10-CM | POA: Diagnosis not present

## 2014-08-08 DIAGNOSIS — E669 Obesity, unspecified: Secondary | ICD-10-CM | POA: Diagnosis not present

## 2014-08-08 DIAGNOSIS — I10 Essential (primary) hypertension: Secondary | ICD-10-CM | POA: Diagnosis not present

## 2014-08-09 DIAGNOSIS — E669 Obesity, unspecified: Secondary | ICD-10-CM | POA: Diagnosis not present

## 2014-08-10 DIAGNOSIS — E669 Obesity, unspecified: Secondary | ICD-10-CM | POA: Diagnosis not present

## 2014-08-11 DIAGNOSIS — M6281 Muscle weakness (generalized): Secondary | ICD-10-CM | POA: Diagnosis not present

## 2014-08-11 DIAGNOSIS — G8929 Other chronic pain: Secondary | ICD-10-CM | POA: Diagnosis not present

## 2014-08-11 DIAGNOSIS — E669 Obesity, unspecified: Secondary | ICD-10-CM | POA: Diagnosis not present

## 2014-08-11 DIAGNOSIS — J9610 Chronic respiratory failure, unspecified whether with hypoxia or hypercapnia: Secondary | ICD-10-CM | POA: Diagnosis not present

## 2014-08-11 DIAGNOSIS — M5416 Radiculopathy, lumbar region: Secondary | ICD-10-CM | POA: Diagnosis not present

## 2014-08-12 DIAGNOSIS — E669 Obesity, unspecified: Secondary | ICD-10-CM | POA: Diagnosis not present

## 2014-08-13 DIAGNOSIS — E669 Obesity, unspecified: Secondary | ICD-10-CM | POA: Diagnosis not present

## 2014-08-14 DIAGNOSIS — E669 Obesity, unspecified: Secondary | ICD-10-CM | POA: Diagnosis not present

## 2014-08-15 DIAGNOSIS — E669 Obesity, unspecified: Secondary | ICD-10-CM | POA: Diagnosis not present

## 2014-08-16 DIAGNOSIS — E669 Obesity, unspecified: Secondary | ICD-10-CM | POA: Diagnosis not present

## 2014-08-16 DIAGNOSIS — J9610 Chronic respiratory failure, unspecified whether with hypoxia or hypercapnia: Secondary | ICD-10-CM | POA: Diagnosis not present

## 2014-08-16 DIAGNOSIS — J449 Chronic obstructive pulmonary disease, unspecified: Secondary | ICD-10-CM | POA: Diagnosis not present

## 2014-08-17 DIAGNOSIS — E669 Obesity, unspecified: Secondary | ICD-10-CM | POA: Diagnosis not present

## 2014-08-18 DIAGNOSIS — E669 Obesity, unspecified: Secondary | ICD-10-CM | POA: Diagnosis not present

## 2014-08-19 DIAGNOSIS — E669 Obesity, unspecified: Secondary | ICD-10-CM | POA: Diagnosis not present

## 2014-08-20 DIAGNOSIS — E669 Obesity, unspecified: Secondary | ICD-10-CM | POA: Diagnosis not present

## 2014-08-21 DIAGNOSIS — E669 Obesity, unspecified: Secondary | ICD-10-CM | POA: Diagnosis not present

## 2014-08-22 DIAGNOSIS — E669 Obesity, unspecified: Secondary | ICD-10-CM | POA: Diagnosis not present

## 2014-08-23 DIAGNOSIS — E669 Obesity, unspecified: Secondary | ICD-10-CM | POA: Diagnosis not present

## 2014-08-24 DIAGNOSIS — E669 Obesity, unspecified: Secondary | ICD-10-CM | POA: Diagnosis not present

## 2014-08-25 DIAGNOSIS — E669 Obesity, unspecified: Secondary | ICD-10-CM | POA: Diagnosis not present

## 2014-08-26 DIAGNOSIS — E669 Obesity, unspecified: Secondary | ICD-10-CM | POA: Diagnosis not present

## 2014-08-27 DIAGNOSIS — E669 Obesity, unspecified: Secondary | ICD-10-CM | POA: Diagnosis not present

## 2014-08-28 DIAGNOSIS — E669 Obesity, unspecified: Secondary | ICD-10-CM | POA: Diagnosis not present

## 2014-08-29 DIAGNOSIS — E669 Obesity, unspecified: Secondary | ICD-10-CM | POA: Diagnosis not present

## 2014-08-30 DIAGNOSIS — E669 Obesity, unspecified: Secondary | ICD-10-CM | POA: Diagnosis not present

## 2014-08-31 DIAGNOSIS — E669 Obesity, unspecified: Secondary | ICD-10-CM | POA: Diagnosis not present

## 2014-09-01 DIAGNOSIS — E669 Obesity, unspecified: Secondary | ICD-10-CM | POA: Diagnosis not present

## 2014-09-02 ENCOUNTER — Ambulatory Visit: Payer: Commercial Managed Care - HMO | Admitting: General Practice

## 2014-09-02 DIAGNOSIS — E669 Obesity, unspecified: Secondary | ICD-10-CM | POA: Diagnosis not present

## 2014-09-03 DIAGNOSIS — E669 Obesity, unspecified: Secondary | ICD-10-CM | POA: Diagnosis not present

## 2014-09-04 DIAGNOSIS — E669 Obesity, unspecified: Secondary | ICD-10-CM | POA: Diagnosis not present

## 2014-09-05 DIAGNOSIS — E669 Obesity, unspecified: Secondary | ICD-10-CM | POA: Diagnosis not present

## 2014-09-06 DIAGNOSIS — I10 Essential (primary) hypertension: Secondary | ICD-10-CM | POA: Diagnosis not present

## 2014-09-06 DIAGNOSIS — E669 Obesity, unspecified: Secondary | ICD-10-CM | POA: Diagnosis not present

## 2014-09-06 DIAGNOSIS — E785 Hyperlipidemia, unspecified: Secondary | ICD-10-CM | POA: Diagnosis not present

## 2014-09-06 DIAGNOSIS — M6281 Muscle weakness (generalized): Secondary | ICD-10-CM | POA: Diagnosis not present

## 2014-09-07 DIAGNOSIS — E669 Obesity, unspecified: Secondary | ICD-10-CM | POA: Diagnosis not present

## 2014-09-08 DIAGNOSIS — E669 Obesity, unspecified: Secondary | ICD-10-CM | POA: Diagnosis not present

## 2014-09-09 DIAGNOSIS — M5416 Radiculopathy, lumbar region: Secondary | ICD-10-CM | POA: Diagnosis not present

## 2014-09-09 DIAGNOSIS — M6281 Muscle weakness (generalized): Secondary | ICD-10-CM | POA: Diagnosis not present

## 2014-09-09 DIAGNOSIS — E669 Obesity, unspecified: Secondary | ICD-10-CM | POA: Diagnosis not present

## 2014-09-09 DIAGNOSIS — G8929 Other chronic pain: Secondary | ICD-10-CM | POA: Diagnosis not present

## 2014-09-09 DIAGNOSIS — J9610 Chronic respiratory failure, unspecified whether with hypoxia or hypercapnia: Secondary | ICD-10-CM | POA: Diagnosis not present

## 2014-09-10 DIAGNOSIS — E669 Obesity, unspecified: Secondary | ICD-10-CM | POA: Diagnosis not present

## 2014-09-11 DIAGNOSIS — E669 Obesity, unspecified: Secondary | ICD-10-CM | POA: Diagnosis not present

## 2014-09-12 ENCOUNTER — Ambulatory Visit (INDEPENDENT_AMBULATORY_CARE_PROVIDER_SITE_OTHER)
Admission: RE | Admit: 2014-09-12 | Discharge: 2014-09-12 | Disposition: A | Discharge status: Home / Self Care | Payer: Commercial Managed Care - HMO | Source: Ambulatory Visit | Attending: Internal Medicine | Admitting: Internal Medicine

## 2014-09-12 ENCOUNTER — Encounter: Payer: Self-pay | Admitting: Internal Medicine

## 2014-09-12 ENCOUNTER — Ambulatory Visit (INDEPENDENT_AMBULATORY_CARE_PROVIDER_SITE_OTHER): Payer: Commercial Managed Care - HMO | Admitting: Internal Medicine

## 2014-09-12 VITALS — BP 132/70 | HR 65 | Ht 62.0 in | Wt 245.8 lb

## 2014-09-12 DIAGNOSIS — J9612 Chronic respiratory failure with hypercapnia: Secondary | ICD-10-CM

## 2014-09-12 DIAGNOSIS — G4733 Obstructive sleep apnea (adult) (pediatric): Secondary | ICD-10-CM

## 2014-09-12 DIAGNOSIS — J9691 Respiratory failure, unspecified with hypoxia: Secondary | ICD-10-CM | POA: Diagnosis not present

## 2014-09-12 DIAGNOSIS — E669 Obesity, unspecified: Secondary | ICD-10-CM | POA: Diagnosis not present

## 2014-09-12 NOTE — Progress Notes (Signed)
07/09/14 -81 yoF never smoker Referred by Dr Asa Lente for sleep medicine consultation. Current CPAP. Wears O2 2L [employer] during day and bled through CPAP( auto 4-14) at night. Epworth Score: 4     son here-she lives with him Hx hip fx 09/2013, goiter surgery complicated by CO2 retention 12/27/13 @ [facility], DVT bilateral UE(? PE). Now off warfarin. Becoming more ambulatory but mostly wheelchair bound for several years by obesity and osteoarthritis. Apparently goiter was very large, constricting airway and contributing to hypoxia and CO2 retention. Son reports little snoring and she denies daytime sleepiness, with no history of obstructive sleep apnea documented. CPAP is for chronic respiratory failure. She is hoping that she can come off of oxygen and CPAP. She denies past known lung disease.  09/12/14- 81 yo F never smoker followed for OSA, complicated by DVT, obesity, had hypercapneic resp failure at time of goiter surgery   Husband here CPAP auto 4-14, continuous O2 2L/ [employer] Can sit on room air for 5 minutes but needs 2 L oxygen for any exertion. No cough or phlegm. Strangles easily drinking thin liquids. ABG on 2 L oxygen 07/09/2014-pH 7.36, PCO2 52.8, PO2 101, HCO3 29.6 CXR 07/09/14 IMPRESSION: 1. Bibasilar subsegmental atelectasis with findings suggesting right base pneumonia. 2. Stable cardiomegaly. Prior median sternotomy . Interim resolution of previously identified large paratracheal mass/goiter. 3. Severe degenerative changes both shoulders with changes suggesting avascular necrosis both humeral heads. Electronically Signed  By: Marcello Moores Register  On: 07/09/2014 14:50  ROS-see HPI Constitutional:   No-   weight loss, night sweats, fevers, chills, fatigue, lassitude. HEENT:   No-  headaches, difficulty swallowing, tooth/dental problems, sore throat,       No-  sneezing, itching, ear ache, nasal congestion, post nasal drip,  CV:  No-   chest pain, orthopnea, PND, swelling in lower  extremities, anasarca,                                  dizziness, palpitations Resp: +shortness of breath with exertion or at rest.              + productive cough,  No non-productive cough,  No- coughing up of blood.              No-   change in color of mucus.  No- wheezing.   Skin: No-   rash or lesions. GI:  + heartburn, No- indigestion, abdominal pain, nausea, vomiting,  GU: . MS:  No-   joint pain or swelling.   Neuro-     nothing unusual Psych:  No- change in mood or affect. No depression or anxiety.  No memory loss.  OBJ- Physical Exam  + obesity, walker, O2 L General- Alert, Oriented, Affect-appropriate, Distress- none acute Skin- rash-none, lesions- none, excoriation- none Lymphadenopathy- none Head- atraumatic            Eyes- Gross vision intact, PERRLA, conjunctivae and secretions clear            Ears- Hearing, canals-normal            Nose- Clear, no-Septal dev, mucus, polyps, erosion, perforation             Throat- Mallampati III-IV , mucosa clear , drainage- none, tonsils- atrophic, + dentures Neck- flexible , trachea midline, no stridor , thyroid nl, carotid no bruit Chest - symmetrical excursion , unlabored  Heart/CV- RRR , no murmur , no gallop  , no rub, nl s1 s2                           - JVD- none , edema- none, stasis changes- none, varices- none           Lung- + distant especially on right, +too heavy to percuss dullness, wheeze- none, cough- none , dullness-none, rub- none           Chest wall-  Abd-  Br/ Gen/ Rectal- Not done, not indicated Extrem- cyanosis- none, clubbing, none, atrophy- none, strength- nl, + heavy without definite edema,  Homan's- neg Neuro- grossly intact to observation

## 2014-09-12 NOTE — Patient Instructions (Signed)
Order CXR  Dx chronic hypoxic respiratory failure  When you are siting quietly, you can be off your oxygen or set it on 1L With activity, set oxygen on 2L

## 2014-09-13 DIAGNOSIS — E669 Obesity, unspecified: Secondary | ICD-10-CM | POA: Diagnosis not present

## 2014-09-14 DIAGNOSIS — E669 Obesity, unspecified: Secondary | ICD-10-CM | POA: Diagnosis not present

## 2014-09-14 DIAGNOSIS — J9610 Chronic respiratory failure, unspecified whether with hypoxia or hypercapnia: Secondary | ICD-10-CM | POA: Diagnosis not present

## 2014-09-14 DIAGNOSIS — J449 Chronic obstructive pulmonary disease, unspecified: Secondary | ICD-10-CM | POA: Diagnosis not present

## 2014-09-15 ENCOUNTER — Encounter: Payer: Self-pay | Admitting: Internal Medicine

## 2014-09-15 DIAGNOSIS — G4733 Obstructive sleep apnea (adult) (pediatric): Secondary | ICD-10-CM | POA: Insufficient documentation

## 2014-09-15 DIAGNOSIS — E669 Obesity, unspecified: Secondary | ICD-10-CM | POA: Diagnosis not present

## 2014-09-15 NOTE — Assessment & Plan Note (Signed)
Oxygen at 1 L should be sufficient while awakened at rest, 2 L with exertion and sleep. Chest x-ray to follow-up question of pneumonia in January

## 2014-09-16 DIAGNOSIS — E669 Obesity, unspecified: Secondary | ICD-10-CM | POA: Diagnosis not present

## 2014-09-17 DIAGNOSIS — E669 Obesity, unspecified: Secondary | ICD-10-CM | POA: Diagnosis not present

## 2014-09-18 DIAGNOSIS — E669 Obesity, unspecified: Secondary | ICD-10-CM | POA: Diagnosis not present

## 2014-09-19 DIAGNOSIS — E669 Obesity, unspecified: Secondary | ICD-10-CM | POA: Diagnosis not present

## 2014-09-20 DIAGNOSIS — E669 Obesity, unspecified: Secondary | ICD-10-CM | POA: Diagnosis not present

## 2014-09-21 DIAGNOSIS — E669 Obesity, unspecified: Secondary | ICD-10-CM | POA: Diagnosis not present

## 2014-09-22 DIAGNOSIS — E669 Obesity, unspecified: Secondary | ICD-10-CM | POA: Diagnosis not present

## 2014-09-23 DIAGNOSIS — E669 Obesity, unspecified: Secondary | ICD-10-CM | POA: Diagnosis not present

## 2014-09-24 DIAGNOSIS — E669 Obesity, unspecified: Secondary | ICD-10-CM | POA: Diagnosis not present

## 2014-09-25 DIAGNOSIS — E669 Obesity, unspecified: Secondary | ICD-10-CM | POA: Diagnosis not present

## 2014-09-26 DIAGNOSIS — E669 Obesity, unspecified: Secondary | ICD-10-CM | POA: Diagnosis not present

## 2014-09-27 DIAGNOSIS — E669 Obesity, unspecified: Secondary | ICD-10-CM | POA: Diagnosis not present

## 2014-09-28 DIAGNOSIS — E669 Obesity, unspecified: Secondary | ICD-10-CM | POA: Diagnosis not present

## 2014-09-29 DIAGNOSIS — E669 Obesity, unspecified: Secondary | ICD-10-CM | POA: Diagnosis not present

## 2014-09-30 DIAGNOSIS — E669 Obesity, unspecified: Secondary | ICD-10-CM | POA: Diagnosis not present

## 2014-10-01 DIAGNOSIS — E669 Obesity, unspecified: Secondary | ICD-10-CM | POA: Diagnosis not present

## 2014-10-02 DIAGNOSIS — E669 Obesity, unspecified: Secondary | ICD-10-CM | POA: Diagnosis not present

## 2014-10-04 DIAGNOSIS — E669 Obesity, unspecified: Secondary | ICD-10-CM | POA: Diagnosis not present

## 2014-10-05 DIAGNOSIS — E669 Obesity, unspecified: Secondary | ICD-10-CM | POA: Diagnosis not present

## 2014-10-06 DIAGNOSIS — E669 Obesity, unspecified: Secondary | ICD-10-CM | POA: Diagnosis not present

## 2014-10-07 DIAGNOSIS — E785 Hyperlipidemia, unspecified: Secondary | ICD-10-CM | POA: Diagnosis not present

## 2014-10-07 DIAGNOSIS — E669 Obesity, unspecified: Secondary | ICD-10-CM | POA: Diagnosis not present

## 2014-10-07 DIAGNOSIS — M6281 Muscle weakness (generalized): Secondary | ICD-10-CM | POA: Diagnosis not present

## 2014-10-07 DIAGNOSIS — I10 Essential (primary) hypertension: Secondary | ICD-10-CM | POA: Diagnosis not present

## 2014-10-08 ENCOUNTER — Telehealth: Payer: Self-pay

## 2014-10-08 DIAGNOSIS — E669 Obesity, unspecified: Secondary | ICD-10-CM | POA: Diagnosis not present

## 2014-10-08 NOTE — Telephone Encounter (Signed)
Call to home number; [phone] as given by son. Explained [place] to the patient and she agreed to come in for this visit but just moved back home and transportation is a barrier. Sent out information by mail regarding transportation in [place] from [employer] site;   Plan is to fup in 2 weeks to see if transportation has been resolved and if so, will schedule AWV.

## 2014-10-09 DIAGNOSIS — E669 Obesity, unspecified: Secondary | ICD-10-CM | POA: Diagnosis not present

## 2014-10-10 DIAGNOSIS — M5416 Radiculopathy, lumbar region: Secondary | ICD-10-CM | POA: Diagnosis not present

## 2014-10-10 DIAGNOSIS — M6281 Muscle weakness (generalized): Secondary | ICD-10-CM | POA: Diagnosis not present

## 2014-10-10 DIAGNOSIS — J9610 Chronic respiratory failure, unspecified whether with hypoxia or hypercapnia: Secondary | ICD-10-CM | POA: Diagnosis not present

## 2014-10-10 DIAGNOSIS — G8929 Other chronic pain: Secondary | ICD-10-CM | POA: Diagnosis not present

## 2014-10-10 DIAGNOSIS — E669 Obesity, unspecified: Secondary | ICD-10-CM | POA: Diagnosis not present

## 2014-10-11 DIAGNOSIS — E669 Obesity, unspecified: Secondary | ICD-10-CM | POA: Diagnosis not present

## 2014-10-12 DIAGNOSIS — E669 Obesity, unspecified: Secondary | ICD-10-CM | POA: Diagnosis not present

## 2014-10-13 DIAGNOSIS — E669 Obesity, unspecified: Secondary | ICD-10-CM | POA: Diagnosis not present

## 2014-10-14 DIAGNOSIS — E669 Obesity, unspecified: Secondary | ICD-10-CM | POA: Diagnosis not present

## 2014-10-15 DIAGNOSIS — J9610 Chronic respiratory failure, unspecified whether with hypoxia or hypercapnia: Secondary | ICD-10-CM | POA: Diagnosis not present

## 2014-10-15 DIAGNOSIS — E669 Obesity, unspecified: Secondary | ICD-10-CM | POA: Diagnosis not present

## 2014-10-15 DIAGNOSIS — J449 Chronic obstructive pulmonary disease, unspecified: Secondary | ICD-10-CM | POA: Diagnosis not present

## 2014-10-16 DIAGNOSIS — E669 Obesity, unspecified: Secondary | ICD-10-CM | POA: Diagnosis not present

## 2014-10-17 DIAGNOSIS — E669 Obesity, unspecified: Secondary | ICD-10-CM | POA: Diagnosis not present

## 2014-10-18 DIAGNOSIS — E669 Obesity, unspecified: Secondary | ICD-10-CM | POA: Diagnosis not present

## 2014-10-19 DIAGNOSIS — E669 Obesity, unspecified: Secondary | ICD-10-CM | POA: Diagnosis not present

## 2014-10-20 DIAGNOSIS — E669 Obesity, unspecified: Secondary | ICD-10-CM | POA: Diagnosis not present

## 2014-10-21 DIAGNOSIS — M6281 Muscle weakness (generalized): Secondary | ICD-10-CM | POA: Diagnosis not present

## 2014-10-22 DIAGNOSIS — M6281 Muscle weakness (generalized): Secondary | ICD-10-CM | POA: Diagnosis not present

## 2014-10-23 DIAGNOSIS — M6281 Muscle weakness (generalized): Secondary | ICD-10-CM | POA: Diagnosis not present

## 2014-10-24 DIAGNOSIS — M6281 Muscle weakness (generalized): Secondary | ICD-10-CM | POA: Diagnosis not present

## 2014-10-25 DIAGNOSIS — M6281 Muscle weakness (generalized): Secondary | ICD-10-CM | POA: Diagnosis not present

## 2014-10-25 NOTE — Telephone Encounter (Signed)
Cal back to [place]; an home number is now [phone] and changed this in demo  Patient called to educate on Medicare Wellness apt. LVM for the patient to call back to educate and schedule for wellness visit.  Also, sent out transportation information and called to see if she rec'd it or had questions.

## 2014-10-26 DIAGNOSIS — M6281 Muscle weakness (generalized): Secondary | ICD-10-CM | POA: Diagnosis not present

## 2014-10-27 DIAGNOSIS — M6281 Muscle weakness (generalized): Secondary | ICD-10-CM | POA: Diagnosis not present

## 2014-10-28 DIAGNOSIS — J9620 Acute and chronic respiratory failure, unspecified whether with hypoxia or hypercapnia: Secondary | ICD-10-CM | POA: Diagnosis not present

## 2014-10-29 DIAGNOSIS — J9620 Acute and chronic respiratory failure, unspecified whether with hypoxia or hypercapnia: Secondary | ICD-10-CM | POA: Diagnosis not present

## 2014-10-30 DIAGNOSIS — J9620 Acute and chronic respiratory failure, unspecified whether with hypoxia or hypercapnia: Secondary | ICD-10-CM | POA: Diagnosis not present

## 2014-10-31 DIAGNOSIS — J9620 Acute and chronic respiratory failure, unspecified whether with hypoxia or hypercapnia: Secondary | ICD-10-CM | POA: Diagnosis not present

## 2014-11-01 DIAGNOSIS — J9620 Acute and chronic respiratory failure, unspecified whether with hypoxia or hypercapnia: Secondary | ICD-10-CM | POA: Diagnosis not present

## 2014-11-02 DIAGNOSIS — J9620 Acute and chronic respiratory failure, unspecified whether with hypoxia or hypercapnia: Secondary | ICD-10-CM | POA: Diagnosis not present

## 2014-11-03 DIAGNOSIS — J9620 Acute and chronic respiratory failure, unspecified whether with hypoxia or hypercapnia: Secondary | ICD-10-CM | POA: Diagnosis not present

## 2014-11-04 DIAGNOSIS — E669 Obesity, unspecified: Secondary | ICD-10-CM | POA: Diagnosis not present

## 2014-11-05 DIAGNOSIS — E669 Obesity, unspecified: Secondary | ICD-10-CM | POA: Diagnosis not present

## 2014-11-06 DIAGNOSIS — E669 Obesity, unspecified: Secondary | ICD-10-CM | POA: Diagnosis not present

## 2014-11-06 DIAGNOSIS — E785 Hyperlipidemia, unspecified: Secondary | ICD-10-CM | POA: Diagnosis not present

## 2014-11-06 DIAGNOSIS — M6281 Muscle weakness (generalized): Secondary | ICD-10-CM | POA: Diagnosis not present

## 2014-11-06 DIAGNOSIS — I10 Essential (primary) hypertension: Secondary | ICD-10-CM | POA: Diagnosis not present

## 2014-11-07 DIAGNOSIS — E669 Obesity, unspecified: Secondary | ICD-10-CM | POA: Diagnosis not present

## 2014-11-08 ENCOUNTER — Other Ambulatory Visit: Payer: Self-pay | Admitting: Internal Medicine

## 2014-11-08 DIAGNOSIS — E669 Obesity, unspecified: Secondary | ICD-10-CM | POA: Diagnosis not present

## 2014-11-08 NOTE — Telephone Encounter (Signed)
Called and left message advising patient that calcitrol [facility]

## 2014-11-09 DIAGNOSIS — M5416 Radiculopathy, lumbar region: Secondary | ICD-10-CM | POA: Diagnosis not present

## 2014-11-09 DIAGNOSIS — J9610 Chronic respiratory failure, unspecified whether with hypoxia or hypercapnia: Secondary | ICD-10-CM | POA: Diagnosis not present

## 2014-11-09 DIAGNOSIS — M6281 Muscle weakness (generalized): Secondary | ICD-10-CM | POA: Diagnosis not present

## 2014-11-09 DIAGNOSIS — E669 Obesity, unspecified: Secondary | ICD-10-CM | POA: Diagnosis not present

## 2014-11-09 DIAGNOSIS — G8929 Other chronic pain: Secondary | ICD-10-CM | POA: Diagnosis not present

## 2014-11-10 DIAGNOSIS — E669 Obesity, unspecified: Secondary | ICD-10-CM | POA: Diagnosis not present

## 2014-11-11 ENCOUNTER — Telehealth: Payer: Self-pay

## 2014-11-11 DIAGNOSIS — E669 Obesity, unspecified: Secondary | ICD-10-CM | POA: Diagnosis not present

## 2014-11-11 NOTE — Telephone Encounter (Signed)
Spoke to Shawna Fuentes; Letter to arrive Also, since time is limited, will send out medicare pre heath assessment which will be optional if she would like to complete prior to our visit.  Advised her son to call and schedule the patient a fup with Dr. Asa Lente.,

## 2014-11-11 NOTE — Telephone Encounter (Signed)
Call to schedule AWV ; Spoke with the patient and directed to son. Call to Mr. Buchta and stated that he could bring the patient in early on 7/19 for AWV at 9:30 prior to apt with Pulmonary at 10:15.  Will send out a letter stating this. Also discussed fup with Dr. Asa Lente around early May, but the first available apt is in no apt is in September. Asked if the patient wanted to see another doctor in May and schedule for Dr. Asa Lente in Sept and stated that if she has needs, would call but otherwise, will see Dr. Asa Lente in September.  Letter out regarding apt for July 19th at 9:30 for AWV and will see Pulmonary at 10:15.  Will call and schedule apt with Dr. Asa Lente.

## 2014-11-12 DIAGNOSIS — E669 Obesity, unspecified: Secondary | ICD-10-CM | POA: Diagnosis not present

## 2014-11-13 DIAGNOSIS — E669 Obesity, unspecified: Secondary | ICD-10-CM | POA: Diagnosis not present

## 2014-11-14 DIAGNOSIS — E669 Obesity, unspecified: Secondary | ICD-10-CM | POA: Diagnosis not present

## 2014-11-14 DIAGNOSIS — J449 Chronic obstructive pulmonary disease, unspecified: Secondary | ICD-10-CM | POA: Diagnosis not present

## 2014-11-14 DIAGNOSIS — J9610 Chronic respiratory failure, unspecified whether with hypoxia or hypercapnia: Secondary | ICD-10-CM | POA: Diagnosis not present

## 2014-11-15 DIAGNOSIS — E669 Obesity, unspecified: Secondary | ICD-10-CM | POA: Diagnosis not present

## 2014-11-16 DIAGNOSIS — E669 Obesity, unspecified: Secondary | ICD-10-CM | POA: Diagnosis not present

## 2014-11-17 DIAGNOSIS — E669 Obesity, unspecified: Secondary | ICD-10-CM | POA: Diagnosis not present

## 2014-11-18 DIAGNOSIS — M6281 Muscle weakness (generalized): Secondary | ICD-10-CM | POA: Diagnosis not present

## 2014-11-19 DIAGNOSIS — M6281 Muscle weakness (generalized): Secondary | ICD-10-CM | POA: Diagnosis not present

## 2014-11-20 DIAGNOSIS — M6281 Muscle weakness (generalized): Secondary | ICD-10-CM | POA: Diagnosis not present

## 2014-11-21 DIAGNOSIS — M6281 Muscle weakness (generalized): Secondary | ICD-10-CM | POA: Diagnosis not present

## 2014-11-22 DIAGNOSIS — M6281 Muscle weakness (generalized): Secondary | ICD-10-CM | POA: Diagnosis not present

## 2014-11-23 DIAGNOSIS — M6281 Muscle weakness (generalized): Secondary | ICD-10-CM | POA: Diagnosis not present

## 2014-11-24 DIAGNOSIS — M6281 Muscle weakness (generalized): Secondary | ICD-10-CM | POA: Diagnosis not present

## 2014-11-25 DIAGNOSIS — E669 Obesity, unspecified: Secondary | ICD-10-CM | POA: Diagnosis not present

## 2014-11-26 DIAGNOSIS — E669 Obesity, unspecified: Secondary | ICD-10-CM | POA: Diagnosis not present

## 2014-11-27 DIAGNOSIS — E669 Obesity, unspecified: Secondary | ICD-10-CM | POA: Diagnosis not present

## 2014-11-28 DIAGNOSIS — E669 Obesity, unspecified: Secondary | ICD-10-CM | POA: Diagnosis not present

## 2014-11-29 DIAGNOSIS — E669 Obesity, unspecified: Secondary | ICD-10-CM | POA: Diagnosis not present

## 2014-11-30 DIAGNOSIS — E669 Obesity, unspecified: Secondary | ICD-10-CM | POA: Diagnosis not present

## 2014-12-01 DIAGNOSIS — E669 Obesity, unspecified: Secondary | ICD-10-CM | POA: Diagnosis not present

## 2014-12-02 DIAGNOSIS — E669 Obesity, unspecified: Secondary | ICD-10-CM | POA: Diagnosis not present

## 2014-12-03 DIAGNOSIS — E669 Obesity, unspecified: Secondary | ICD-10-CM | POA: Diagnosis not present

## 2014-12-04 DIAGNOSIS — E669 Obesity, unspecified: Secondary | ICD-10-CM | POA: Diagnosis not present

## 2014-12-05 DIAGNOSIS — E669 Obesity, unspecified: Secondary | ICD-10-CM | POA: Diagnosis not present

## 2014-12-06 DIAGNOSIS — E669 Obesity, unspecified: Secondary | ICD-10-CM | POA: Diagnosis not present

## 2014-12-07 DIAGNOSIS — I10 Essential (primary) hypertension: Secondary | ICD-10-CM | POA: Diagnosis not present

## 2014-12-07 DIAGNOSIS — E669 Obesity, unspecified: Secondary | ICD-10-CM | POA: Diagnosis not present

## 2014-12-07 DIAGNOSIS — M6281 Muscle weakness (generalized): Secondary | ICD-10-CM | POA: Diagnosis not present

## 2014-12-07 DIAGNOSIS — E785 Hyperlipidemia, unspecified: Secondary | ICD-10-CM | POA: Diagnosis not present

## 2014-12-08 DIAGNOSIS — E669 Obesity, unspecified: Secondary | ICD-10-CM | POA: Diagnosis not present

## 2014-12-09 ENCOUNTER — Telehealth: Payer: Self-pay | Admitting: Internal Medicine

## 2014-12-09 DIAGNOSIS — E669 Obesity, unspecified: Secondary | ICD-10-CM | POA: Diagnosis not present

## 2014-12-10 DIAGNOSIS — M6281 Muscle weakness (generalized): Secondary | ICD-10-CM | POA: Diagnosis not present

## 2014-12-10 DIAGNOSIS — G8929 Other chronic pain: Secondary | ICD-10-CM | POA: Diagnosis not present

## 2014-12-10 DIAGNOSIS — J9610 Chronic respiratory failure, unspecified whether with hypoxia or hypercapnia: Secondary | ICD-10-CM | POA: Diagnosis not present

## 2014-12-10 DIAGNOSIS — M5416 Radiculopathy, lumbar region: Secondary | ICD-10-CM | POA: Diagnosis not present

## 2014-12-10 DIAGNOSIS — E669 Obesity, unspecified: Secondary | ICD-10-CM | POA: Diagnosis not present

## 2014-12-11 DIAGNOSIS — E669 Obesity, unspecified: Secondary | ICD-10-CM | POA: Diagnosis not present

## 2014-12-12 DIAGNOSIS — E669 Obesity, unspecified: Secondary | ICD-10-CM | POA: Diagnosis not present

## 2014-12-13 DIAGNOSIS — E669 Obesity, unspecified: Secondary | ICD-10-CM | POA: Diagnosis not present

## 2014-12-14 DIAGNOSIS — E669 Obesity, unspecified: Secondary | ICD-10-CM | POA: Diagnosis not present

## 2014-12-15 DIAGNOSIS — J9610 Chronic respiratory failure, unspecified whether with hypoxia or hypercapnia: Secondary | ICD-10-CM | POA: Diagnosis not present

## 2014-12-15 DIAGNOSIS — J449 Chronic obstructive pulmonary disease, unspecified: Secondary | ICD-10-CM | POA: Diagnosis not present

## 2014-12-15 DIAGNOSIS — E669 Obesity, unspecified: Secondary | ICD-10-CM | POA: Diagnosis not present

## 2014-12-16 DIAGNOSIS — E669 Obesity, unspecified: Secondary | ICD-10-CM | POA: Diagnosis not present

## 2014-12-17 DIAGNOSIS — E669 Obesity, unspecified: Secondary | ICD-10-CM | POA: Diagnosis not present

## 2014-12-18 DIAGNOSIS — E669 Obesity, unspecified: Secondary | ICD-10-CM | POA: Diagnosis not present

## 2014-12-19 ENCOUNTER — Ambulatory Visit: Payer: Commercial Managed Care - HMO | Admitting: Internal Medicine

## 2014-12-19 DIAGNOSIS — E669 Obesity, unspecified: Secondary | ICD-10-CM | POA: Diagnosis not present

## 2014-12-20 DIAGNOSIS — E669 Obesity, unspecified: Secondary | ICD-10-CM | POA: Diagnosis not present

## 2014-12-21 DIAGNOSIS — E669 Obesity, unspecified: Secondary | ICD-10-CM | POA: Diagnosis not present

## 2014-12-22 DIAGNOSIS — E669 Obesity, unspecified: Secondary | ICD-10-CM | POA: Diagnosis not present

## 2014-12-23 DIAGNOSIS — E669 Obesity, unspecified: Secondary | ICD-10-CM | POA: Diagnosis not present

## 2014-12-24 DIAGNOSIS — E669 Obesity, unspecified: Secondary | ICD-10-CM | POA: Diagnosis not present

## 2014-12-25 DIAGNOSIS — E669 Obesity, unspecified: Secondary | ICD-10-CM | POA: Diagnosis not present

## 2014-12-26 DIAGNOSIS — E669 Obesity, unspecified: Secondary | ICD-10-CM | POA: Diagnosis not present

## 2014-12-27 DIAGNOSIS — E669 Obesity, unspecified: Secondary | ICD-10-CM | POA: Diagnosis not present

## 2014-12-28 DIAGNOSIS — E669 Obesity, unspecified: Secondary | ICD-10-CM | POA: Diagnosis not present

## 2014-12-29 DIAGNOSIS — E669 Obesity, unspecified: Secondary | ICD-10-CM | POA: Diagnosis not present

## 2014-12-30 DIAGNOSIS — E669 Obesity, unspecified: Secondary | ICD-10-CM | POA: Diagnosis not present

## 2014-12-31 DIAGNOSIS — E669 Obesity, unspecified: Secondary | ICD-10-CM | POA: Diagnosis not present

## 2015-01-01 DIAGNOSIS — E669 Obesity, unspecified: Secondary | ICD-10-CM | POA: Diagnosis not present

## 2015-01-02 DIAGNOSIS — E669 Obesity, unspecified: Secondary | ICD-10-CM | POA: Diagnosis not present

## 2015-01-03 DIAGNOSIS — E669 Obesity, unspecified: Secondary | ICD-10-CM | POA: Diagnosis not present

## 2015-01-03 DIAGNOSIS — J9610 Chronic respiratory failure, unspecified whether with hypoxia or hypercapnia: Secondary | ICD-10-CM | POA: Diagnosis not present

## 2015-01-03 DIAGNOSIS — J449 Chronic obstructive pulmonary disease, unspecified: Secondary | ICD-10-CM | POA: Diagnosis not present

## 2015-01-04 DIAGNOSIS — E669 Obesity, unspecified: Secondary | ICD-10-CM | POA: Diagnosis not present

## 2015-01-05 DIAGNOSIS — E669 Obesity, unspecified: Secondary | ICD-10-CM | POA: Diagnosis not present

## 2015-01-06 DIAGNOSIS — E669 Obesity, unspecified: Secondary | ICD-10-CM | POA: Diagnosis not present

## 2015-01-07 DIAGNOSIS — E669 Obesity, unspecified: Secondary | ICD-10-CM | POA: Diagnosis not present

## 2015-01-08 DIAGNOSIS — E669 Obesity, unspecified: Secondary | ICD-10-CM | POA: Diagnosis not present

## 2015-01-09 DIAGNOSIS — J9610 Chronic respiratory failure, unspecified whether with hypoxia or hypercapnia: Secondary | ICD-10-CM | POA: Diagnosis not present

## 2015-01-09 DIAGNOSIS — M6281 Muscle weakness (generalized): Secondary | ICD-10-CM | POA: Diagnosis not present

## 2015-01-09 DIAGNOSIS — M5416 Radiculopathy, lumbar region: Secondary | ICD-10-CM | POA: Diagnosis not present

## 2015-01-09 DIAGNOSIS — G8929 Other chronic pain: Secondary | ICD-10-CM | POA: Diagnosis not present

## 2015-01-09 DIAGNOSIS — E669 Obesity, unspecified: Secondary | ICD-10-CM | POA: Diagnosis not present

## 2015-01-10 DIAGNOSIS — E669 Obesity, unspecified: Secondary | ICD-10-CM | POA: Diagnosis not present

## 2015-01-11 DIAGNOSIS — E669 Obesity, unspecified: Secondary | ICD-10-CM | POA: Diagnosis not present

## 2015-01-12 DIAGNOSIS — E669 Obesity, unspecified: Secondary | ICD-10-CM | POA: Diagnosis not present

## 2015-01-13 DIAGNOSIS — J9620 Acute and chronic respiratory failure, unspecified whether with hypoxia or hypercapnia: Secondary | ICD-10-CM | POA: Diagnosis not present

## 2015-01-14 DIAGNOSIS — J9610 Chronic respiratory failure, unspecified whether with hypoxia or hypercapnia: Secondary | ICD-10-CM | POA: Diagnosis not present

## 2015-01-14 DIAGNOSIS — J9620 Acute and chronic respiratory failure, unspecified whether with hypoxia or hypercapnia: Secondary | ICD-10-CM | POA: Diagnosis not present

## 2015-01-14 DIAGNOSIS — J449 Chronic obstructive pulmonary disease, unspecified: Secondary | ICD-10-CM | POA: Diagnosis not present

## 2015-01-15 DIAGNOSIS — J9620 Acute and chronic respiratory failure, unspecified whether with hypoxia or hypercapnia: Secondary | ICD-10-CM | POA: Diagnosis not present

## 2015-01-16 ENCOUNTER — Telehealth: Payer: Self-pay | Admitting: Internal Medicine

## 2015-01-16 DIAGNOSIS — J9612 Chronic respiratory failure with hypercapnia: Secondary | ICD-10-CM

## 2015-01-16 DIAGNOSIS — J9620 Acute and chronic respiratory failure, unspecified whether with hypoxia or hypercapnia: Secondary | ICD-10-CM | POA: Diagnosis not present

## 2015-01-16 DIAGNOSIS — G4733 Obstructive sleep apnea (adult) (pediatric): Secondary | ICD-10-CM

## 2015-01-16 NOTE — Telephone Encounter (Signed)
Referral entered  

## 2015-01-16 NOTE — Telephone Encounter (Signed)
[employer] auth submitted. Josem Kaufmann #[id number] valid 01/16/15-07/15/15 for 6 visits

## 2015-01-16 NOTE — Telephone Encounter (Signed)
Pulmonary called and they are needing a [employer] referral for patients appointment on 7/19

## 2015-01-16 NOTE — Telephone Encounter (Signed)
Contacted [employer] and confirmed what they needed. OV printed and faxed for their record.

## 2015-01-16 NOTE — Telephone Encounter (Signed)
States [employer] needs documentation patient is on oxygen in order for bill to go to that company instead of patient.  Patient is also requesting a small oxygen pack to carry instead of rolling oxygen behind her.

## 2015-01-17 DIAGNOSIS — J9620 Acute and chronic respiratory failure, unspecified whether with hypoxia or hypercapnia: Secondary | ICD-10-CM | POA: Diagnosis not present

## 2015-01-18 DIAGNOSIS — J9620 Acute and chronic respiratory failure, unspecified whether with hypoxia or hypercapnia: Secondary | ICD-10-CM | POA: Diagnosis not present

## 2015-01-19 DIAGNOSIS — J9620 Acute and chronic respiratory failure, unspecified whether with hypoxia or hypercapnia: Secondary | ICD-10-CM | POA: Diagnosis not present

## 2015-01-20 DIAGNOSIS — J9620 Acute and chronic respiratory failure, unspecified whether with hypoxia or hypercapnia: Secondary | ICD-10-CM | POA: Diagnosis not present

## 2015-01-21 ENCOUNTER — Encounter: Payer: Self-pay | Admitting: Internal Medicine

## 2015-01-21 ENCOUNTER — Ambulatory Visit (INDEPENDENT_AMBULATORY_CARE_PROVIDER_SITE_OTHER): Payer: Commercial Managed Care - HMO | Admitting: Internal Medicine

## 2015-01-21 ENCOUNTER — Ambulatory Visit (INDEPENDENT_AMBULATORY_CARE_PROVIDER_SITE_OTHER): Payer: Commercial Managed Care - HMO

## 2015-01-21 VITALS — BP 124/62 | HR 59 | Ht 62.0 in | Wt 245.0 lb

## 2015-01-21 VITALS — BP 138/68 | Ht 62.5 in | Wt 245.2 lb

## 2015-01-21 DIAGNOSIS — J9612 Chronic respiratory failure with hypercapnia: Secondary | ICD-10-CM

## 2015-01-21 DIAGNOSIS — G4733 Obstructive sleep apnea (adult) (pediatric): Secondary | ICD-10-CM | POA: Diagnosis not present

## 2015-01-21 DIAGNOSIS — J9620 Acute and chronic respiratory failure, unspecified whether with hypoxia or hypercapnia: Secondary | ICD-10-CM | POA: Diagnosis not present

## 2015-01-21 DIAGNOSIS — Z0000 Encounter for general adult medical examination without abnormal findings: Secondary | ICD-10-CM

## 2015-01-21 NOTE — Patient Instructions (Signed)
Please call as needed 

## 2015-01-21 NOTE — Assessment & Plan Note (Signed)
Based on exam together with known paresis R hemidiaphragm, she is almost certain to have Obesity-Hypoventilation Syndrome.  Weight loss would help. Plan- continue O2. Avoid sedatives that would reduce ventilatory drive.

## 2015-01-21 NOTE — Patient Instructions (Addendum)
Shawna Fuentes , Thank you for taking time to come for your Medicare Wellness Visit. I appreciate your ongoing commitment to your health goals. Please review the following plan we discussed and let me know if I can assist you in the future.   Will consider taking Tdap later due to cost  Will make apt with Dr. Asa Lente  Will consider Hearing screen;  [facility] [place]  No reviews  [facility]  [id number]  [phone]  Will make apt to have eyes examined; will get referral from insurer.    These are the goals we discussed: Goals    . come off of cpap      States she hopes get off oxygen; working to come off;   Also wants to go to church and will discuss portable tank so she can go to church       This is a list of the screening recommended for you and due dates:  Health Maintenance  Topic Date Due  . Tetanus Vaccine  12/21/1951  . Shingles Vaccine  12/22/1994  . Flu Shot  02/03/2015  . Colon Cancer Screening  12/30/2022  . DEXA scan (bone density measurement)  Completed  . Pneumonia vaccines  Completed     Fall Prevention and Home Safety Falls cause injuries and can affect all age groups. It is possible to prevent falls.  HOW TO PREVENT FALLS  Wear shoes with rubber soles that do not have an opening for your toes.  Keep the inside and outside of your house well lit.  Use night lights throughout your home.  Remove clutter from floors.  Clean up floor spills.  Remove throw rugs or fasten them to the floor with carpet tape.  Do not place electrical cords across pathways.  Put grab bars by your tub, shower, and toilet. Do not use towel bars as grab bars.  Put handrails on both sides of the stairway. Fix loose handrails.  Do not climb on stools or stepladders, if possible.  Do not wax your floors.  Repair uneven or unsafe sidewalks, walkways, or stairs.  Keep items you use a lot within reach.  Be aware of  pets.  Keep emergency numbers next to the telephone.  Put smoke detectors in your home and near bedrooms. Ask your doctor what other things you can do to prevent falls. Document Released: 04/17/2009 Document Revised: 12/21/2011 Document Reviewed: 09/21/2011 [facility] Patient Information 2015 [place], [place]. This information is not intended to replace advice given to you by your health care provider. Make sure you discuss any questions you have with your health care provider.  Health Maintenance Adopting a healthy lifestyle and getting preventive care can go a long way to promote health and wellness. Talk with your health care provider about what schedule of regular examinations is right for you. This is a good chance for you to check in with your provider about disease prevention and staying healthy. In between checkups, there are plenty of things you can do on your own. Experts have done a lot of research about which lifestyle changes and preventive measures are most likely to keep you healthy. Ask your health care provider for more information. WEIGHT AND DIET  Eat a healthy diet  Be sure to include plenty of vegetables, fruits, low-fat dairy products, and lean protein.  Do not eat a lot of foods high in solid fats, added sugars, or salt.  Get regular exercise. This  is one of the most important things you can do for your health.  Most adults should exercise for at least 150 minutes each week. The exercise should increase your heart rate and make you sweat (moderate-intensity exercise).  Most adults should also do strengthening exercises at least twice a week. This is in addition to the moderate-intensity exercise.  Maintain a healthy weight  Body mass index (BMI) is a measurement that can be used to identify possible weight problems. It estimates body fat based on height and weight. Your health care provider can help determine your BMI and help you achieve or maintain a healthy  weight.  For females 31 years of age and older:   A BMI below 18.5 is considered underweight.  A BMI of 18.5 to 24.9 is normal.  A BMI of 25 to 29.9 is considered overweight.  A BMI of 30 and above is considered obese.  Watch levels of cholesterol and blood lipids  You should start having your blood tested for lipids and cholesterol at 79 years of age, then have this test every 5 years.  You may need to have your cholesterol levels checked more often if:  Your lipid or cholesterol levels are high.  You are older than 79 years of age.  You are at high risk for heart disease.  CANCER SCREENING   Lung Cancer  Lung cancer screening is recommended for adults 57-38 years old who are at high risk for lung cancer because of a history of smoking.  A yearly low-dose CT scan of the lungs is recommended for people who:  Currently smoke.  Have quit within the past 15 years.  Have at least a 30-pack-year history of smoking. A pack year is smoking an average of one pack of cigarettes a day for 1 year.  Yearly screening should continue until it has been 15 years since you quit.  Yearly screening should stop if you develop a health problem that would prevent you from having lung cancer treatment.  Breast Cancer  Practice breast self-awareness. This means understanding how your breasts normally appear and feel.  It also means doing regular breast self-exams. Let your health care provider know about any changes, no matter how small.  If you are in your 20s or 30s, you should have a clinical breast exam (CBE) by a health care provider every 1-3 years as part of a regular health exam.  If you are 60 or older, have a CBE every year. Also consider having a breast X-ray (mammogram) every year.  If you have a family history of breast cancer, talk to your health care provider about genetic screening.  If you are at high risk for breast cancer, talk to your health care provider about  having an MRI and a mammogram every year.  Breast cancer gene (BRCA) assessment is recommended for women who have family members with BRCA-related cancers. BRCA-related cancers include:  Breast.  Ovarian.  Tubal.  Peritoneal cancers.  Results of the assessment will determine the need for genetic counseling and BRCA1 and BRCA2 testing. Cervical Cancer Routine pelvic examinations to screen for cervical cancer are no longer recommended for nonpregnant women who are considered low risk for cancer of the pelvic organs (ovaries, uterus, and vagina) and who do not have symptoms. A pelvic examination may be necessary if you have symptoms including those associated with pelvic infections. Ask your health care provider if a screening pelvic exam is right for you.   The Pap test is  the screening test for cervical cancer for women who are considered at risk.  If you had a hysterectomy for a problem that was not cancer or a condition that could lead to cancer, then you no longer need Pap tests.  If you are older than 65 years, and you have had normal Pap tests for the past 10 years, you no longer need to have Pap tests.  If you have had past treatment for cervical cancer or a condition that could lead to cancer, you need Pap tests and screening for cancer for at least 20 years after your treatment.  If you no longer get a Pap test, assess your risk factors if they change (such as having a new sexual partner). This can affect whether you should start being screened again.  Some women have medical problems that increase their chance of getting cervical cancer. If this is the case for you, your health care provider may recommend more frequent screening and Pap tests.  The human papillomavirus (HPV) test is another test that may be used for cervical cancer screening. The HPV test looks for the virus that can cause cell changes in the cervix. The cells collected during the Pap test can be tested for  HPV.  The HPV test can be used to screen women 48 years of age and older. Getting tested for HPV can extend the interval between normal Pap tests from three to five years.  An HPV test also should be used to screen women of any age who have unclear Pap test results.  After 79 years of age, women should have HPV testing as often as Pap tests.  Colorectal Cancer  This type of cancer can be detected and often prevented.  Routine colorectal cancer screening usually begins at 79 years of age and continues through 79 years of age.  Your health care provider may recommend screening at an earlier age if you have risk factors for colon cancer.  Your health care provider may also recommend using home test kits to check for hidden blood in the stool.  A small camera at the end of a tube can be used to examine your colon directly (sigmoidoscopy or colonoscopy). This is done to check for the earliest forms of colorectal cancer.  Routine screening usually begins at age 59.  Direct examination of the colon should be repeated every 5-10 years through 79 years of age. However, you may need to be screened more often if early forms of precancerous polyps or small growths are found. Skin Cancer  Check your skin from head to toe regularly.  Tell your health care provider about any new moles or changes in moles, especially if there is a change in a mole's shape or color.  Also tell your health care provider if you have a mole that is larger than the size of a pencil eraser.  Always use sunscreen. Apply sunscreen liberally and repeatedly throughout the day.  Protect yourself by wearing long sleeves, pants, a wide-brimmed hat, and sunglasses whenever you are outside. HEART DISEASE, DIABETES, AND HIGH BLOOD PRESSURE   Have your blood pressure checked at least every 1-2 years. High blood pressure causes heart disease and increases the risk of stroke.  If you are between 59 years and 13 years old, ask  your health care provider if you should take aspirin to prevent strokes.  Have regular diabetes screenings. This involves taking a blood sample to check your fasting blood sugar level.  If you are  at a normal weight and have a low risk for diabetes, have this test once every three years after 79 years of age.  If you are overweight and have a high risk for diabetes, consider being tested at a younger age or more often. PREVENTING INFECTION  Hepatitis B  If you have a higher risk for hepatitis B, you should be screened for this virus. You are considered at high risk for hepatitis B if:  You were born in a country where hepatitis B is common. Ask your health care provider which countries are considered high risk.  Your parents were born in a high-risk country, and you have not been immunized against hepatitis B (hepatitis B vaccine).  You have HIV or AIDS.  You use needles to inject street drugs.  You live with someone who has hepatitis B.  You have had sex with someone who has hepatitis B.  You get hemodialysis treatment.  You take certain medicines for conditions, including cancer, organ transplantation, and autoimmune conditions. Hepatitis C  Blood testing is recommended for:  Everyone born from [birth year] through 1963.  Anyone with known risk factors for hepatitis C. Sexually transmitted infections (STIs)  You should be screened for sexually transmitted infections (STIs) including gonorrhea and chlamydia if:  You are sexually active and are younger than 79 years of age.  You are older than 79 years of age and your health care provider tells you that you are at risk for this type of infection.  Your sexual activity has changed since you were last screened and you are at an increased risk for chlamydia or gonorrhea. Ask your health care provider if you are at risk.  If you do not have HIV, but are at risk, it may be recommended that you take a prescription medicine daily to  prevent HIV infection. This is called pre-exposure prophylaxis (PrEP). You are considered at risk if:  You are sexually active and do not regularly use condoms or know the HIV status of your partner(s).  You take drugs by injection.  You are sexually active with a partner who has HIV. Talk with your health care provider about whether you are at high risk of being infected with HIV. If you choose to begin PrEP, you should first be tested for HIV. You should then be tested every 3 months for as long as you are taking PrEP.  PREGNANCY   If you are premenopausal and you may become pregnant, ask your health care provider about preconception counseling.  If you may become pregnant, take 400 to 800 micrograms (mcg) of folic acid every day.  If you want to prevent pregnancy, talk to your health care provider about birth control (contraception). OSTEOPOROSIS AND MENOPAUSE   Osteoporosis is a disease in which the bones lose minerals and strength with aging. This can result in serious bone fractures. Your risk for osteoporosis can be identified using a bone density scan.  If you are 71 years of age or older, or if you are at risk for osteoporosis and fractures, ask your health care provider if you should be screened.  Ask your health care provider whether you should take a calcium or vitamin D supplement to lower your risk for osteoporosis.  Menopause may have certain physical symptoms and risks.  Hormone replacement therapy may reduce some of these symptoms and risks. Talk to your health care provider about whether hormone replacement therapy is right for you.  HOME CARE INSTRUCTIONS   Schedule regular health,  dental, and eye exams.  Stay current with your immunizations.   Do not use any tobacco products including cigarettes, chewing tobacco, or electronic cigarettes.  If you are pregnant, do not drink alcohol.  If you are breastfeeding, limit how much and how often you drink  alcohol.  Limit alcohol intake to no more than 1 drink per day for nonpregnant women. One drink equals 12 ounces of beer, 5 ounces of wine, or 1 ounces of hard liquor.  Do not use street drugs.  Do not share needles.  Ask your health care provider for help if you need support or information about quitting drugs.  Tell your health care provider if you often feel depressed.  Tell your health care provider if you have ever been abused or do not feel safe at home. Document Released: 01/04/2011 Document Revised: 11/05/2013 Document Reviewed: 05/23/2013 [facility] Patient Information 2015 [place], [place]. This information is not intended to replace advice given to you by your health care provider. Make sure you discuss any questions you have with your health care provider.

## 2015-01-21 NOTE — Assessment & Plan Note (Signed)
Compliance and control are good at current pressure range are good. Husband confirms she sleeps better and is not snoring Plan- continue CPAP with O2

## 2015-01-21 NOTE — Progress Notes (Signed)
Subjective:   Shawna Fuentes is a 81 y.o. female who presents for Medicare Annual (Subsequent) preventive examination.  Review of Systems:  HRA assessment completed during visit; Shawna Fuentes;  Patient is here for Annual Wellness Assessment The Patient was informed that this wellness visit is to identify risk and educate on how to reduce risk for increase disease through lifestyle changes.  The patient verbalized understanding that any voiced medical issues still need to be directed to their physician at a follow up visit as this is not a "physical exam" in which medical issues are addressed and treated.  Personalized education given regarding risk on the problem list that are currently being medically managed;  Gaps in risk reviewed AD planning, functional assessment; pain assessment and fall risk secondary to back pain and OA.  Obesity discussed due to hypoventilation/  hx of 43ft 2in and weight 245 September 12, 2014 28ft 2.5in and wt 245lb  Weight is stable; states oxygen deprivation started with goiter; which was finally removed appox 81 yo. States she is doing well now.   Diet: Cannot cook; can't stand but so long; fix egg and grits;  Can't use microwave; aide assist with breakfast;  Can't get MOW due to aid   Exercise: Does chair exercises; (yellow band broke) Legs exercises; does all day and walks multiple times throughout the day At least 3 times in am and 3 times around 11am and then goes to the bathroom  ADL: Aide comes and assist with dressing;  Independent using the BSC during the daytime; Needs in the toilet;  Commode chair Children 5; and a grandson; all work but assist with care;   Education provided;   SAFETY Generalized Safety in the home reviewed/   Fall prevention;  Family with patient in the evening and nephew with her off and on during the day Verbalizes her safety protocols in the home. Does keep phone on person or walker when up and around;   Urinary or fecal  incontinence reviewed/ no issue verbalized  Regular vision checks Home safety; removal of throw rugs; bathroom handrails;  Continues to exercise off and on all day with multiple attempts to get up from chairl    Lifeline: http://www.lifelinesys.com/content/home; [phone])868-2524 [phone] discussed but no cost effective    Screenings overdue:  Ophthalmology exam; doctor told her she had small cataracts x 3 years Call insurer to see where she can go to get her eyes examined Immunizations; Tetanus overdue/ but noted allergic to tetanus; will discuss with the patient States she is scared of needles; son called her dtr to ask tetanus; Will consider getting tetanus at next apt with PCP so she can be prepared for payment;   Shingles- educated to check with insurer;  Colonoscopy; 12/29/2012/ fup due in 10 years EKG June 2015 Mammogram ordered in June 2014 Bone Density Jan 2016; -3.1 in forearm; -2.6 in spine Treated with Vit d and Calcium  Hearing: will call insurer for referral Dental: loose dentures now;   Current Care Team reviewed and updated  Cardiac Risk Factors include: advanced age (>33men, >98 women);dyslipidemia;family history of premature cardiovascular disease;hypertension;obesity (BMI >30kg/m2);sedentary lifestyle     Objective:     Vitals: BP 138/68 mmHg  Ht 5' 2.5" (1.588 m)  Wt 245 lb 4 oz (111.245 kg)  BMI 44.11 kg/m2  Tobacco History  Smoking status  . Never Smoker   Smokeless tobacco  . Not on file    Comment: lives w/ dtr Shawna Fuentes, 2nd dtr and  3 sons in town     Counseling given: Not Answered   Past Medical History  Diagnosis Date  . Arthritis   . Hyperlipidemia   . Hypertension   . History of kidney stones   . Hypothyroidism (acquired) 12/2013    s/p thyroidectomy 12/21/13 at [facility]  . Thyroid goiter     s/p thyroidectomy - pathology demonstrated nodular hyperplasia  . DVT (deep venous thrombosis) 12/2013    bilateral upper extremity DVT found at time  of thyroidectomy   . Osteoporosis 07/15/2014    DEXA @ [facility] 07/14/14: -3.1   Past Surgical History  Procedure Laterality Date  . Hip surgery    . Femur im nail Right 09/22/2013    Procedure: INTRAMEDULLARY (IM) NAIL FEMORAL;  Surgeon: Wylene Simmer, MD;  Location: [facility];  Service: Orthopedics;  Laterality: Right;  . Thyroidectomy with sternotomy  12/21/13    thyroid mass found incidentally on CT scan 12/2013; transferred to [facility] for surgical removal of thyroid and goiter requiring sternotomy   Family History  Problem Relation Age of Onset  . Osteoarthritis Mother   . Hypertension Mother   . Hypertension Father   . Diabetes Son   . Diabetes Son   . Cancer Father     unknown type  . Clotting disorder    . Rheumatologic disease     History  Sexual Activity  . Sexual Activity: Not on file    Outpatient Encounter Prescriptions as of 01/21/2015  Medication Sig  . aspirin 81 MG tablet Take 1 tablet (81 mg total) by mouth daily.  Marland Kitchen atorvastatin (LIPITOR) 10 MG tablet Take 1 tablet (10 mg total) by mouth every evening.  . calcitRIOL (ROCALTROL) 0.25 MCG capsule TAKE 1 CAPSULE BY MOUTH TWICE DAILY  . calcium carbonate (OS-CAL) 600 MG TABS tablet Take 1 tablet (600 mg total) by mouth 2 (two) times daily with a meal.  . famotidine (PEPCID) 10 MG tablet Take 1 tablet (10 mg total) by mouth 2 (two) times daily.  . furosemide (LASIX) 40 MG tablet Take 1 tablet (40 mg total) by mouth daily.  Marland Kitchen levothyroxine (SYNTHROID, LEVOTHROID) 150 MCG tablet Take 1 tablet (150 mcg total) by mouth daily before breakfast.  . metoprolol tartrate (LOPRESSOR) 25 MG tablet Take 0.5 tablets (12.5 mg total) by mouth 2 (two) times daily.  . sulindac (CLINORIL) 200 MG tablet Take 1 tablet (200 mg total) by mouth 2 (two) times daily.  . traMADol (ULTRAM) 50 MG tablet Take 50 mg by mouth every 8 (eight) hours as needed.  . valsartan (DIOVAN) 160 MG tablet Take 1 tablet (160 mg total) by mouth daily.  Marland Kitchen  HYDROcodone-acetaminophen (NORCO/VICODIN) 5-325 MG per tablet Take 1 tablet by mouth every 6 (six) hours as needed for moderate pain.  Marland Kitchen nystatin (MYCOSTATIN/NYSTOP) 100000 UNIT/GM POWD Apply to affected area 3 times a day (Patient not taking: Reported on 01/21/2015)   No facility-administered encounter medications on file as of 01/21/2015.    Activities of Daily Living In your present state of health, do you have any difficulty performing the following activities: 01/21/2015  Hearing? N  Vision? N  Difficulty concentrating or making decisions? N  Walking or climbing stairs? Y  Dressing or bathing? Y  Doing errands, shopping? Y  Preparing Food and eating ? Y  Using the Toilet? Y  In the past six months, have you accidently leaked urine? N  Do you have problems with loss of bowel control? N  Managing your Medications?  Y  Managing your Finances? Y  Housekeeping or managing your Housekeeping? Y    Patient Care Team: Rowe Clack, MD as PCP - General (Internal Medicine) Wylene Simmer, MD (Orthopedic Surgery)    Assessment:    Objective:   The goal of the wellness visit is to assist the patient how to close the gaps in care and create a preventative care plan for the patient.  Personalized Education was given regarding:   Pt determined a personalized goal; see patient goals; the patient is working on her exercise to try and come off cpap at hs; oxygen no 2 liters during the day and at hs. Request portable oxygen; will discuss with pulmonary.  Assessment included: No smoking hx. Bone density scan as appropriate Calcium and Vit D as appropriate/ Osteoporosis risk reviewed  Taking meds without issues; no barriers identified  Labs were and fup visit noted with MD if labs are due to be re-drawn.  Stress: Recommendations for managing stress if assessed as a factor;   No Risk for hepatitis or high risk social behavior identified via hepatitis screen  Educated on shingles and  follow up with insurance company for co-pays or charges applied to Part D benefit. Educated on Vaccines;  Safety issues reviewed;  Cognition assessed by AD8; Score 0 Discussed memory issue related to chronic disease and disability MMSE deferred as the patient stated they had no memory issues; No identified risk were noted; The patient was oriented x 3; appropriate in dress and manner and no objective failures at ADL's or IADL's.   Depression screen negative;   Functional status reviewed; no losses in function x 1 year  Bowel and bladder issues assessed  End of life planning was discussed; aging in home or other; plans to complete HCPOA were discussed if not completed Did agree to take HCPOA form    Exercise Activities and Dietary recommendations Current Exercise Habits:: Home exercise routine, Time (Minutes): 45 (accumulative exercise all day), Frequency (Times/Week): 5, Weekly Exercise (Minutes/Week): 225, Intensity: Mild  Goals    . come off of cpap      States she hopes get off oxygen; working to come off;   Also wants to go to church and will discuss portable tank so she can go to church      Fall Risk Fall Risk  01/21/2015 12/29/2012  Falls in the past year? No Yes  Risk for fall due to : Impaired balance/gait;Impaired mobility Impaired balance/gait;Impaired mobility   Depression Screen PHQ 2/9 Scores 01/21/2015 12/29/2012  PHQ - 2 Score 0 0     Cognitive Testing MMSE - Mini Mental State Exam 01/21/2015  Not completed: Unable to complete    Immunization History  Administered Date(s) Administered  . Influenza, High Dose Seasonal PF 04/20/2013, 04/09/2014  . Pneumococcal Conjugate-13 07/10/2014  . Pneumococcal-Unspecified 07/05/2006   Screening Tests Health Maintenance  Topic Date Due  . TETANUS/TDAP  12/21/1953  . ZOSTAVAX  12/22/1994  . INFLUENZA VACCINE  02/03/2015  . COLONOSCOPY  12/30/2022  . DEXA SCAN  Completed  . PNA vac Low Risk Adult  Completed       Plan:  During the course of the visit the patient was educated and counseled about the following appropriate screening and preventive services:   The patient agrees to take Tetanus next fup Also agrees to call insurer for participating doctor  Given information for [place] division of HOH for assistance with hearing aids if needed.    ccines to include Pneumoccal,  Influenza, Hepatitis B, Td, Zostavax, HCV  Electrocardiogram  Cardiovascular Disease  Colorectal cancer screening  Bone density screening  Diabetes screening / na  Glaucoma screening  Mammography/PAP  Nutrition counseling   Patient Instructions (the written plan) was given to the patient.   Wynetta Fines, RN  01/21/2015

## 2015-01-21 NOTE — Progress Notes (Signed)
07/09/14 -81 yoF never smoker Referred by Dr Asa Lente for sleep medicine consultation. Current CPAP. Wears O2 2L [employer] during day and bled through CPAP( auto 4-14) at night. Epworth Score: 4     son here-she lives with him Hx hip fx 09/2013, goiter surgery complicated by CO2 retention 12/27/13 @ [facility], DVT bilateral UE(? PE). Now off warfarin. Becoming more ambulatory but mostly wheelchair bound for several years by obesity and osteoarthritis. Apparently goiter was very large, constricting airway and contributing to hypoxia and CO2 retention. Son reports little snoring and she denies daytime sleepiness, with no history of obstructive sleep apnea documented. CPAP is for chronic respiratory failure. She is hoping that she can come off of oxygen and CPAP. She denies past known lung disease.  09/12/14- 74 yo F never smoker followed for OSA, complicated by DVT, obesity, had hypercapneic resp failure at time of goiter surgery   Husband here CPAP auto 4-14, continuous O2 2L/ [employer] Can sit on room air for 5 minutes but needs 2 L oxygen for any exertion. No cough or phlegm. Strangles easily drinking thin liquids. ABG on 2 L oxygen 07/09/2014-pH 7.36, PCO2 52.8, PO2 101, HCO3 29.6 CXR 07/09/14 IMPRESSION: 1. Bibasilar subsegmental atelectasis with findings suggesting right base pneumonia. 2. Stable cardiomegaly. Prior median sternotomy . Interim resolution of previously identified large paratracheal mass/goiter. 3. Severe degenerative changes both shoulders with changes suggesting avascular necrosis both humeral heads. Electronically Signed  By: Marcello Moores Register  On: 07/09/2014 14:50  01/21/15- 73 yo F never smoker followed for OSA, complicated by DVT, obesity, had hypercapneic resp failure at time of goiter surgery   Husband here FOLLOWS LKG:[id number] O2 2L/ [employer] all the time.Sob-same,no wheezing or coughing. CPAPauto 4-14/ [employer] doing well,pressure good.wears 7-8 hrs. each night. She has no complaints. We  reviewed CXR image and discussed her chronic R diaphragm elevation. CXR 09/12/14 IMPRESSION: 1. Chronic elevation of the right hemidiaphragm. No active lung disease. 2. Stable cardiomegaly. Electronically Signed  By: Ivar Drape M.D.  On: 09/12/2014 14:55  ROS-see HPI Constitutional:   No-   weight loss, night sweats, fevers, chills, fatigue, lassitude. HEENT:   No-  headaches, difficulty swallowing, tooth/dental problems, sore throat,       No-  sneezing, itching, ear ache, nasal congestion, post nasal drip,  CV:  No-   chest pain, orthopnea, PND, swelling in lower extremities, anasarca,                                                       dizziness, palpitations Resp: +shortness of breath with exertion or at rest.              productive cough,  No non-productive cough,  No- coughing up of blood.              No-   change in color of mucus.  No- wheezing.   Skin: No-   rash or lesions. GI:  + heartburn, No- indigestion, abdominal pain, nausea, vomiting,  GU: . MS:  No-   joint pain or swelling.   Neuro-     nothing unusual Psych:  No- change in mood or affect. No depression or anxiety.  No memory loss.  OBJ- Physical Exam  + obesity, +walker, O2 L General- Alert, Oriented, Affect-appropriate, Distress- none acute Skin- rash-none, lesions- none, excoriation- none  Lymphadenopathy- none Head- atraumatic            Eyes- Gross vision intact, PERRLA, conjunctivae and secretions clear            Ears- Hearing, canals-normal            Nose- Clear, no-Septal dev, mucus, polyps, erosion, perforation             Throat- Mallampati III-IV , mucosa clear , drainage- none, tonsils- atrophic, + dentures Neck- flexible , trachea midline, no stridor , thyroid nl, carotid no bruit Chest - symmetrical excursion , unlabored           Heart/CV- RRR , no murmur , no gallop  , no rub, nl s1 s2                           - JVD- none , edema- none, stasis changes- none, varices- none            Lung- + distant especially on right, +too heavy to percuss dullness, wheeze- none, cough- none , dullness-none, rub- none           Chest wall-  Abd-  Br/ Gen/ Rectal- Not done, not indicated Extrem- cyanosis- none, clubbing, none, atrophy- none, strength- nl, + heavy without definite edema,  Neuro- grossly intact to observation

## 2015-01-22 DIAGNOSIS — J9620 Acute and chronic respiratory failure, unspecified whether with hypoxia or hypercapnia: Secondary | ICD-10-CM | POA: Diagnosis not present

## 2015-01-23 DIAGNOSIS — J9620 Acute and chronic respiratory failure, unspecified whether with hypoxia or hypercapnia: Secondary | ICD-10-CM | POA: Diagnosis not present

## 2015-01-24 DIAGNOSIS — J9620 Acute and chronic respiratory failure, unspecified whether with hypoxia or hypercapnia: Secondary | ICD-10-CM | POA: Diagnosis not present

## 2015-01-25 DIAGNOSIS — J9620 Acute and chronic respiratory failure, unspecified whether with hypoxia or hypercapnia: Secondary | ICD-10-CM | POA: Diagnosis not present

## 2015-01-26 DIAGNOSIS — J9620 Acute and chronic respiratory failure, unspecified whether with hypoxia or hypercapnia: Secondary | ICD-10-CM | POA: Diagnosis not present

## 2015-01-27 DIAGNOSIS — E669 Obesity, unspecified: Secondary | ICD-10-CM | POA: Diagnosis not present

## 2015-01-28 DIAGNOSIS — E669 Obesity, unspecified: Secondary | ICD-10-CM | POA: Diagnosis not present

## 2015-01-29 DIAGNOSIS — E669 Obesity, unspecified: Secondary | ICD-10-CM | POA: Diagnosis not present

## 2015-01-30 DIAGNOSIS — E669 Obesity, unspecified: Secondary | ICD-10-CM | POA: Diagnosis not present

## 2015-01-31 DIAGNOSIS — E669 Obesity, unspecified: Secondary | ICD-10-CM | POA: Diagnosis not present

## 2015-02-01 DIAGNOSIS — E669 Obesity, unspecified: Secondary | ICD-10-CM | POA: Diagnosis not present

## 2015-02-02 DIAGNOSIS — E669 Obesity, unspecified: Secondary | ICD-10-CM | POA: Diagnosis not present

## 2015-02-03 DIAGNOSIS — E669 Obesity, unspecified: Secondary | ICD-10-CM | POA: Diagnosis not present

## 2015-02-04 DIAGNOSIS — E669 Obesity, unspecified: Secondary | ICD-10-CM | POA: Diagnosis not present

## 2015-02-05 DIAGNOSIS — E669 Obesity, unspecified: Secondary | ICD-10-CM | POA: Diagnosis not present

## 2015-02-06 DIAGNOSIS — E669 Obesity, unspecified: Secondary | ICD-10-CM | POA: Diagnosis not present

## 2015-02-07 DIAGNOSIS — E669 Obesity, unspecified: Secondary | ICD-10-CM | POA: Diagnosis not present

## 2015-02-08 DIAGNOSIS — E669 Obesity, unspecified: Secondary | ICD-10-CM | POA: Diagnosis not present

## 2015-02-09 DIAGNOSIS — M5416 Radiculopathy, lumbar region: Secondary | ICD-10-CM | POA: Diagnosis not present

## 2015-02-09 DIAGNOSIS — E669 Obesity, unspecified: Secondary | ICD-10-CM | POA: Diagnosis not present

## 2015-02-09 DIAGNOSIS — G8929 Other chronic pain: Secondary | ICD-10-CM | POA: Diagnosis not present

## 2015-02-09 DIAGNOSIS — M6281 Muscle weakness (generalized): Secondary | ICD-10-CM | POA: Diagnosis not present

## 2015-02-09 DIAGNOSIS — J9610 Chronic respiratory failure, unspecified whether with hypoxia or hypercapnia: Secondary | ICD-10-CM | POA: Diagnosis not present

## 2015-02-10 DIAGNOSIS — E669 Obesity, unspecified: Secondary | ICD-10-CM | POA: Diagnosis not present

## 2015-02-11 DIAGNOSIS — E669 Obesity, unspecified: Secondary | ICD-10-CM | POA: Diagnosis not present

## 2015-02-12 DIAGNOSIS — E669 Obesity, unspecified: Secondary | ICD-10-CM | POA: Diagnosis not present

## 2015-02-13 DIAGNOSIS — E669 Obesity, unspecified: Secondary | ICD-10-CM | POA: Diagnosis not present

## 2015-02-14 DIAGNOSIS — J9610 Chronic respiratory failure, unspecified whether with hypoxia or hypercapnia: Secondary | ICD-10-CM | POA: Diagnosis not present

## 2015-02-14 DIAGNOSIS — E669 Obesity, unspecified: Secondary | ICD-10-CM | POA: Diagnosis not present

## 2015-02-14 DIAGNOSIS — J449 Chronic obstructive pulmonary disease, unspecified: Secondary | ICD-10-CM | POA: Diagnosis not present

## 2015-02-15 DIAGNOSIS — E669 Obesity, unspecified: Secondary | ICD-10-CM | POA: Diagnosis not present

## 2015-02-16 DIAGNOSIS — E669 Obesity, unspecified: Secondary | ICD-10-CM | POA: Diagnosis not present

## 2015-02-17 DIAGNOSIS — J9620 Acute and chronic respiratory failure, unspecified whether with hypoxia or hypercapnia: Secondary | ICD-10-CM | POA: Diagnosis not present

## 2015-02-18 DIAGNOSIS — J9620 Acute and chronic respiratory failure, unspecified whether with hypoxia or hypercapnia: Secondary | ICD-10-CM | POA: Diagnosis not present

## 2015-02-19 DIAGNOSIS — J9620 Acute and chronic respiratory failure, unspecified whether with hypoxia or hypercapnia: Secondary | ICD-10-CM | POA: Diagnosis not present

## 2015-02-20 DIAGNOSIS — J9620 Acute and chronic respiratory failure, unspecified whether with hypoxia or hypercapnia: Secondary | ICD-10-CM | POA: Diagnosis not present

## 2015-02-21 DIAGNOSIS — J9620 Acute and chronic respiratory failure, unspecified whether with hypoxia or hypercapnia: Secondary | ICD-10-CM | POA: Diagnosis not present

## 2015-02-22 DIAGNOSIS — J9620 Acute and chronic respiratory failure, unspecified whether with hypoxia or hypercapnia: Secondary | ICD-10-CM | POA: Diagnosis not present

## 2015-02-23 DIAGNOSIS — J9620 Acute and chronic respiratory failure, unspecified whether with hypoxia or hypercapnia: Secondary | ICD-10-CM | POA: Diagnosis not present

## 2015-02-24 DIAGNOSIS — J9620 Acute and chronic respiratory failure, unspecified whether with hypoxia or hypercapnia: Secondary | ICD-10-CM | POA: Diagnosis not present

## 2015-02-25 DIAGNOSIS — J9620 Acute and chronic respiratory failure, unspecified whether with hypoxia or hypercapnia: Secondary | ICD-10-CM | POA: Diagnosis not present

## 2015-02-26 DIAGNOSIS — J9620 Acute and chronic respiratory failure, unspecified whether with hypoxia or hypercapnia: Secondary | ICD-10-CM | POA: Diagnosis not present

## 2015-02-27 DIAGNOSIS — J9620 Acute and chronic respiratory failure, unspecified whether with hypoxia or hypercapnia: Secondary | ICD-10-CM | POA: Diagnosis not present

## 2015-02-28 DIAGNOSIS — J9620 Acute and chronic respiratory failure, unspecified whether with hypoxia or hypercapnia: Secondary | ICD-10-CM | POA: Diagnosis not present

## 2015-03-01 DIAGNOSIS — J9620 Acute and chronic respiratory failure, unspecified whether with hypoxia or hypercapnia: Secondary | ICD-10-CM | POA: Diagnosis not present

## 2015-03-02 DIAGNOSIS — J9620 Acute and chronic respiratory failure, unspecified whether with hypoxia or hypercapnia: Secondary | ICD-10-CM | POA: Diagnosis not present

## 2015-03-03 DIAGNOSIS — E669 Obesity, unspecified: Secondary | ICD-10-CM | POA: Diagnosis not present

## 2015-03-04 DIAGNOSIS — E669 Obesity, unspecified: Secondary | ICD-10-CM | POA: Diagnosis not present

## 2015-03-05 DIAGNOSIS — E669 Obesity, unspecified: Secondary | ICD-10-CM | POA: Diagnosis not present

## 2015-03-06 DIAGNOSIS — E669 Obesity, unspecified: Secondary | ICD-10-CM | POA: Diagnosis not present

## 2015-03-07 DIAGNOSIS — E669 Obesity, unspecified: Secondary | ICD-10-CM | POA: Diagnosis not present

## 2015-03-08 DIAGNOSIS — E669 Obesity, unspecified: Secondary | ICD-10-CM | POA: Diagnosis not present

## 2015-03-09 DIAGNOSIS — E669 Obesity, unspecified: Secondary | ICD-10-CM | POA: Diagnosis not present

## 2015-03-10 DIAGNOSIS — J9620 Acute and chronic respiratory failure, unspecified whether with hypoxia or hypercapnia: Secondary | ICD-10-CM | POA: Diagnosis not present

## 2015-03-11 DIAGNOSIS — J9620 Acute and chronic respiratory failure, unspecified whether with hypoxia or hypercapnia: Secondary | ICD-10-CM | POA: Diagnosis not present

## 2015-03-12 DIAGNOSIS — M5416 Radiculopathy, lumbar region: Secondary | ICD-10-CM | POA: Diagnosis not present

## 2015-03-12 DIAGNOSIS — J9610 Chronic respiratory failure, unspecified whether with hypoxia or hypercapnia: Secondary | ICD-10-CM | POA: Diagnosis not present

## 2015-03-12 DIAGNOSIS — M6281 Muscle weakness (generalized): Secondary | ICD-10-CM | POA: Diagnosis not present

## 2015-03-12 DIAGNOSIS — G8929 Other chronic pain: Secondary | ICD-10-CM | POA: Diagnosis not present

## 2015-03-12 DIAGNOSIS — J9620 Acute and chronic respiratory failure, unspecified whether with hypoxia or hypercapnia: Secondary | ICD-10-CM | POA: Diagnosis not present

## 2015-03-13 DIAGNOSIS — J9620 Acute and chronic respiratory failure, unspecified whether with hypoxia or hypercapnia: Secondary | ICD-10-CM | POA: Diagnosis not present

## 2015-03-14 ENCOUNTER — Other Ambulatory Visit: Payer: Self-pay | Admitting: Internal Medicine

## 2015-03-14 DIAGNOSIS — J9620 Acute and chronic respiratory failure, unspecified whether with hypoxia or hypercapnia: Secondary | ICD-10-CM | POA: Diagnosis not present

## 2015-03-15 DIAGNOSIS — J9620 Acute and chronic respiratory failure, unspecified whether with hypoxia or hypercapnia: Secondary | ICD-10-CM | POA: Diagnosis not present

## 2015-03-16 DIAGNOSIS — J9620 Acute and chronic respiratory failure, unspecified whether with hypoxia or hypercapnia: Secondary | ICD-10-CM | POA: Diagnosis not present

## 2015-03-17 DIAGNOSIS — J9610 Chronic respiratory failure, unspecified whether with hypoxia or hypercapnia: Secondary | ICD-10-CM | POA: Diagnosis not present

## 2015-03-17 DIAGNOSIS — J449 Chronic obstructive pulmonary disease, unspecified: Secondary | ICD-10-CM | POA: Diagnosis not present

## 2015-03-17 DIAGNOSIS — J9620 Acute and chronic respiratory failure, unspecified whether with hypoxia or hypercapnia: Secondary | ICD-10-CM | POA: Diagnosis not present

## 2015-03-18 ENCOUNTER — Telehealth: Payer: Self-pay | Admitting: Internal Medicine

## 2015-03-18 DIAGNOSIS — J9620 Acute and chronic respiratory failure, unspecified whether with hypoxia or hypercapnia: Secondary | ICD-10-CM | POA: Diagnosis not present

## 2015-03-18 NOTE — Telephone Encounter (Signed)
Sending the request to Pulmonary.

## 2015-03-18 NOTE — Telephone Encounter (Signed)
Patient would like a portable oxygen tank because the one she has is to big to lug around.

## 2015-03-19 DIAGNOSIS — J9620 Acute and chronic respiratory failure, unspecified whether with hypoxia or hypercapnia: Secondary | ICD-10-CM | POA: Diagnosis not present

## 2015-03-20 DIAGNOSIS — J9620 Acute and chronic respiratory failure, unspecified whether with hypoxia or hypercapnia: Secondary | ICD-10-CM | POA: Diagnosis not present

## 2015-03-21 DIAGNOSIS — J9620 Acute and chronic respiratory failure, unspecified whether with hypoxia or hypercapnia: Secondary | ICD-10-CM | POA: Diagnosis not present

## 2015-03-22 DIAGNOSIS — J9620 Acute and chronic respiratory failure, unspecified whether with hypoxia or hypercapnia: Secondary | ICD-10-CM | POA: Diagnosis not present

## 2015-03-23 DIAGNOSIS — J9620 Acute and chronic respiratory failure, unspecified whether with hypoxia or hypercapnia: Secondary | ICD-10-CM | POA: Diagnosis not present

## 2015-03-24 DIAGNOSIS — J9620 Acute and chronic respiratory failure, unspecified whether with hypoxia or hypercapnia: Secondary | ICD-10-CM | POA: Diagnosis not present

## 2015-03-25 DIAGNOSIS — J9620 Acute and chronic respiratory failure, unspecified whether with hypoxia or hypercapnia: Secondary | ICD-10-CM | POA: Diagnosis not present

## 2015-03-26 DIAGNOSIS — J9620 Acute and chronic respiratory failure, unspecified whether with hypoxia or hypercapnia: Secondary | ICD-10-CM | POA: Diagnosis not present

## 2015-03-27 DIAGNOSIS — J9620 Acute and chronic respiratory failure, unspecified whether with hypoxia or hypercapnia: Secondary | ICD-10-CM | POA: Diagnosis not present

## 2015-03-28 DIAGNOSIS — J9620 Acute and chronic respiratory failure, unspecified whether with hypoxia or hypercapnia: Secondary | ICD-10-CM | POA: Diagnosis not present

## 2015-03-29 DIAGNOSIS — J9620 Acute and chronic respiratory failure, unspecified whether with hypoxia or hypercapnia: Secondary | ICD-10-CM | POA: Diagnosis not present

## 2015-03-30 DIAGNOSIS — J9620 Acute and chronic respiratory failure, unspecified whether with hypoxia or hypercapnia: Secondary | ICD-10-CM | POA: Diagnosis not present

## 2015-03-31 DIAGNOSIS — J9620 Acute and chronic respiratory failure, unspecified whether with hypoxia or hypercapnia: Secondary | ICD-10-CM | POA: Diagnosis not present

## 2015-04-01 DIAGNOSIS — J9620 Acute and chronic respiratory failure, unspecified whether with hypoxia or hypercapnia: Secondary | ICD-10-CM | POA: Diagnosis not present

## 2015-04-02 DIAGNOSIS — J9620 Acute and chronic respiratory failure, unspecified whether with hypoxia or hypercapnia: Secondary | ICD-10-CM | POA: Diagnosis not present

## 2015-04-03 DIAGNOSIS — J9620 Acute and chronic respiratory failure, unspecified whether with hypoxia or hypercapnia: Secondary | ICD-10-CM | POA: Diagnosis not present

## 2015-04-04 DIAGNOSIS — J9620 Acute and chronic respiratory failure, unspecified whether with hypoxia or hypercapnia: Secondary | ICD-10-CM | POA: Diagnosis not present

## 2015-04-05 DIAGNOSIS — J9620 Acute and chronic respiratory failure, unspecified whether with hypoxia or hypercapnia: Secondary | ICD-10-CM | POA: Diagnosis not present

## 2015-04-06 DIAGNOSIS — J9620 Acute and chronic respiratory failure, unspecified whether with hypoxia or hypercapnia: Secondary | ICD-10-CM | POA: Diagnosis not present

## 2015-04-07 DIAGNOSIS — J9620 Acute and chronic respiratory failure, unspecified whether with hypoxia or hypercapnia: Secondary | ICD-10-CM | POA: Diagnosis not present

## 2015-04-08 DIAGNOSIS — J9620 Acute and chronic respiratory failure, unspecified whether with hypoxia or hypercapnia: Secondary | ICD-10-CM | POA: Diagnosis not present

## 2015-04-09 DIAGNOSIS — J9620 Acute and chronic respiratory failure, unspecified whether with hypoxia or hypercapnia: Secondary | ICD-10-CM | POA: Diagnosis not present

## 2015-04-10 DIAGNOSIS — J9620 Acute and chronic respiratory failure, unspecified whether with hypoxia or hypercapnia: Secondary | ICD-10-CM | POA: Diagnosis not present

## 2015-04-11 DIAGNOSIS — G8929 Other chronic pain: Secondary | ICD-10-CM | POA: Diagnosis not present

## 2015-04-11 DIAGNOSIS — J9620 Acute and chronic respiratory failure, unspecified whether with hypoxia or hypercapnia: Secondary | ICD-10-CM | POA: Diagnosis not present

## 2015-04-11 DIAGNOSIS — M6281 Muscle weakness (generalized): Secondary | ICD-10-CM | POA: Diagnosis not present

## 2015-04-11 DIAGNOSIS — M5416 Radiculopathy, lumbar region: Secondary | ICD-10-CM | POA: Diagnosis not present

## 2015-04-11 DIAGNOSIS — J9610 Chronic respiratory failure, unspecified whether with hypoxia or hypercapnia: Secondary | ICD-10-CM | POA: Diagnosis not present

## 2015-04-12 DIAGNOSIS — J9620 Acute and chronic respiratory failure, unspecified whether with hypoxia or hypercapnia: Secondary | ICD-10-CM | POA: Diagnosis not present

## 2015-04-13 DIAGNOSIS — J9620 Acute and chronic respiratory failure, unspecified whether with hypoxia or hypercapnia: Secondary | ICD-10-CM | POA: Diagnosis not present

## 2015-04-14 DIAGNOSIS — J9620 Acute and chronic respiratory failure, unspecified whether with hypoxia or hypercapnia: Secondary | ICD-10-CM | POA: Diagnosis not present

## 2015-04-15 DIAGNOSIS — J9620 Acute and chronic respiratory failure, unspecified whether with hypoxia or hypercapnia: Secondary | ICD-10-CM | POA: Diagnosis not present

## 2015-04-16 DIAGNOSIS — J449 Chronic obstructive pulmonary disease, unspecified: Secondary | ICD-10-CM | POA: Diagnosis not present

## 2015-04-16 DIAGNOSIS — J9620 Acute and chronic respiratory failure, unspecified whether with hypoxia or hypercapnia: Secondary | ICD-10-CM | POA: Diagnosis not present

## 2015-04-16 DIAGNOSIS — J9610 Chronic respiratory failure, unspecified whether with hypoxia or hypercapnia: Secondary | ICD-10-CM | POA: Diagnosis not present

## 2015-04-17 DIAGNOSIS — J9620 Acute and chronic respiratory failure, unspecified whether with hypoxia or hypercapnia: Secondary | ICD-10-CM | POA: Diagnosis not present

## 2015-04-18 DIAGNOSIS — J9620 Acute and chronic respiratory failure, unspecified whether with hypoxia or hypercapnia: Secondary | ICD-10-CM | POA: Diagnosis not present

## 2015-04-19 DIAGNOSIS — J9620 Acute and chronic respiratory failure, unspecified whether with hypoxia or hypercapnia: Secondary | ICD-10-CM | POA: Diagnosis not present

## 2015-04-20 DIAGNOSIS — J9620 Acute and chronic respiratory failure, unspecified whether with hypoxia or hypercapnia: Secondary | ICD-10-CM | POA: Diagnosis not present

## 2015-04-21 DIAGNOSIS — J9620 Acute and chronic respiratory failure, unspecified whether with hypoxia or hypercapnia: Secondary | ICD-10-CM | POA: Diagnosis not present

## 2015-04-22 DIAGNOSIS — J9620 Acute and chronic respiratory failure, unspecified whether with hypoxia or hypercapnia: Secondary | ICD-10-CM | POA: Diagnosis not present

## 2015-04-23 DIAGNOSIS — J9620 Acute and chronic respiratory failure, unspecified whether with hypoxia or hypercapnia: Secondary | ICD-10-CM | POA: Diagnosis not present

## 2015-04-24 DIAGNOSIS — J9620 Acute and chronic respiratory failure, unspecified whether with hypoxia or hypercapnia: Secondary | ICD-10-CM | POA: Diagnosis not present

## 2015-04-25 DIAGNOSIS — J9620 Acute and chronic respiratory failure, unspecified whether with hypoxia or hypercapnia: Secondary | ICD-10-CM | POA: Diagnosis not present

## 2015-04-26 DIAGNOSIS — J9620 Acute and chronic respiratory failure, unspecified whether with hypoxia or hypercapnia: Secondary | ICD-10-CM | POA: Diagnosis not present

## 2015-04-27 DIAGNOSIS — J9620 Acute and chronic respiratory failure, unspecified whether with hypoxia or hypercapnia: Secondary | ICD-10-CM | POA: Diagnosis not present

## 2015-04-28 DIAGNOSIS — J9620 Acute and chronic respiratory failure, unspecified whether with hypoxia or hypercapnia: Secondary | ICD-10-CM | POA: Diagnosis not present

## 2015-04-29 DIAGNOSIS — J9620 Acute and chronic respiratory failure, unspecified whether with hypoxia or hypercapnia: Secondary | ICD-10-CM | POA: Diagnosis not present

## 2015-04-30 ENCOUNTER — Other Ambulatory Visit: Payer: Self-pay | Admitting: Internal Medicine

## 2015-04-30 DIAGNOSIS — J9620 Acute and chronic respiratory failure, unspecified whether with hypoxia or hypercapnia: Secondary | ICD-10-CM | POA: Diagnosis not present

## 2015-05-01 DIAGNOSIS — J9620 Acute and chronic respiratory failure, unspecified whether with hypoxia or hypercapnia: Secondary | ICD-10-CM | POA: Diagnosis not present

## 2015-05-02 ENCOUNTER — Telehealth: Payer: Self-pay | Admitting: Internal Medicine

## 2015-05-02 DIAGNOSIS — J9620 Acute and chronic respiratory failure, unspecified whether with hypoxia or hypercapnia: Secondary | ICD-10-CM | POA: Diagnosis not present

## 2015-05-02 DIAGNOSIS — J9612 Chronic respiratory failure with hypercapnia: Secondary | ICD-10-CM

## 2015-05-02 NOTE — Telephone Encounter (Signed)
Patient Name: Aritza Lupercio  DOB: Sep 08, 1932    Initial Comment Caller states she has a cough and a tickle in her throat.   Nurse Assessment  Nurse: Verlin Fester RN, [facility] Kidney Date/Time Eilene Ghazi Time): 05/02/2015 12:12:35 PM  Confirm and document reason for call. If symptomatic, describe symptoms. ---Patient states she has a dry cough since last week.  Has the patient traveled out of the country within the last 30 days? ---No  Does the patient have any new or worsening symptoms? ---Yes  Will a triage be completed? ---Yes  Related visit to physician within the last 2 weeks? ---No  Does the PT have any chronic conditions? (i.e. diabetes, asthma, etc.) ---Yes  List chronic conditions. ---"thyroid, HTN: takes Lasix, Divan and Toprol for BP, uses oxygen at home"     Guidelines    Guideline Title Affirmed Question Affirmed Notes  Cough - Acute Non-Productive Cough (all triage questions negative)   Cough - Acute Non-Productive Cough (all triage questions negative)    Final Disposition User   Gonzales, RN, [facility]        Disagree/Comply: Comply

## 2015-05-02 NOTE — Telephone Encounter (Signed)
[facility] PT line rings busy [facility]

## 2015-05-03 DIAGNOSIS — J9620 Acute and chronic respiratory failure, unspecified whether with hypoxia or hypercapnia: Secondary | ICD-10-CM | POA: Diagnosis not present

## 2015-05-04 DIAGNOSIS — J9620 Acute and chronic respiratory failure, unspecified whether with hypoxia or hypercapnia: Secondary | ICD-10-CM | POA: Diagnosis not present

## 2015-05-05 DIAGNOSIS — J9620 Acute and chronic respiratory failure, unspecified whether with hypoxia or hypercapnia: Secondary | ICD-10-CM | POA: Diagnosis not present

## 2015-05-05 NOTE — Telephone Encounter (Signed)
Spoke with pt. She is currently using oxygen with exertion. Would like a more portable tank. Order will be sent to [facility]. Nothing further was needed.

## 2015-05-06 DIAGNOSIS — J9620 Acute and chronic respiratory failure, unspecified whether with hypoxia or hypercapnia: Secondary | ICD-10-CM | POA: Diagnosis not present

## 2015-05-07 DIAGNOSIS — J9620 Acute and chronic respiratory failure, unspecified whether with hypoxia or hypercapnia: Secondary | ICD-10-CM | POA: Diagnosis not present

## 2015-05-08 DIAGNOSIS — J9620 Acute and chronic respiratory failure, unspecified whether with hypoxia or hypercapnia: Secondary | ICD-10-CM | POA: Diagnosis not present

## 2015-05-09 DIAGNOSIS — J9620 Acute and chronic respiratory failure, unspecified whether with hypoxia or hypercapnia: Secondary | ICD-10-CM | POA: Diagnosis not present

## 2015-05-10 DIAGNOSIS — J9620 Acute and chronic respiratory failure, unspecified whether with hypoxia or hypercapnia: Secondary | ICD-10-CM | POA: Diagnosis not present

## 2015-05-11 DIAGNOSIS — J9620 Acute and chronic respiratory failure, unspecified whether with hypoxia or hypercapnia: Secondary | ICD-10-CM | POA: Diagnosis not present

## 2015-05-12 DIAGNOSIS — M5416 Radiculopathy, lumbar region: Secondary | ICD-10-CM | POA: Diagnosis not present

## 2015-05-12 DIAGNOSIS — J9620 Acute and chronic respiratory failure, unspecified whether with hypoxia or hypercapnia: Secondary | ICD-10-CM | POA: Diagnosis not present

## 2015-05-12 DIAGNOSIS — M6281 Muscle weakness (generalized): Secondary | ICD-10-CM | POA: Diagnosis not present

## 2015-05-12 DIAGNOSIS — G8929 Other chronic pain: Secondary | ICD-10-CM | POA: Diagnosis not present

## 2015-05-12 DIAGNOSIS — J9610 Chronic respiratory failure, unspecified whether with hypoxia or hypercapnia: Secondary | ICD-10-CM | POA: Diagnosis not present

## 2015-05-13 DIAGNOSIS — J9620 Acute and chronic respiratory failure, unspecified whether with hypoxia or hypercapnia: Secondary | ICD-10-CM | POA: Diagnosis not present

## 2015-05-14 DIAGNOSIS — J9620 Acute and chronic respiratory failure, unspecified whether with hypoxia or hypercapnia: Secondary | ICD-10-CM | POA: Diagnosis not present

## 2015-05-15 DIAGNOSIS — J9620 Acute and chronic respiratory failure, unspecified whether with hypoxia or hypercapnia: Secondary | ICD-10-CM | POA: Diagnosis not present

## 2015-05-16 ENCOUNTER — Telehealth: Payer: Self-pay | Admitting: Internal Medicine

## 2015-05-16 DIAGNOSIS — J9612 Chronic respiratory failure with hypercapnia: Secondary | ICD-10-CM

## 2015-05-16 DIAGNOSIS — J9620 Acute and chronic respiratory failure, unspecified whether with hypoxia or hypercapnia: Secondary | ICD-10-CM | POA: Diagnosis not present

## 2015-05-16 NOTE — Telephone Encounter (Signed)
Spoke with pt. States that the order we placed last week for portable oxygen tank was incorrect. H[facility] Romans brought her large tanks that she has to push around. She needs something that she can carry. A new order has been placed for [employer]. Nothing further was needed.

## 2015-05-17 DIAGNOSIS — J9610 Chronic respiratory failure, unspecified whether with hypoxia or hypercapnia: Secondary | ICD-10-CM | POA: Diagnosis not present

## 2015-05-17 DIAGNOSIS — J449 Chronic obstructive pulmonary disease, unspecified: Secondary | ICD-10-CM | POA: Diagnosis not present

## 2015-05-17 DIAGNOSIS — J9620 Acute and chronic respiratory failure, unspecified whether with hypoxia or hypercapnia: Secondary | ICD-10-CM | POA: Diagnosis not present

## 2015-05-18 DIAGNOSIS — J9620 Acute and chronic respiratory failure, unspecified whether with hypoxia or hypercapnia: Secondary | ICD-10-CM | POA: Diagnosis not present

## 2015-05-19 DIAGNOSIS — J9620 Acute and chronic respiratory failure, unspecified whether with hypoxia or hypercapnia: Secondary | ICD-10-CM | POA: Diagnosis not present

## 2015-05-20 DIAGNOSIS — J9620 Acute and chronic respiratory failure, unspecified whether with hypoxia or hypercapnia: Secondary | ICD-10-CM | POA: Diagnosis not present

## 2015-05-21 DIAGNOSIS — J9620 Acute and chronic respiratory failure, unspecified whether with hypoxia or hypercapnia: Secondary | ICD-10-CM | POA: Diagnosis not present

## 2015-05-22 DIAGNOSIS — J9620 Acute and chronic respiratory failure, unspecified whether with hypoxia or hypercapnia: Secondary | ICD-10-CM | POA: Diagnosis not present

## 2015-05-23 DIAGNOSIS — J9620 Acute and chronic respiratory failure, unspecified whether with hypoxia or hypercapnia: Secondary | ICD-10-CM | POA: Diagnosis not present

## 2015-05-24 DIAGNOSIS — J9620 Acute and chronic respiratory failure, unspecified whether with hypoxia or hypercapnia: Secondary | ICD-10-CM | POA: Diagnosis not present

## 2015-05-25 DIAGNOSIS — E669 Obesity, unspecified: Secondary | ICD-10-CM | POA: Diagnosis not present

## 2015-05-26 DIAGNOSIS — E669 Obesity, unspecified: Secondary | ICD-10-CM | POA: Diagnosis not present

## 2015-05-27 DIAGNOSIS — E669 Obesity, unspecified: Secondary | ICD-10-CM | POA: Diagnosis not present

## 2015-05-28 DIAGNOSIS — E669 Obesity, unspecified: Secondary | ICD-10-CM | POA: Diagnosis not present

## 2015-05-30 DIAGNOSIS — E669 Obesity, unspecified: Secondary | ICD-10-CM | POA: Diagnosis not present

## 2015-05-31 ENCOUNTER — Other Ambulatory Visit: Payer: Self-pay | Admitting: Internal Medicine

## 2015-05-31 DIAGNOSIS — E669 Obesity, unspecified: Secondary | ICD-10-CM | POA: Diagnosis not present

## 2015-06-01 DIAGNOSIS — E669 Obesity, unspecified: Secondary | ICD-10-CM | POA: Diagnosis not present

## 2015-06-02 DIAGNOSIS — E669 Obesity, unspecified: Secondary | ICD-10-CM | POA: Diagnosis not present

## 2015-06-03 DIAGNOSIS — E669 Obesity, unspecified: Secondary | ICD-10-CM | POA: Diagnosis not present

## 2015-06-04 ENCOUNTER — Ambulatory Visit (INDEPENDENT_AMBULATORY_CARE_PROVIDER_SITE_OTHER): Payer: Commercial Managed Care - HMO | Admitting: Internal Medicine

## 2015-06-04 ENCOUNTER — Encounter: Payer: Self-pay | Admitting: Internal Medicine

## 2015-06-04 ENCOUNTER — Other Ambulatory Visit (INDEPENDENT_AMBULATORY_CARE_PROVIDER_SITE_OTHER): Payer: Commercial Managed Care - HMO

## 2015-06-04 VITALS — BP 124/60 | HR 64 | Temp 98.5°F | Resp 20 | Ht 62.0 in | Wt 251.8 lb

## 2015-06-04 DIAGNOSIS — Z23 Encounter for immunization: Secondary | ICD-10-CM | POA: Diagnosis not present

## 2015-06-04 DIAGNOSIS — E890 Postprocedural hypothyroidism: Secondary | ICD-10-CM

## 2015-06-04 DIAGNOSIS — E669 Obesity, unspecified: Secondary | ICD-10-CM | POA: Diagnosis not present

## 2015-06-04 DIAGNOSIS — I10 Essential (primary) hypertension: Secondary | ICD-10-CM

## 2015-06-04 DIAGNOSIS — R058 Other specified cough: Secondary | ICD-10-CM

## 2015-06-04 DIAGNOSIS — R05 Cough: Secondary | ICD-10-CM

## 2015-06-04 DIAGNOSIS — R600 Localized edema: Secondary | ICD-10-CM

## 2015-06-04 LAB — CBC WITH DIFFERENTIAL/PLATELET
Basophils Absolute: 0 10*3/uL (ref 0.0–0.1)
Basophils Relative: 0.6 % (ref 0.0–3.0)
Eosinophils Absolute: 0.3 10*3/uL (ref 0.0–0.7)
Eosinophils Relative: 4.4 % (ref 0.0–5.0)
HEMATOCRIT: 33.4 % — AB (ref 36.0–46.0)
HEMOGLOBIN: 10.9 g/dL — AB (ref 12.0–15.0)
LYMPHS PCT: 28.6 % (ref 12.0–46.0)
Lymphs Abs: 1.8 10*3/uL (ref 0.7–4.0)
MCHC: 32.6 g/dL (ref 30.0–36.0)
MCV: 88.9 fl (ref 78.0–100.0)
MONOS PCT: 7.6 % (ref 3.0–12.0)
Monocytes Absolute: 0.5 10*3/uL (ref 0.1–1.0)
Neutro Abs: 3.7 10*3/uL (ref 1.4–7.7)
Neutrophils Relative %: 58.8 % (ref 43.0–77.0)
Platelets: 166 10*3/uL (ref 150.0–400.0)
RBC: 3.75 Mil/uL — AB (ref 3.87–5.11)
RDW: 14.5 % (ref 11.5–15.5)
WBC: 6.4 10*3/uL (ref 4.0–10.5)

## 2015-06-04 LAB — COMPREHENSIVE METABOLIC PANEL
ALK PHOS: 104 U/L (ref 39–117)
ALT: 9 U/L (ref 0–35)
AST: 15 U/L (ref 0–37)
Albumin: 3.9 g/dL (ref 3.5–5.2)
BUN: 31 mg/dL — ABNORMAL HIGH (ref 6–23)
CALCIUM: 9.3 mg/dL (ref 8.4–10.5)
CHLORIDE: 101 meq/L (ref 96–112)
CO2: 33 mEq/L — ABNORMAL HIGH (ref 19–32)
CREATININE: 1.33 mg/dL — AB (ref 0.40–1.20)
GFR: 49.31 mL/min — ABNORMAL LOW (ref >60.00)
Glucose, Bld: 96 mg/dL (ref 70–99)
Potassium: 4.4 mEq/L (ref 3.5–5.1)
Sodium: 142 mEq/L (ref 135–145)
TOTAL PROTEIN: 7.6 g/dL (ref 6.0–8.3)
Total Bilirubin: 0.8 mg/dL (ref 0.2–1.2)

## 2015-06-04 LAB — TSH: TSH: 0.32 u[IU]/mL — AB (ref 0.35–4.50)

## 2015-06-04 MED ORDER — TRAMADOL HCL 50 MG PO TABS: 50.0000 mg | ORAL_TABLET | Freq: Three times a day (TID) | ORAL | Status: DC | PRN

## 2015-06-04 NOTE — Patient Instructions (Addendum)
  We have reviewed your prior records including labs and tests today.  Test(s) ordered today. Your results will be released to [facility] (or called to you) after review, usually within 72hours after test completion. If any changes need to be made, you will be notified at that same time.  All other Health Maintenance issues reviewed.   All recommended immunizations and age-appropriate screenings are up-to-date.  Flu vaccine administered today.   Medications reviewed and updated.  No changes recommended at this time.  Your prescription(s) have been submitted to your pharmacy. Please take as directed and contact our office if you believe you are having problem(s) with the medication(s).   Please schedule followup in 6 months

## 2015-06-04 NOTE — Assessment & Plan Note (Signed)
Check tsh today and will adjust med if needed

## 2015-06-04 NOTE — Assessment & Plan Note (Signed)
Controlled.  Continue current medications.

## 2015-06-04 NOTE — Progress Notes (Signed)
Subjective:    Patient ID: Shawna Fuentes, female    DOB: 22-Feb-1933, 81 y.o.   MRN: [id number]  HPI She is here to establish with a new pcp. She has no major concerns.   Dry cough:  She has a dry cough that is worse when she lays down.  It started three weeks ago.  It is getting a little better.  She denies sob, wheezing, fever.  She does have a runny nose.  She has been drinking water warm with lemon and using cough drops.    Goiter, hypothyroidism:  She had a large goiter and had it removed 12/2013. It was a major surgery and took her months to recover.  She is still not where she was before the surgery, but is significantly better.   She is taking her medication daily.   Since the surgery she has needed the oxygen.  She had CO2 retention secondary to the large thyroid.  She is also on CPAP for chronic respiratory failure.  She follows with dr. Annamaria Boots.   She has a HHA that helps with her bathing.  She uses a walker to ambulate.    Osteoarthritis:  She uses tramadol only as needed for arthritis.    Medications and allergies reviewed with patient and updated if appropriate.  Patient Active Problem List   Diagnosis Date Noted  . Obstructive sleep apnea 09/15/2014  . Osteoporosis 07/15/2014  . Arthritis pain of hand 07/10/2014  . Anemia, iron deficiency 07/10/2014  . Hypocalcemia 04/09/2014  . Deep vein thrombosis ([place]) 12/24/2013  . Chronic respiratory failure with hypercapnia ([place]) 12/21/2013  . Post-surgical hypothyroidism 12/21/2013  . Hypoxemia 12/20/2013  . Edema 12/12/2013  . Fracture, subtrochanteric, right femur, closed ([place]) 09/22/2013  . Chronic radicular low back pain 12/30/2012  . Osteoarthritis of right knee 12/30/2012  . Hyperlipidemia   . Hypertension     Current Outpatient Prescriptions on File Prior to Visit  Medication Sig Dispense Refill  . aspirin 81 MG tablet Take 1 tablet (81 mg total) by mouth daily. 30 tablet   . atorvastatin (LIPITOR) 10 MG tablet  Take 1 tablet (10 mg total) by mouth every evening. 90 tablet 3  . calcitRIOL (ROCALTROL) 0.25 MCG capsule TAKE ONE CAPSULE BY MOUTH TWICE A DAY 180 capsule 0  . calcium carbonate (OS-CAL) 600 MG TABS tablet Take 1 tablet (600 mg total) by mouth 2 (two) times daily with a meal. 60 tablet 5  . famotidine (PEPCID) 10 MG tablet Take 1 tablet (10 mg total) by mouth 2 (two) times daily.    . furosemide (LASIX) 40 MG tablet Take 1 tablet (40 mg total) by mouth daily. 90 tablet 3  . HYDROcodone-acetaminophen (NORCO/VICODIN) 5-325 MG per tablet Take 1 tablet by mouth every 6 (six) hours as needed for moderate pain.    Marland Kitchen levothyroxine (SYNTHROID, LEVOTHROID) 150 MCG tablet Take 1 tablet (150 mcg total) by mouth daily before breakfast. 90 tablet 1  . metoprolol tartrate (LOPRESSOR) 25 MG tablet Take 0.5 tablets (12.5 mg total) by mouth 2 (two) times daily. 90 tablet 1  . nystatin (MYCOSTATIN/NYSTOP) 100000 UNIT/GM POWD Apply to affected area 3 times a day 60 g 0  . sulindac (CLINORIL) 200 MG tablet Take 1 tablet (200 mg total) by mouth 2 (two) times daily. 180 tablet 1  . traMADol (ULTRAM) 50 MG tablet Take 50 mg by mouth every 8 (eight) hours as needed.    . valsartan (DIOVAN) 160 MG tablet Take  1 tablet (160 mg total) by mouth daily. 90 tablet 3  . Vitamin D, Ergocalciferol, (DRISDOL) 50000 UNITS CAPS capsule Take 50,000 Units by mouth.     No current facility-administered medications on file prior to visit.    Past Medical History  Diagnosis Date  . Arthritis   . Hyperlipidemia   . Hypertension   . History of kidney stones   . Hypothyroidism (acquired) 12/2013    s/p thyroidectomy 12/21/13 at [facility]  . Thyroid goiter     s/p thyroidectomy - pathology demonstrated nodular hyperplasia  . DVT (deep venous thrombosis) ([place]) 12/2013    bilateral upper extremity DVT found at time of thyroidectomy   . Osteoporosis 07/15/2014    DEXA @ [facility] 07/14/14: -3.1    Past Surgical History  Procedure Laterality  Date  . Hip surgery    . Femur im nail Right 09/22/2013    Procedure: INTRAMEDULLARY (IM) NAIL FEMORAL;  Surgeon: Wylene Simmer, MD;  Location: [place];  Service: Orthopedics;  Laterality: Right;  . Thyroidectomy with sternotomy  12/21/13    thyroid mass found incidentally on CT scan 12/2013; transferred to [facility] for surgical removal of thyroid and goiter requiring sternotomy    Social History   Social History  . Marital Status: Widowed    Spouse Name: N/A  . Number of Children: N/A  . Years of Education: N/A   Occupational History  . retired    Social History Main Topics  . Smoking status: Never Smoker   . Smokeless tobacco: None     Comment: lives w/ dtr dorthea, 2nd dtr and 3 sons in town  . Alcohol Use: No  . Drug Use: No  . Sexual Activity: Not Asked   Other Topics Concern  . 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   Lives with daughter dorthea - has 3 sons and another daughter in town, supportive    Review of Systems  Constitutional: Negative for fever, chills and appetite change.  Respiratory: Positive for cough (dry). Negative for shortness of breath and wheezing.   Cardiovascular: Negative for chest pain, palpitations and leg swelling.  Gastrointestinal: Negative for nausea, abdominal pain, diarrhea and constipation.       Occ GERD  Musculoskeletal: Positive for arthralgias (right hip (s/p fracture and ORIF), knee pain).       Occ cramping  Neurological: Negative for dizziness, light-headedness, numbness and headaches.  Psychiatric/Behavioral: Negative for dysphoric mood. The patient is not nervous/anxious.        Objective:   Filed Vitals:   06/04/15 1038  BP: 124/60  Pulse: 64  Temp: 98.5 F (36.9 C)  Resp: 20   Filed Weights   06/04/15 1038  Weight: 251 lb 12 oz (114.193 kg)   Body mass index is 46.03 kg/(m^2).   Physical Exam Constitutional: Appears  well-developed and well-nourished. No distress.  Neck: Neck supple. No tracheal deviation present. No palpable thyroid - surgical scar extending down chest.  No carotid bruit. No cervical adenopathy.   Cardiovascular: Normal rate, regular rhythm and normal heart sounds.   No murmur heard. Pulmonary/Chest: Effort normal and breath sounds normal. No respiratory distress. No wheezes.  Musculoskeletal: 1 + pitting b/l LE edema.          Assessment & Plan:   Dry cough Improving with conservative measures No sob, wheeze or fever or other cold symptoms Lung exam is normal  No further eval or treatment  See Problem List.  Follow up in 6 months, sooner if needed

## 2015-06-04 NOTE — Assessment & Plan Note (Signed)
Lasix daily controls edema Continue current dose of 40 mg daily

## 2015-06-04 NOTE — Progress Notes (Signed)
Pre visit review using our clinic review tool, if applicable. No additional management support is needed unless otherwise documented below in the visit note. 

## 2015-06-05 DIAGNOSIS — J9612 Chronic respiratory failure with hypercapnia: Secondary | ICD-10-CM | POA: Diagnosis not present

## 2015-06-05 DIAGNOSIS — J449 Chronic obstructive pulmonary disease, unspecified: Secondary | ICD-10-CM | POA: Diagnosis not present

## 2015-06-05 DIAGNOSIS — E669 Obesity, unspecified: Secondary | ICD-10-CM | POA: Diagnosis not present

## 2015-06-05 DIAGNOSIS — J9610 Chronic respiratory failure, unspecified whether with hypoxia or hypercapnia: Secondary | ICD-10-CM | POA: Diagnosis not present

## 2015-06-05 DIAGNOSIS — I10 Essential (primary) hypertension: Secondary | ICD-10-CM | POA: Diagnosis not present

## 2015-06-06 DIAGNOSIS — E669 Obesity, unspecified: Secondary | ICD-10-CM | POA: Diagnosis not present

## 2015-06-07 DIAGNOSIS — E669 Obesity, unspecified: Secondary | ICD-10-CM | POA: Diagnosis not present

## 2015-06-08 DIAGNOSIS — E669 Obesity, unspecified: Secondary | ICD-10-CM | POA: Diagnosis not present

## 2015-06-09 DIAGNOSIS — E669 Obesity, unspecified: Secondary | ICD-10-CM | POA: Diagnosis not present

## 2015-06-10 DIAGNOSIS — E669 Obesity, unspecified: Secondary | ICD-10-CM | POA: Diagnosis not present

## 2015-06-10 MED ORDER — LEVOTHYROXINE SODIUM 137 MCG PO TABS: 137.0000 ug | ORAL_TABLET | Freq: Every day | ORAL | Status: DC

## 2015-06-10 NOTE — Addendum Note (Signed)
Addended by: Binnie Rail on: 06/10/2015 09:16 PM   Modules accepted: Orders

## 2015-06-11 ENCOUNTER — Other Ambulatory Visit: Payer: Self-pay | Admitting: Internal Medicine

## 2015-06-11 DIAGNOSIS — M6281 Muscle weakness (generalized): Secondary | ICD-10-CM | POA: Diagnosis not present

## 2015-06-11 DIAGNOSIS — J9610 Chronic respiratory failure, unspecified whether with hypoxia or hypercapnia: Secondary | ICD-10-CM | POA: Diagnosis not present

## 2015-06-11 DIAGNOSIS — E669 Obesity, unspecified: Secondary | ICD-10-CM | POA: Diagnosis not present

## 2015-06-11 DIAGNOSIS — M5416 Radiculopathy, lumbar region: Secondary | ICD-10-CM | POA: Diagnosis not present

## 2015-06-11 DIAGNOSIS — G8929 Other chronic pain: Secondary | ICD-10-CM | POA: Diagnosis not present

## 2015-06-12 DIAGNOSIS — E669 Obesity, unspecified: Secondary | ICD-10-CM | POA: Diagnosis not present

## 2015-06-13 DIAGNOSIS — E669 Obesity, unspecified: Secondary | ICD-10-CM | POA: Diagnosis not present

## 2015-06-14 DIAGNOSIS — E669 Obesity, unspecified: Secondary | ICD-10-CM | POA: Diagnosis not present

## 2015-06-15 DIAGNOSIS — E669 Obesity, unspecified: Secondary | ICD-10-CM | POA: Diagnosis not present

## 2015-06-16 DIAGNOSIS — J449 Chronic obstructive pulmonary disease, unspecified: Secondary | ICD-10-CM | POA: Diagnosis not present

## 2015-06-16 DIAGNOSIS — E669 Obesity, unspecified: Secondary | ICD-10-CM | POA: Diagnosis not present

## 2015-06-16 DIAGNOSIS — J9610 Chronic respiratory failure, unspecified whether with hypoxia or hypercapnia: Secondary | ICD-10-CM | POA: Diagnosis not present

## 2015-06-17 DIAGNOSIS — E669 Obesity, unspecified: Secondary | ICD-10-CM | POA: Diagnosis not present

## 2015-06-18 DIAGNOSIS — E669 Obesity, unspecified: Secondary | ICD-10-CM | POA: Diagnosis not present

## 2015-06-19 DIAGNOSIS — E669 Obesity, unspecified: Secondary | ICD-10-CM | POA: Diagnosis not present

## 2015-06-20 DIAGNOSIS — E669 Obesity, unspecified: Secondary | ICD-10-CM | POA: Diagnosis not present

## 2015-06-21 DIAGNOSIS — E669 Obesity, unspecified: Secondary | ICD-10-CM | POA: Diagnosis not present

## 2015-06-22 DIAGNOSIS — E669 Obesity, unspecified: Secondary | ICD-10-CM | POA: Diagnosis not present

## 2015-06-23 ENCOUNTER — Other Ambulatory Visit: Payer: Self-pay | Admitting: Internal Medicine

## 2015-06-23 DIAGNOSIS — E669 Obesity, unspecified: Secondary | ICD-10-CM | POA: Diagnosis not present

## 2015-06-24 DIAGNOSIS — E669 Obesity, unspecified: Secondary | ICD-10-CM | POA: Diagnosis not present

## 2015-06-25 DIAGNOSIS — E669 Obesity, unspecified: Secondary | ICD-10-CM | POA: Diagnosis not present

## 2015-06-26 DIAGNOSIS — E669 Obesity, unspecified: Secondary | ICD-10-CM | POA: Diagnosis not present

## 2015-06-27 DIAGNOSIS — E669 Obesity, unspecified: Secondary | ICD-10-CM | POA: Diagnosis not present

## 2015-06-28 DIAGNOSIS — E669 Obesity, unspecified: Secondary | ICD-10-CM | POA: Diagnosis not present

## 2015-06-30 DIAGNOSIS — E669 Obesity, unspecified: Secondary | ICD-10-CM | POA: Diagnosis not present

## 2015-07-01 DIAGNOSIS — E669 Obesity, unspecified: Secondary | ICD-10-CM | POA: Diagnosis not present

## 2015-07-02 DIAGNOSIS — E669 Obesity, unspecified: Secondary | ICD-10-CM | POA: Diagnosis not present

## 2015-07-03 DIAGNOSIS — E669 Obesity, unspecified: Secondary | ICD-10-CM | POA: Diagnosis not present

## 2015-07-04 DIAGNOSIS — E669 Obesity, unspecified: Secondary | ICD-10-CM | POA: Diagnosis not present

## 2015-07-05 DIAGNOSIS — E669 Obesity, unspecified: Secondary | ICD-10-CM | POA: Diagnosis not present

## 2015-07-06 DIAGNOSIS — J9612 Chronic respiratory failure with hypercapnia: Secondary | ICD-10-CM | POA: Diagnosis not present

## 2015-07-06 DIAGNOSIS — I10 Essential (primary) hypertension: Secondary | ICD-10-CM | POA: Diagnosis not present

## 2015-07-06 DIAGNOSIS — J449 Chronic obstructive pulmonary disease, unspecified: Secondary | ICD-10-CM | POA: Diagnosis not present

## 2015-07-06 DIAGNOSIS — J9610 Chronic respiratory failure, unspecified whether with hypoxia or hypercapnia: Secondary | ICD-10-CM | POA: Diagnosis not present

## 2015-07-07 DIAGNOSIS — E669 Obesity, unspecified: Secondary | ICD-10-CM | POA: Diagnosis not present

## 2015-07-08 DIAGNOSIS — E669 Obesity, unspecified: Secondary | ICD-10-CM | POA: Diagnosis not present

## 2015-07-09 DIAGNOSIS — E669 Obesity, unspecified: Secondary | ICD-10-CM | POA: Diagnosis not present

## 2015-07-10 DIAGNOSIS — E669 Obesity, unspecified: Secondary | ICD-10-CM | POA: Diagnosis not present

## 2015-07-11 DIAGNOSIS — E669 Obesity, unspecified: Secondary | ICD-10-CM | POA: Diagnosis not present

## 2015-07-12 DIAGNOSIS — J9610 Chronic respiratory failure, unspecified whether with hypoxia or hypercapnia: Secondary | ICD-10-CM | POA: Diagnosis not present

## 2015-07-12 DIAGNOSIS — M5416 Radiculopathy, lumbar region: Secondary | ICD-10-CM | POA: Diagnosis not present

## 2015-07-12 DIAGNOSIS — M6281 Muscle weakness (generalized): Secondary | ICD-10-CM | POA: Diagnosis not present

## 2015-07-12 DIAGNOSIS — G8929 Other chronic pain: Secondary | ICD-10-CM | POA: Diagnosis not present

## 2015-07-14 DIAGNOSIS — E669 Obesity, unspecified: Secondary | ICD-10-CM | POA: Diagnosis not present

## 2015-07-15 DIAGNOSIS — E669 Obesity, unspecified: Secondary | ICD-10-CM | POA: Diagnosis not present

## 2015-07-16 DIAGNOSIS — E669 Obesity, unspecified: Secondary | ICD-10-CM | POA: Diagnosis not present

## 2015-07-17 DIAGNOSIS — J449 Chronic obstructive pulmonary disease, unspecified: Secondary | ICD-10-CM | POA: Diagnosis not present

## 2015-07-17 DIAGNOSIS — J9610 Chronic respiratory failure, unspecified whether with hypoxia or hypercapnia: Secondary | ICD-10-CM | POA: Diagnosis not present

## 2015-07-17 DIAGNOSIS — E669 Obesity, unspecified: Secondary | ICD-10-CM | POA: Diagnosis not present

## 2015-07-18 DIAGNOSIS — E669 Obesity, unspecified: Secondary | ICD-10-CM | POA: Diagnosis not present

## 2015-07-19 DIAGNOSIS — E669 Obesity, unspecified: Secondary | ICD-10-CM | POA: Diagnosis not present

## 2015-07-20 DIAGNOSIS — E669 Obesity, unspecified: Secondary | ICD-10-CM | POA: Diagnosis not present

## 2015-07-22 DIAGNOSIS — E669 Obesity, unspecified: Secondary | ICD-10-CM | POA: Diagnosis not present

## 2015-07-23 DIAGNOSIS — E669 Obesity, unspecified: Secondary | ICD-10-CM | POA: Diagnosis not present

## 2015-07-24 ENCOUNTER — Ambulatory Visit: Payer: Commercial Managed Care - HMO | Admitting: Internal Medicine

## 2015-07-24 DIAGNOSIS — E669 Obesity, unspecified: Secondary | ICD-10-CM | POA: Diagnosis not present

## 2015-07-25 DIAGNOSIS — E669 Obesity, unspecified: Secondary | ICD-10-CM | POA: Diagnosis not present

## 2015-07-26 ENCOUNTER — Other Ambulatory Visit: Payer: Self-pay | Admitting: Internal Medicine

## 2015-07-26 DIAGNOSIS — E669 Obesity, unspecified: Secondary | ICD-10-CM | POA: Diagnosis not present

## 2015-07-27 DIAGNOSIS — E669 Obesity, unspecified: Secondary | ICD-10-CM | POA: Diagnosis not present

## 2015-07-28 DIAGNOSIS — E669 Obesity, unspecified: Secondary | ICD-10-CM | POA: Diagnosis not present

## 2015-07-29 DIAGNOSIS — E669 Obesity, unspecified: Secondary | ICD-10-CM | POA: Diagnosis not present

## 2015-07-30 DIAGNOSIS — E669 Obesity, unspecified: Secondary | ICD-10-CM | POA: Diagnosis not present

## 2015-07-31 DIAGNOSIS — E669 Obesity, unspecified: Secondary | ICD-10-CM | POA: Diagnosis not present

## 2015-08-01 DIAGNOSIS — E669 Obesity, unspecified: Secondary | ICD-10-CM | POA: Diagnosis not present

## 2015-08-02 DIAGNOSIS — E669 Obesity, unspecified: Secondary | ICD-10-CM | POA: Diagnosis not present

## 2015-08-03 DIAGNOSIS — E669 Obesity, unspecified: Secondary | ICD-10-CM | POA: Diagnosis not present

## 2015-08-04 DIAGNOSIS — E669 Obesity, unspecified: Secondary | ICD-10-CM | POA: Diagnosis not present

## 2015-08-05 ENCOUNTER — Telehealth: Payer: Self-pay | Admitting: Internal Medicine

## 2015-08-05 ENCOUNTER — Other Ambulatory Visit (INDEPENDENT_AMBULATORY_CARE_PROVIDER_SITE_OTHER): Payer: Commercial Managed Care - HMO

## 2015-08-05 DIAGNOSIS — E890 Postprocedural hypothyroidism: Secondary | ICD-10-CM

## 2015-08-05 DIAGNOSIS — E669 Obesity, unspecified: Secondary | ICD-10-CM | POA: Diagnosis not present

## 2015-08-05 DIAGNOSIS — I10 Essential (primary) hypertension: Secondary | ICD-10-CM

## 2015-08-05 LAB — COMPREHENSIVE METABOLIC PANEL
ALBUMIN: 4.1 g/dL (ref 3.5–5.2)
ALT: 10 U/L (ref 0–35)
AST: 15 U/L (ref 0–37)
Alkaline Phosphatase: 100 U/L (ref 39–117)
BUN: 23 mg/dL (ref 6–23)
CHLORIDE: 100 meq/L (ref 96–112)
CO2: 33 meq/L — AB (ref 19–32)
Calcium: 9.6 mg/dL (ref 8.4–10.5)
Creatinine, Ser: 1.14 mg/dL (ref 0.40–1.20)
GFR: 58.89 mL/min — ABNORMAL LOW (ref >60.00)
Glucose, Bld: 95 mg/dL (ref 70–99)
Potassium: 4.2 mEq/L (ref 3.5–5.1)
Sodium: 142 mEq/L (ref 135–145)
Total Bilirubin: 0.8 mg/dL (ref 0.2–1.2)
Total Protein: 7.9 g/dL (ref 6.0–8.3)

## 2015-08-05 LAB — TSH: TSH: 0.93 u[IU]/mL (ref 0.35–4.50)

## 2015-08-05 NOTE — Telephone Encounter (Signed)
Attempted to get download off triology machine but card is not working. Pt reports every night she wears the machine and the cold air makes her cough and feels like she has a tickle in back of throat.  Called [facility] and they can work pt in to get download off machine today between 10-11:30. Went out and spoke with pt and made aware. She will call them in a few minutes.  Pt has appt 08/07/15 for appt with Dr. Annamaria Boots to discuss issues then.

## 2015-08-06 DIAGNOSIS — J449 Chronic obstructive pulmonary disease, unspecified: Secondary | ICD-10-CM | POA: Diagnosis not present

## 2015-08-06 DIAGNOSIS — E669 Obesity, unspecified: Secondary | ICD-10-CM | POA: Diagnosis not present

## 2015-08-06 DIAGNOSIS — I10 Essential (primary) hypertension: Secondary | ICD-10-CM | POA: Diagnosis not present

## 2015-08-06 DIAGNOSIS — J9612 Chronic respiratory failure with hypercapnia: Secondary | ICD-10-CM | POA: Diagnosis not present

## 2015-08-06 DIAGNOSIS — J9610 Chronic respiratory failure, unspecified whether with hypoxia or hypercapnia: Secondary | ICD-10-CM | POA: Diagnosis not present

## 2015-08-07 ENCOUNTER — Ambulatory Visit (INDEPENDENT_AMBULATORY_CARE_PROVIDER_SITE_OTHER): Payer: Commercial Managed Care - HMO | Admitting: Internal Medicine

## 2015-08-07 ENCOUNTER — Ambulatory Visit (HOSPITAL_COMMUNITY)
Admission: RE | Admit: 2015-08-07 | Discharge: 2015-08-07 | Disposition: A | Discharge status: Home / Self Care | Payer: Commercial Managed Care - HMO | Source: Ambulatory Visit | Attending: Internal Medicine | Admitting: Internal Medicine

## 2015-08-07 ENCOUNTER — Encounter: Payer: Self-pay | Admitting: Internal Medicine

## 2015-08-07 VITALS — BP 122/70 | HR 70 | Ht 62.0 in | Wt 253.2 lb

## 2015-08-07 DIAGNOSIS — J9612 Chronic respiratory failure with hypercapnia: Secondary | ICD-10-CM | POA: Diagnosis not present

## 2015-08-07 DIAGNOSIS — I82409 Acute embolism and thrombosis of unspecified deep veins of unspecified lower extremity: Secondary | ICD-10-CM

## 2015-08-07 DIAGNOSIS — E669 Obesity, unspecified: Secondary | ICD-10-CM | POA: Diagnosis not present

## 2015-08-07 LAB — BLOOD GAS, ARTERIAL
Acid-Base Excess: 4.8 mmol/L — ABNORMAL HIGH (ref 0.0–2.0)
BICARBONATE: 29.9 meq/L — AB (ref 20.0–24.0)
Drawn by: 295031
FIO2: 0.21
O2 Saturation: 92.1 %
PCO2 ART: 49 mmHg — AB (ref 35.0–45.0)
PH ART: 7.402 (ref 7.350–7.450)
PO2 ART: 67.5 mmHg — AB (ref 80.0–100.0)
Patient temperature: 98.6
TCO2: 27.3 mmol/L (ref 0–100)

## 2015-08-07 NOTE — Assessment & Plan Note (Signed)
She remains overweight and is encouraged to avoid sitting still for long periods to reduce stasis.

## 2015-08-07 NOTE — Progress Notes (Signed)
07/09/14 -81 yoF never smoker Referred by Dr Asa Lente for sleep medicine consultation. Current CPAP. Wears O2 2L [facility] during day and bled through CPAP( auto 4-14) at night. Epworth Score: 4     son here-she lives with him Hx hip fx 09/2013, goiter surgery complicated by CO2 retention 12/27/13 @ [facility], DVT bilateral UE(? PE). Now off warfarin. Becoming more ambulatory but mostly wheelchair bound for several years by obesity and osteoarthritis. Apparently goiter was very large, constricting airway and contributing to hypoxia and CO2 retention. Son reports little snoring and she denies daytime sleepiness, with no history of obstructive sleep apnea documented. CPAP is for chronic respiratory failure. She is hoping that she can come off of oxygen and CPAP. She denies past known lung disease.  09/12/14- 59 yo F never smoker followed for OSA, complicated by DVT, obesity, had hypercapneic resp failure at time of goiter surgery   Husband here CPAP auto 4-14, continuous O2 2L/ [facility] Can sit on room air for 5 minutes but needs 2 L oxygen for any exertion. No cough or phlegm. Strangles easily drinking thin liquids. ABG on 2 L oxygen 07/09/2014-pH 7.36, PCO2 52.8, PO2 101, HCO3 29.6 CXR 07/09/14 IMPRESSION: 1. Bibasilar subsegmental atelectasis with findings suggesting right base pneumonia. 2. Stable cardiomegaly. Prior median sternotomy . Interim resolution of previously identified large paratracheal mass/goiter. 3. Severe degenerative changes both shoulders with changes suggesting avascular necrosis both humeral heads. Electronically Signed  By: Marcello Moores Register  On: 07/09/2014 14:50  01/21/15- 76 yo F never smoker followed for OSA, complicated by DVT, obesity, had hypercapneic resp failure at time of goiter surgery   Husband here FOLLOWS [id number] O2 2L/ [facility] all the time.Sob-same,no wheezing or coughing. CPAPauto 4-14/ [facility] doing well,pressure good.wears 7-8 hrs. each night. She has no complaints. We  reviewed CXR image and discussed her chronic R diaphragm elevation. CXR 09/12/14 IMPRESSION: 1. Chronic elevation of the right hemidiaphragm. No active lung disease. 2. Stable cardiomegaly. Electronically Signed  By: Ivar Drape M.D.  On: 09/12/2014 14:55  08/07/2015-82 year old female never smoker followed for OSA, Dated by DVT, obesity, had hypercapnic respiratory failure at time of goiter surgery O2 2L/ [facility]- continuous  POC Trilogy/ [facility] NIV mode AVAP-AE  AVAPS rate 3, Vt 330, PS max 30, min 8, EPAP max 15/ min 4, ramp off,  Download- 83% of days, avg 3 hr 10 min FOLLOWS FOR: DME is [facility]; Wears O2 all the time however not wearing CPAP-hard time with throat tickles and machine. Would like to know about coming off the of the CPAP.  Compliance download shows that she keeps trying her Trilogy device but tends to take it off after a couple of hours each night. Wearing it she develops a throat tickle cough which she does not experience breathing room air. Walk test on room air-08/07/15-held saturation 97% on room air, resting saturation on room air 97%. Walked slowly with a walker 185 feet. ABG on room air 08/07/2015-pH 7.40, PCO2 49, PO2 67.5, HCO3 29.9, saturation 92.1%.  ROS-see HPI Constitutional:   No-   weight loss, night sweats, fevers, chills, fatigue, lassitude. HEENT:   No-  headaches, difficulty swallowing, tooth/dental problems, sore throat,       No-  sneezing, itching, ear ache, nasal congestion, post nasal drip,  CV:  No-   chest pain, orthopnea, PND, swelling in lower extremities, anasarca,  dizziness, palpitations Resp: +shortness of breath with exertion or at rest.              productive cough,  No non-productive cough,  No- coughing up of blood.              No-   change in color of mucus.  No- wheezing.   Skin: No-   rash or lesions. GI:  + heartburn, No- indigestion, abdominal pain, nausea, vomiting,  GU: . MS:   No-   joint pain or swelling.   Neuro-     nothing unusual Psych:  No- change in mood or affect. No depression or anxiety.  No memory loss.  OBJ- Physical Exam  + obesity, +walker, O2 L General- Alert, Oriented, Affect-appropriate, Distress- none acute Skin- rash-none, lesions- none, excoriation- none Lymphadenopathy- none Head- atraumatic            Eyes- Gross vision intact, PERRLA, conjunctivae and secretions clear            Ears- Hearing, canals-normal            Nose- Clear, no-Septal dev, mucus, polyps, erosion, perforation             Throat- Mallampati III-IV , mucosa clear , drainage- none, tonsils- atrophic, + dentures Neck- flexible , trachea midline, no stridor , thyroid nl, carotid no bruit Chest - symmetrical excursion , unlabored           Heart/CV- RRR , no murmur , no gallop  , no rub, nl s1 s2                           - JVD- none , edema- none, stasis changes- none, varices- none           Lung- clear, unlabored at rest, +too heavy to percuss dullness, wheeze- none, cough- none , dullness-none, rub- none           Chest wall-  Abd-  Br/ Gen/ Rectal- Not done, not indicated Extrem- cyanosis- none, clubbing, none, atrophy- none, strength- nl, + heavy without definite edema,  Neuro- grossly intact to observation

## 2015-08-07 NOTE — Addendum Note (Signed)
Addended by: Lorane Gell on: 08/07/2015 01:45 PM   Modules accepted: Orders

## 2015-08-07 NOTE — Patient Instructions (Addendum)
Order- ABG on 2l  O2      Dx chronic hypercapneic respiratory failure  Order- DME [facility]   Please add humidifier to home O2 concentrator  Rest and walk test on room air   [place] on room air

## 2015-08-07 NOTE — Assessment & Plan Note (Addendum)
Mild hypoxemia and CO2 retention but she probably no longer needs her Trilogy machine or supplemental oxygen. This will be a significant relief for her although she is trying hard to make them work. We will recheck oxygenation while asleep before removing equipment. Plan-overnight oximetry on room air

## 2015-08-08 ENCOUNTER — Telehealth: Payer: Self-pay | Admitting: *Deleted

## 2015-08-08 DIAGNOSIS — G4733 Obstructive sleep apnea (adult) (pediatric): Secondary | ICD-10-CM

## 2015-08-08 DIAGNOSIS — E669 Obesity, unspecified: Secondary | ICD-10-CM | POA: Diagnosis not present

## 2015-08-08 NOTE — Telephone Encounter (Signed)
ONO ordered.  Patient and patient's son aware.  See LAB results.

## 2015-08-08 NOTE — Telephone Encounter (Signed)
-----   Message from Deneise Lever, MD sent at 08/07/2015  1:56 PM EST ----- Arterial Blood Gas test- oxygen level is a little low on room air, and there is a little carbon dioxide retention, but neither is bad. We will check oxygen saturation at night on room air. Hopefully we will be able to stop the Trilogy machine and home oxygen.

## 2015-08-09 DIAGNOSIS — E669 Obesity, unspecified: Secondary | ICD-10-CM | POA: Diagnosis not present

## 2015-08-12 DIAGNOSIS — G8929 Other chronic pain: Secondary | ICD-10-CM | POA: Diagnosis not present

## 2015-08-12 DIAGNOSIS — M5416 Radiculopathy, lumbar region: Secondary | ICD-10-CM | POA: Diagnosis not present

## 2015-08-12 DIAGNOSIS — J9610 Chronic respiratory failure, unspecified whether with hypoxia or hypercapnia: Secondary | ICD-10-CM | POA: Diagnosis not present

## 2015-08-12 DIAGNOSIS — E669 Obesity, unspecified: Secondary | ICD-10-CM | POA: Diagnosis not present

## 2015-08-12 DIAGNOSIS — M6281 Muscle weakness (generalized): Secondary | ICD-10-CM | POA: Diagnosis not present

## 2015-08-13 DIAGNOSIS — E669 Obesity, unspecified: Secondary | ICD-10-CM | POA: Diagnosis not present

## 2015-08-14 DIAGNOSIS — E669 Obesity, unspecified: Secondary | ICD-10-CM | POA: Diagnosis not present

## 2015-08-15 DIAGNOSIS — E669 Obesity, unspecified: Secondary | ICD-10-CM | POA: Diagnosis not present

## 2015-08-16 DIAGNOSIS — E669 Obesity, unspecified: Secondary | ICD-10-CM | POA: Diagnosis not present

## 2015-08-17 DIAGNOSIS — J9610 Chronic respiratory failure, unspecified whether with hypoxia or hypercapnia: Secondary | ICD-10-CM | POA: Diagnosis not present

## 2015-08-17 DIAGNOSIS — J449 Chronic obstructive pulmonary disease, unspecified: Secondary | ICD-10-CM | POA: Diagnosis not present

## 2015-08-17 DIAGNOSIS — E669 Obesity, unspecified: Secondary | ICD-10-CM | POA: Diagnosis not present

## 2015-08-18 DIAGNOSIS — E669 Obesity, unspecified: Secondary | ICD-10-CM | POA: Diagnosis not present

## 2015-08-19 DIAGNOSIS — E669 Obesity, unspecified: Secondary | ICD-10-CM | POA: Diagnosis not present

## 2015-08-20 ENCOUNTER — Telehealth: Payer: Self-pay | Admitting: Internal Medicine

## 2015-08-20 DIAGNOSIS — E669 Obesity, unspecified: Secondary | ICD-10-CM | POA: Diagnosis not present

## 2015-08-20 NOTE — Telephone Encounter (Signed)
Pt is aware of results and will continue O2 use as she has been doing. Nothing more needed at this time.

## 2015-08-21 DIAGNOSIS — E669 Obesity, unspecified: Secondary | ICD-10-CM | POA: Diagnosis not present

## 2015-08-22 DIAGNOSIS — E669 Obesity, unspecified: Secondary | ICD-10-CM | POA: Diagnosis not present

## 2015-08-23 DIAGNOSIS — E669 Obesity, unspecified: Secondary | ICD-10-CM | POA: Diagnosis not present

## 2015-08-24 DIAGNOSIS — E669 Obesity, unspecified: Secondary | ICD-10-CM | POA: Diagnosis not present

## 2015-08-25 DIAGNOSIS — E669 Obesity, unspecified: Secondary | ICD-10-CM | POA: Diagnosis not present

## 2015-08-26 DIAGNOSIS — E669 Obesity, unspecified: Secondary | ICD-10-CM | POA: Diagnosis not present

## 2015-08-27 DIAGNOSIS — E669 Obesity, unspecified: Secondary | ICD-10-CM | POA: Diagnosis not present

## 2015-08-28 DIAGNOSIS — E669 Obesity, unspecified: Secondary | ICD-10-CM | POA: Diagnosis not present

## 2015-08-29 ENCOUNTER — Encounter: Payer: Self-pay | Admitting: Internal Medicine

## 2015-08-29 DIAGNOSIS — E669 Obesity, unspecified: Secondary | ICD-10-CM | POA: Diagnosis not present

## 2015-08-30 DIAGNOSIS — E669 Obesity, unspecified: Secondary | ICD-10-CM | POA: Diagnosis not present

## 2015-09-03 DIAGNOSIS — J9612 Chronic respiratory failure with hypercapnia: Secondary | ICD-10-CM | POA: Diagnosis not present

## 2015-09-03 DIAGNOSIS — I10 Essential (primary) hypertension: Secondary | ICD-10-CM | POA: Diagnosis not present

## 2015-09-03 DIAGNOSIS — J9610 Chronic respiratory failure, unspecified whether with hypoxia or hypercapnia: Secondary | ICD-10-CM | POA: Diagnosis not present

## 2015-09-03 DIAGNOSIS — J449 Chronic obstructive pulmonary disease, unspecified: Secondary | ICD-10-CM | POA: Diagnosis not present

## 2015-09-07 DIAGNOSIS — E669 Obesity, unspecified: Secondary | ICD-10-CM | POA: Diagnosis not present

## 2015-09-08 DIAGNOSIS — E669 Obesity, unspecified: Secondary | ICD-10-CM | POA: Diagnosis not present

## 2015-09-09 DIAGNOSIS — M6281 Muscle weakness (generalized): Secondary | ICD-10-CM | POA: Diagnosis not present

## 2015-09-09 DIAGNOSIS — M5416 Radiculopathy, lumbar region: Secondary | ICD-10-CM | POA: Diagnosis not present

## 2015-09-09 DIAGNOSIS — G8929 Other chronic pain: Secondary | ICD-10-CM | POA: Diagnosis not present

## 2015-09-09 DIAGNOSIS — J9610 Chronic respiratory failure, unspecified whether with hypoxia or hypercapnia: Secondary | ICD-10-CM | POA: Diagnosis not present

## 2015-09-09 DIAGNOSIS — E669 Obesity, unspecified: Secondary | ICD-10-CM | POA: Diagnosis not present

## 2015-09-10 DIAGNOSIS — E669 Obesity, unspecified: Secondary | ICD-10-CM | POA: Diagnosis not present

## 2015-09-11 DIAGNOSIS — E669 Obesity, unspecified: Secondary | ICD-10-CM | POA: Diagnosis not present

## 2015-09-12 ENCOUNTER — Other Ambulatory Visit: Payer: Self-pay | Admitting: Internal Medicine

## 2015-09-12 DIAGNOSIS — E669 Obesity, unspecified: Secondary | ICD-10-CM | POA: Diagnosis not present

## 2015-09-12 MED ORDER — CALCITRIOL 0.25 MCG PO CAPS: 0.2500 ug | ORAL_CAPSULE | Freq: Two times a day (BID) | ORAL | Status: DC

## 2015-09-12 NOTE — Telephone Encounter (Signed)
Patient is also requesting rocaltrol to be sent as well

## 2015-09-12 NOTE — Telephone Encounter (Signed)
Please advise last OV 06/04/2015, last Refill sent 03/17/15

## 2015-09-12 NOTE — Telephone Encounter (Signed)
Ok to fill 

## 2015-09-13 DIAGNOSIS — E669 Obesity, unspecified: Secondary | ICD-10-CM | POA: Diagnosis not present

## 2015-09-14 DIAGNOSIS — J449 Chronic obstructive pulmonary disease, unspecified: Secondary | ICD-10-CM | POA: Diagnosis not present

## 2015-09-14 DIAGNOSIS — J9610 Chronic respiratory failure, unspecified whether with hypoxia or hypercapnia: Secondary | ICD-10-CM | POA: Diagnosis not present

## 2015-09-14 DIAGNOSIS — E669 Obesity, unspecified: Secondary | ICD-10-CM | POA: Diagnosis not present

## 2015-09-15 DIAGNOSIS — E669 Obesity, unspecified: Secondary | ICD-10-CM | POA: Diagnosis not present

## 2015-09-18 DIAGNOSIS — E669 Obesity, unspecified: Secondary | ICD-10-CM | POA: Diagnosis not present

## 2015-09-19 DIAGNOSIS — E669 Obesity, unspecified: Secondary | ICD-10-CM | POA: Diagnosis not present

## 2015-09-20 DIAGNOSIS — E669 Obesity, unspecified: Secondary | ICD-10-CM | POA: Diagnosis not present

## 2015-09-21 DIAGNOSIS — E669 Obesity, unspecified: Secondary | ICD-10-CM | POA: Diagnosis not present

## 2015-09-22 DIAGNOSIS — E669 Obesity, unspecified: Secondary | ICD-10-CM | POA: Diagnosis not present

## 2015-09-23 DIAGNOSIS — E669 Obesity, unspecified: Secondary | ICD-10-CM | POA: Diagnosis not present

## 2015-09-24 ENCOUNTER — Other Ambulatory Visit: Payer: Self-pay | Admitting: Internal Medicine

## 2015-09-24 DIAGNOSIS — E669 Obesity, unspecified: Secondary | ICD-10-CM | POA: Diagnosis not present

## 2015-09-25 DIAGNOSIS — E669 Obesity, unspecified: Secondary | ICD-10-CM | POA: Diagnosis not present

## 2015-09-26 DIAGNOSIS — E669 Obesity, unspecified: Secondary | ICD-10-CM | POA: Diagnosis not present

## 2015-09-27 DIAGNOSIS — E669 Obesity, unspecified: Secondary | ICD-10-CM | POA: Diagnosis not present

## 2015-09-28 DIAGNOSIS — E669 Obesity, unspecified: Secondary | ICD-10-CM | POA: Diagnosis not present

## 2015-09-29 DIAGNOSIS — E669 Obesity, unspecified: Secondary | ICD-10-CM | POA: Diagnosis not present

## 2015-09-30 DIAGNOSIS — E669 Obesity, unspecified: Secondary | ICD-10-CM | POA: Diagnosis not present

## 2015-10-02 DIAGNOSIS — E669 Obesity, unspecified: Secondary | ICD-10-CM | POA: Diagnosis not present

## 2015-10-03 DIAGNOSIS — E669 Obesity, unspecified: Secondary | ICD-10-CM | POA: Diagnosis not present

## 2015-10-04 DIAGNOSIS — I10 Essential (primary) hypertension: Secondary | ICD-10-CM | POA: Diagnosis not present

## 2015-10-04 DIAGNOSIS — J9612 Chronic respiratory failure with hypercapnia: Secondary | ICD-10-CM | POA: Diagnosis not present

## 2015-10-04 DIAGNOSIS — J9610 Chronic respiratory failure, unspecified whether with hypoxia or hypercapnia: Secondary | ICD-10-CM | POA: Diagnosis not present

## 2015-10-04 DIAGNOSIS — J449 Chronic obstructive pulmonary disease, unspecified: Secondary | ICD-10-CM | POA: Diagnosis not present

## 2015-10-04 DIAGNOSIS — E669 Obesity, unspecified: Secondary | ICD-10-CM | POA: Diagnosis not present

## 2015-10-05 DIAGNOSIS — E669 Obesity, unspecified: Secondary | ICD-10-CM | POA: Diagnosis not present

## 2015-10-06 DIAGNOSIS — E669 Obesity, unspecified: Secondary | ICD-10-CM | POA: Diagnosis not present

## 2015-10-07 DIAGNOSIS — E669 Obesity, unspecified: Secondary | ICD-10-CM | POA: Diagnosis not present

## 2015-10-08 DIAGNOSIS — E669 Obesity, unspecified: Secondary | ICD-10-CM | POA: Diagnosis not present

## 2015-10-09 DIAGNOSIS — E669 Obesity, unspecified: Secondary | ICD-10-CM | POA: Diagnosis not present

## 2015-10-10 DIAGNOSIS — M6281 Muscle weakness (generalized): Secondary | ICD-10-CM | POA: Diagnosis not present

## 2015-10-10 DIAGNOSIS — E669 Obesity, unspecified: Secondary | ICD-10-CM | POA: Diagnosis not present

## 2015-10-10 DIAGNOSIS — M5416 Radiculopathy, lumbar region: Secondary | ICD-10-CM | POA: Diagnosis not present

## 2015-10-10 DIAGNOSIS — G8929 Other chronic pain: Secondary | ICD-10-CM | POA: Diagnosis not present

## 2015-10-10 DIAGNOSIS — J9610 Chronic respiratory failure, unspecified whether with hypoxia or hypercapnia: Secondary | ICD-10-CM | POA: Diagnosis not present

## 2015-10-11 ENCOUNTER — Other Ambulatory Visit: Payer: Self-pay | Admitting: Internal Medicine

## 2015-10-11 DIAGNOSIS — E669 Obesity, unspecified: Secondary | ICD-10-CM | POA: Diagnosis not present

## 2015-10-12 DIAGNOSIS — E669 Obesity, unspecified: Secondary | ICD-10-CM | POA: Diagnosis not present

## 2015-10-13 DIAGNOSIS — E669 Obesity, unspecified: Secondary | ICD-10-CM | POA: Diagnosis not present

## 2015-10-14 DIAGNOSIS — E669 Obesity, unspecified: Secondary | ICD-10-CM | POA: Diagnosis not present

## 2015-10-15 DIAGNOSIS — E669 Obesity, unspecified: Secondary | ICD-10-CM | POA: Diagnosis not present

## 2015-10-15 DIAGNOSIS — J449 Chronic obstructive pulmonary disease, unspecified: Secondary | ICD-10-CM | POA: Diagnosis not present

## 2015-10-15 DIAGNOSIS — J9610 Chronic respiratory failure, unspecified whether with hypoxia or hypercapnia: Secondary | ICD-10-CM | POA: Diagnosis not present

## 2015-10-16 DIAGNOSIS — E669 Obesity, unspecified: Secondary | ICD-10-CM | POA: Diagnosis not present

## 2015-10-17 DIAGNOSIS — E669 Obesity, unspecified: Secondary | ICD-10-CM | POA: Diagnosis not present

## 2015-10-18 DIAGNOSIS — E669 Obesity, unspecified: Secondary | ICD-10-CM | POA: Diagnosis not present

## 2015-10-18 IMAGING — CR DG CHEST 2V
2 series · 2 of 2 positions shown · non-contrast
Comparison: Chest x-ray of 07/09/2014

CLINICAL DATA: Chronic hypoxia, respiratory failure

EXAM:
CHEST  2 VIEW

[view not recorded (1 of 2)]
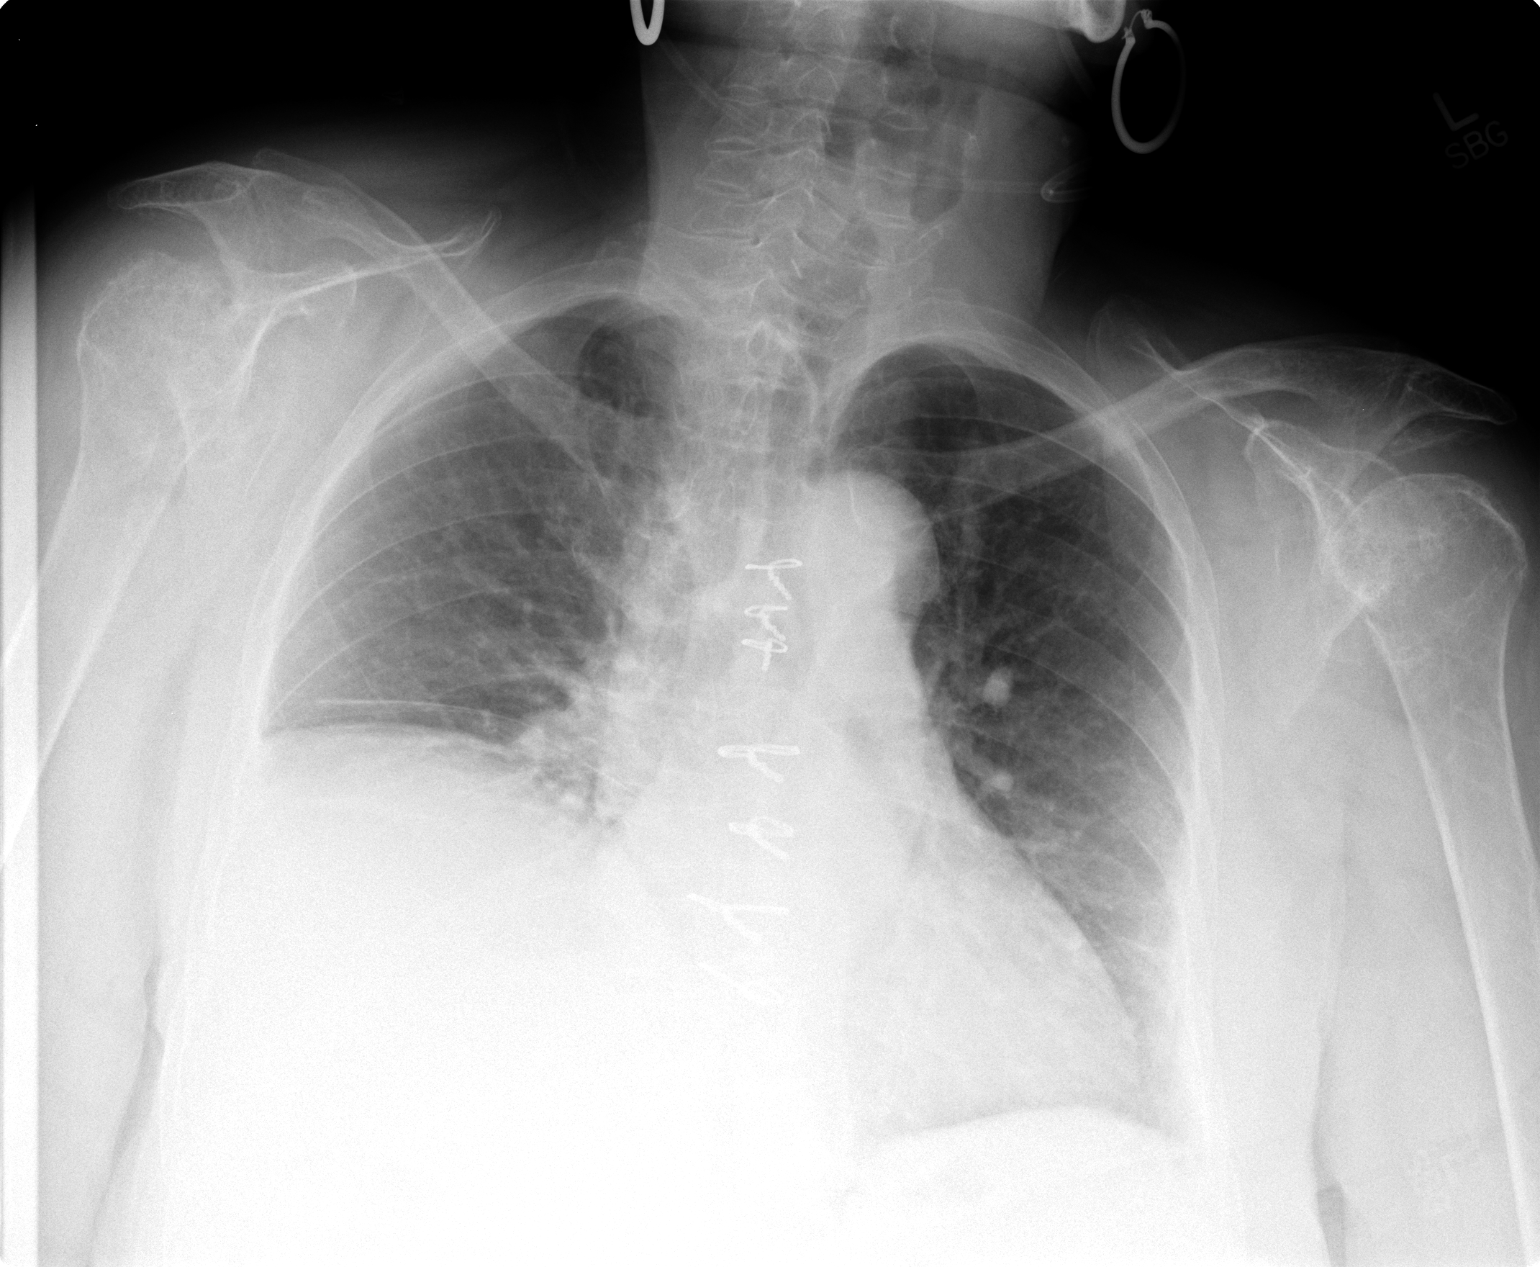

[view not recorded (2 of 2)]
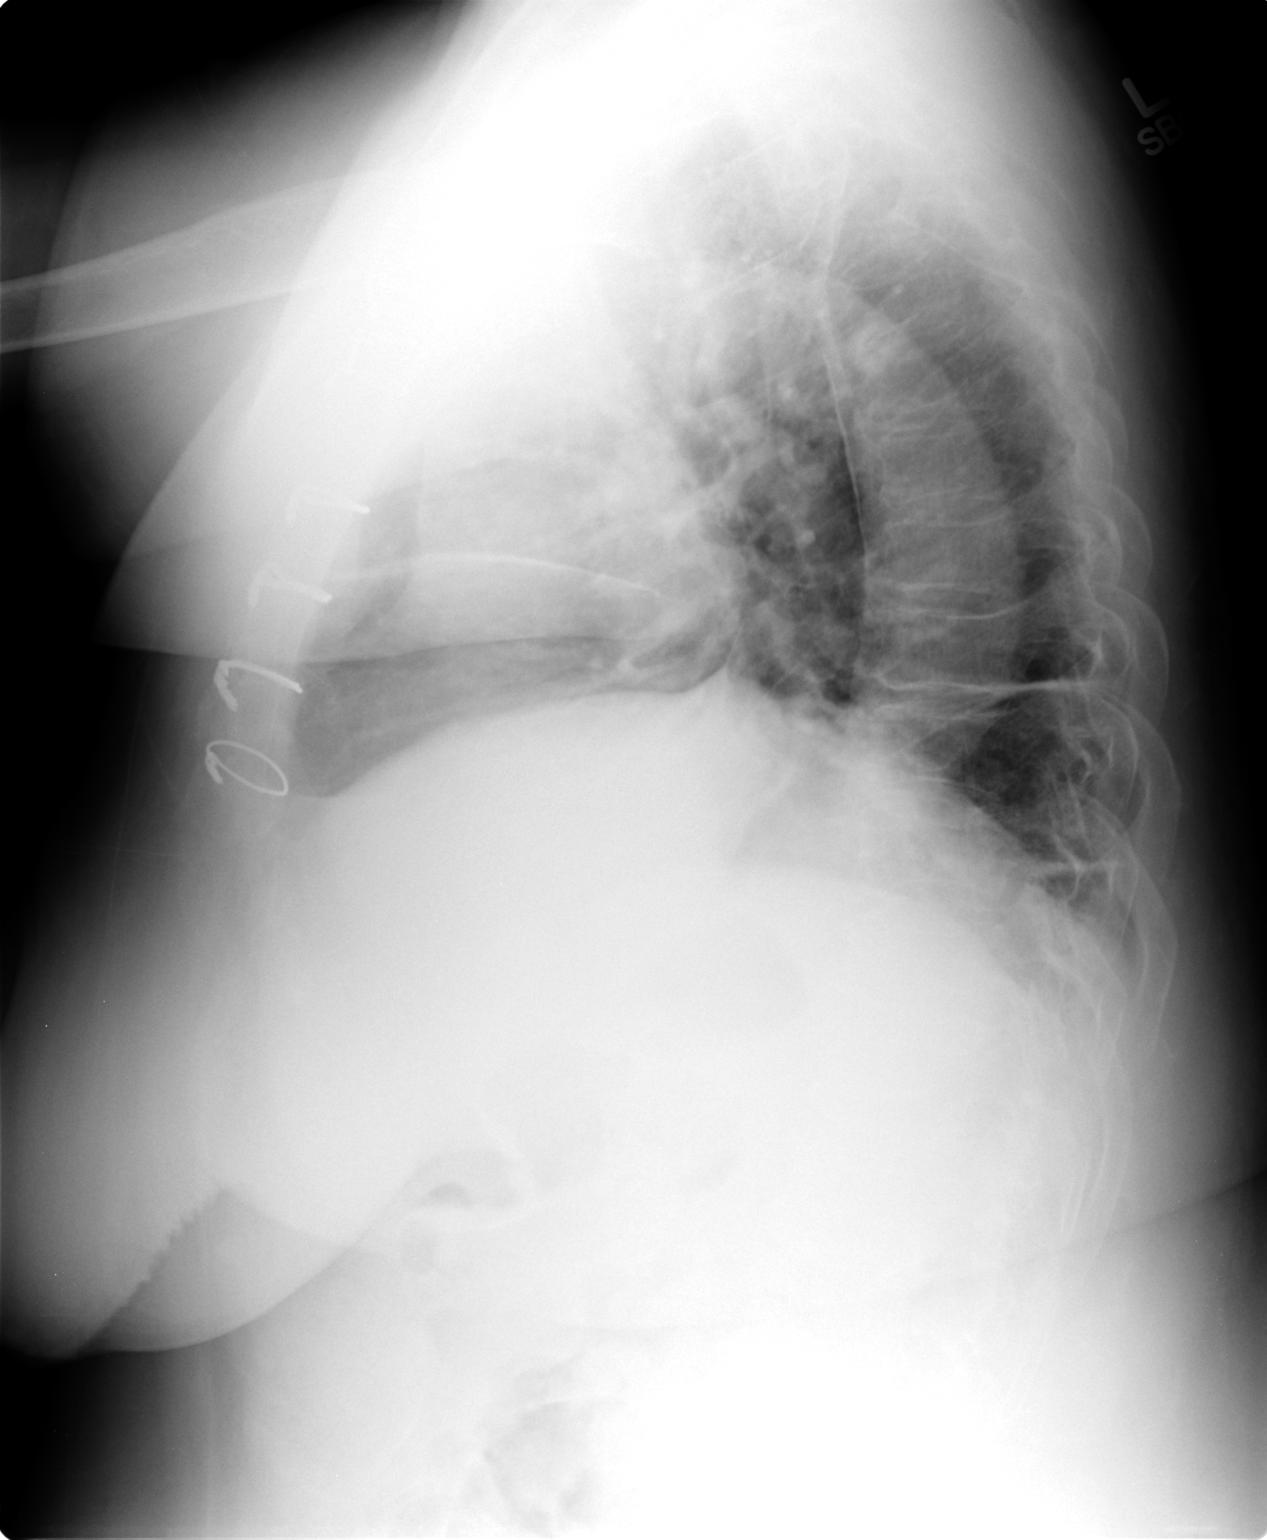

[2 of 2 positions shown; findings below may reference images not displayed]

FINDINGS: There is persistent elevation of the right hemidiaphragm with mild
right basilar atelectasis or scarring present. No active infiltrate
or effusion is seen. Mild cardiomegaly is stable. Median sternotomy
sutures are noted. Degenerative changes are present in both
shoulders.
IMPRESSION: 1. Chronic elevation of the right hemidiaphragm. No active lung
disease.
2. Stable cardiomegaly.

## 2015-10-19 DIAGNOSIS — E669 Obesity, unspecified: Secondary | ICD-10-CM | POA: Diagnosis not present

## 2015-10-20 DIAGNOSIS — E669 Obesity, unspecified: Secondary | ICD-10-CM | POA: Diagnosis not present

## 2015-10-21 DIAGNOSIS — E669 Obesity, unspecified: Secondary | ICD-10-CM | POA: Diagnosis not present

## 2015-10-22 DIAGNOSIS — E669 Obesity, unspecified: Secondary | ICD-10-CM | POA: Diagnosis not present

## 2015-10-23 DIAGNOSIS — E669 Obesity, unspecified: Secondary | ICD-10-CM | POA: Diagnosis not present

## 2015-10-24 DIAGNOSIS — E669 Obesity, unspecified: Secondary | ICD-10-CM | POA: Diagnosis not present

## 2015-10-25 DIAGNOSIS — E669 Obesity, unspecified: Secondary | ICD-10-CM | POA: Diagnosis not present

## 2015-10-26 DIAGNOSIS — E669 Obesity, unspecified: Secondary | ICD-10-CM | POA: Diagnosis not present

## 2015-10-27 DIAGNOSIS — E669 Obesity, unspecified: Secondary | ICD-10-CM | POA: Diagnosis not present

## 2015-10-28 DIAGNOSIS — E669 Obesity, unspecified: Secondary | ICD-10-CM | POA: Diagnosis not present

## 2015-10-29 DIAGNOSIS — E669 Obesity, unspecified: Secondary | ICD-10-CM | POA: Diagnosis not present

## 2015-10-30 DIAGNOSIS — E669 Obesity, unspecified: Secondary | ICD-10-CM | POA: Diagnosis not present

## 2015-10-31 DIAGNOSIS — E669 Obesity, unspecified: Secondary | ICD-10-CM | POA: Diagnosis not present

## 2015-11-01 DIAGNOSIS — E669 Obesity, unspecified: Secondary | ICD-10-CM | POA: Diagnosis not present

## 2015-11-02 DIAGNOSIS — E669 Obesity, unspecified: Secondary | ICD-10-CM | POA: Diagnosis not present

## 2015-11-03 DIAGNOSIS — J9612 Chronic respiratory failure with hypercapnia: Secondary | ICD-10-CM | POA: Diagnosis not present

## 2015-11-03 DIAGNOSIS — J9610 Chronic respiratory failure, unspecified whether with hypoxia or hypercapnia: Secondary | ICD-10-CM | POA: Diagnosis not present

## 2015-11-03 DIAGNOSIS — J449 Chronic obstructive pulmonary disease, unspecified: Secondary | ICD-10-CM | POA: Diagnosis not present

## 2015-11-03 DIAGNOSIS — I10 Essential (primary) hypertension: Secondary | ICD-10-CM | POA: Diagnosis not present

## 2015-11-03 DIAGNOSIS — E669 Obesity, unspecified: Secondary | ICD-10-CM | POA: Diagnosis not present

## 2015-11-04 DIAGNOSIS — E669 Obesity, unspecified: Secondary | ICD-10-CM | POA: Diagnosis not present

## 2015-11-05 DIAGNOSIS — E669 Obesity, unspecified: Secondary | ICD-10-CM | POA: Diagnosis not present

## 2015-11-06 DIAGNOSIS — E669 Obesity, unspecified: Secondary | ICD-10-CM | POA: Diagnosis not present

## 2015-11-07 DIAGNOSIS — E669 Obesity, unspecified: Secondary | ICD-10-CM | POA: Diagnosis not present

## 2015-11-08 DIAGNOSIS — E669 Obesity, unspecified: Secondary | ICD-10-CM | POA: Diagnosis not present

## 2015-11-09 DIAGNOSIS — M5416 Radiculopathy, lumbar region: Secondary | ICD-10-CM | POA: Diagnosis not present

## 2015-11-09 DIAGNOSIS — J9610 Chronic respiratory failure, unspecified whether with hypoxia or hypercapnia: Secondary | ICD-10-CM | POA: Diagnosis not present

## 2015-11-09 DIAGNOSIS — G8929 Other chronic pain: Secondary | ICD-10-CM | POA: Diagnosis not present

## 2015-11-09 DIAGNOSIS — M6281 Muscle weakness (generalized): Secondary | ICD-10-CM | POA: Diagnosis not present

## 2015-11-09 DIAGNOSIS — E669 Obesity, unspecified: Secondary | ICD-10-CM | POA: Diagnosis not present

## 2015-11-10 DIAGNOSIS — E669 Obesity, unspecified: Secondary | ICD-10-CM | POA: Diagnosis not present

## 2015-11-11 DIAGNOSIS — E669 Obesity, unspecified: Secondary | ICD-10-CM | POA: Diagnosis not present

## 2015-11-12 DIAGNOSIS — E669 Obesity, unspecified: Secondary | ICD-10-CM | POA: Diagnosis not present

## 2015-11-13 DIAGNOSIS — E669 Obesity, unspecified: Secondary | ICD-10-CM | POA: Diagnosis not present

## 2015-11-14 DIAGNOSIS — E669 Obesity, unspecified: Secondary | ICD-10-CM | POA: Diagnosis not present

## 2015-11-14 DIAGNOSIS — J9610 Chronic respiratory failure, unspecified whether with hypoxia or hypercapnia: Secondary | ICD-10-CM | POA: Diagnosis not present

## 2015-11-14 DIAGNOSIS — J449 Chronic obstructive pulmonary disease, unspecified: Secondary | ICD-10-CM | POA: Diagnosis not present

## 2015-11-15 DIAGNOSIS — E669 Obesity, unspecified: Secondary | ICD-10-CM | POA: Diagnosis not present

## 2015-11-16 DIAGNOSIS — E669 Obesity, unspecified: Secondary | ICD-10-CM | POA: Diagnosis not present

## 2015-11-17 DIAGNOSIS — E669 Obesity, unspecified: Secondary | ICD-10-CM | POA: Diagnosis not present

## 2015-11-18 DIAGNOSIS — E669 Obesity, unspecified: Secondary | ICD-10-CM | POA: Diagnosis not present

## 2015-11-19 DIAGNOSIS — E669 Obesity, unspecified: Secondary | ICD-10-CM | POA: Diagnosis not present

## 2015-11-20 DIAGNOSIS — E669 Obesity, unspecified: Secondary | ICD-10-CM | POA: Diagnosis not present

## 2015-11-21 DIAGNOSIS — E669 Obesity, unspecified: Secondary | ICD-10-CM | POA: Diagnosis not present

## 2015-11-22 DIAGNOSIS — E669 Obesity, unspecified: Secondary | ICD-10-CM | POA: Diagnosis not present

## 2015-11-23 DIAGNOSIS — E669 Obesity, unspecified: Secondary | ICD-10-CM | POA: Diagnosis not present

## 2015-11-24 ENCOUNTER — Other Ambulatory Visit: Payer: Self-pay | Admitting: Internal Medicine

## 2015-11-24 DIAGNOSIS — E669 Obesity, unspecified: Secondary | ICD-10-CM | POA: Diagnosis not present

## 2015-11-25 DIAGNOSIS — E669 Obesity, unspecified: Secondary | ICD-10-CM | POA: Diagnosis not present

## 2015-11-26 DIAGNOSIS — E669 Obesity, unspecified: Secondary | ICD-10-CM | POA: Diagnosis not present

## 2015-11-27 DIAGNOSIS — E669 Obesity, unspecified: Secondary | ICD-10-CM | POA: Diagnosis not present

## 2015-11-28 DIAGNOSIS — E669 Obesity, unspecified: Secondary | ICD-10-CM | POA: Diagnosis not present

## 2015-11-29 DIAGNOSIS — E669 Obesity, unspecified: Secondary | ICD-10-CM | POA: Diagnosis not present

## 2015-11-30 DIAGNOSIS — E669 Obesity, unspecified: Secondary | ICD-10-CM | POA: Diagnosis not present

## 2015-12-01 DIAGNOSIS — E669 Obesity, unspecified: Secondary | ICD-10-CM | POA: Diagnosis not present

## 2015-12-02 ENCOUNTER — Ambulatory Visit: Payer: Commercial Managed Care - HMO | Admitting: Internal Medicine

## 2015-12-02 DIAGNOSIS — E669 Obesity, unspecified: Secondary | ICD-10-CM | POA: Diagnosis not present

## 2015-12-03 ENCOUNTER — Encounter: Payer: Commercial Managed Care - HMO | Admitting: Internal Medicine

## 2015-12-03 DIAGNOSIS — E669 Obesity, unspecified: Secondary | ICD-10-CM | POA: Diagnosis not present

## 2015-12-03 NOTE — Progress Notes (Signed)
Subjective:    Patient ID: Shawna Fuentes, female    DOB: 07/25/1932, 82 y.o.   MRN: [id number]  HPI error  Patient Active Problem List   Diagnosis Date Noted  . Obstructive sleep apnea 09/15/2014  . Osteoporosis 07/15/2014  . Arthritis pain of hand 07/10/2014  . Anemia, iron deficiency 07/10/2014  . Hypocalcemia 04/09/2014  . Deep vein thrombosis ([place]) 12/24/2013  . Chronic respiratory failure with hypercapnia ([place]) 12/21/2013  . Post-surgical hypothyroidism 12/21/2013  . Hypoxemia 12/20/2013  . Edema 12/12/2013  . Fracture, subtrochanteric, right femur, closed ([place]) 09/22/2013  . Chronic radicular low back pain 12/30/2012  . Osteoarthritis of right knee 12/30/2012  . Hyperlipidemia   . Hypertension     Current Outpatient Prescriptions on File Prior to Visit  Medication Sig Dispense Refill  . aspirin 81 MG tablet Take 1 tablet (81 mg total) by mouth daily. 30 tablet   . atorvastatin (LIPITOR) 10 MG tablet Take 1 tablet (10 mg total) by mouth every evening. 90 tablet 1  . calcitRIOL (ROCALTROL) 0.25 MCG capsule Take 1 capsule (0.25 mcg total) by mouth 2 (two) times daily. 180 capsule 0  . calcium carbonate (OS-CAL) 600 MG TABS tablet Take 1 tablet (600 mg total) by mouth 2 (two) times daily with a meal. 60 tablet 5  . famotidine (PEPCID) 10 MG tablet Take 1 tablet (10 mg total) by mouth 2 (two) times daily.    . furosemide (LASIX) 40 MG tablet TAKE 1 TABLET BY MOUTH EVERY DAY 90 tablet 1  . levothyroxine (SYNTHROID, LEVOTHROID) 137 MCG tablet Take 1 tablet (137 mcg total) by mouth daily before breakfast. 90 tablet 1  . metoprolol tartrate (LOPRESSOR) 25 MG tablet TAKE 1/2 TABLET BY MOUTH TWICE A DAY 90 tablet 1  . nystatin (MYCOSTATIN/NYSTOP) 100000 UNIT/GM POWD Apply to affected area 3 times a day 60 g 0  . sulindac (CLINORIL) 200 MG tablet TAKE 1 TABLET BY MOUTH TWICE A DAY 180 tablet 0  . traMADol (ULTRAM) 50 MG tablet Take 1 tablet (50 mg total) by mouth every 8 (eight)  hours as needed. 30 tablet 0  . valsartan (DIOVAN) 160 MG tablet TAKE 1 TABLET BY MOUTH EVERY DAY 90 tablet 2  . Vitamin D, Ergocalciferol, (DRISDOL) 50000 UNITS CAPS capsule Take 50,000 Units by mouth.     No current facility-administered medications on file prior to visit.    Past Medical History  Diagnosis Date  . Arthritis   . Hyperlipidemia   . Hypertension   . History of kidney stones   . Hypothyroidism (acquired) 12/2013    s/p thyroidectomy 12/21/13 at [facility]  . Thyroid goiter     s/p thyroidectomy - pathology demonstrated nodular hyperplasia  . DVT (deep venous thrombosis) ([place]) 12/2013    bilateral upper extremity DVT found at time of thyroidectomy   . Osteoporosis 07/15/2014    DEXA @ [facility] 07/14/14: -3.1    Past Surgical History  Procedure Laterality Date  . Hip surgery    . Femur im nail Right 09/22/2013    Procedure: INTRAMEDULLARY (IM) NAIL FEMORAL;  Surgeon: Wylene Simmer, MD;  Location: [place];  Service: Orthopedics;  Laterality: Right;  . Thyroidectomy with sternotomy  12/21/13    thyroid mass found incidentally on CT scan 12/2013; transferred to [facility] for surgical removal of thyroid and goiter requiring sternotomy    Social History   Social History  . Marital Status: Widowed    Spouse Name: N/A  . Number  of Children: N/A  . Years of Education: N/A   Occupational History  . retired    Social History Main Topics  . Smoking status: Never Smoker   . Smokeless tobacco: Not on file     Comment: lives w/ dtr dorthea, 2nd dtr and 3 sons in town  . Alcohol Use: No  . Drug Use: No  . Sexual Activity: Not on file   Other Topics Concern  . 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   Lives with daughter dorthea - has 3 sons and another daughter in town, supportive    Family History  Problem Relation Age of Onset  . Osteoarthritis Mother   . Hypertension Mother   .  Hypertension Father   . Diabetes Son   . Diabetes Son   . Cancer Father     unknown type  . Clotting disorder    . Rheumatologic disease      Review of Systems     Objective:  There were no vitals filed for this visit. There were no vitals filed for this visit. There is no weight on file to calculate BMI.   Physical Exam        Assessment & Plan:    This encounter was created in error - please disregard.

## 2015-12-04 DIAGNOSIS — J9612 Chronic respiratory failure with hypercapnia: Secondary | ICD-10-CM | POA: Diagnosis not present

## 2015-12-04 DIAGNOSIS — I10 Essential (primary) hypertension: Secondary | ICD-10-CM | POA: Diagnosis not present

## 2015-12-04 DIAGNOSIS — J449 Chronic obstructive pulmonary disease, unspecified: Secondary | ICD-10-CM | POA: Diagnosis not present

## 2015-12-04 DIAGNOSIS — J9610 Chronic respiratory failure, unspecified whether with hypoxia or hypercapnia: Secondary | ICD-10-CM | POA: Diagnosis not present

## 2015-12-04 DIAGNOSIS — E669 Obesity, unspecified: Secondary | ICD-10-CM | POA: Diagnosis not present

## 2015-12-05 ENCOUNTER — Other Ambulatory Visit (INDEPENDENT_AMBULATORY_CARE_PROVIDER_SITE_OTHER): Payer: Commercial Managed Care - HMO

## 2015-12-05 ENCOUNTER — Ambulatory Visit (INDEPENDENT_AMBULATORY_CARE_PROVIDER_SITE_OTHER): Payer: Commercial Managed Care - HMO | Admitting: Internal Medicine

## 2015-12-05 ENCOUNTER — Encounter: Payer: Self-pay | Admitting: Internal Medicine

## 2015-12-05 VITALS — BP 152/84 | HR 69 | Temp 98.3°F | Resp 18 | Wt 256.0 lb

## 2015-12-05 VITALS — BP 148/68 | HR 68 | Ht 62.0 in | Wt 256.0 lb

## 2015-12-05 DIAGNOSIS — M810 Age-related osteoporosis without current pathological fracture: Secondary | ICD-10-CM | POA: Diagnosis not present

## 2015-12-05 DIAGNOSIS — I10 Essential (primary) hypertension: Secondary | ICD-10-CM

## 2015-12-05 DIAGNOSIS — E785 Hyperlipidemia, unspecified: Secondary | ICD-10-CM

## 2015-12-05 DIAGNOSIS — E669 Obesity, unspecified: Secondary | ICD-10-CM | POA: Diagnosis not present

## 2015-12-05 DIAGNOSIS — R252 Cramp and spasm: Secondary | ICD-10-CM

## 2015-12-05 DIAGNOSIS — E890 Postprocedural hypothyroidism: Secondary | ICD-10-CM

## 2015-12-05 DIAGNOSIS — D509 Iron deficiency anemia, unspecified: Secondary | ICD-10-CM | POA: Diagnosis not present

## 2015-12-05 DIAGNOSIS — R600 Localized edema: Secondary | ICD-10-CM

## 2015-12-05 DIAGNOSIS — G4733 Obstructive sleep apnea (adult) (pediatric): Secondary | ICD-10-CM | POA: Diagnosis not present

## 2015-12-05 DIAGNOSIS — J9612 Chronic respiratory failure with hypercapnia: Secondary | ICD-10-CM

## 2015-12-05 DIAGNOSIS — M19049 Primary osteoarthritis, unspecified hand: Secondary | ICD-10-CM

## 2015-12-05 DIAGNOSIS — M1990 Unspecified osteoarthritis, unspecified site: Secondary | ICD-10-CM

## 2015-12-05 DIAGNOSIS — M79643 Pain in unspecified hand: Secondary | ICD-10-CM

## 2015-12-05 LAB — CBC WITH DIFFERENTIAL/PLATELET
BASOS PCT: 0.9 % (ref 0.0–3.0)
Basophils Absolute: 0.1 10*3/uL (ref 0.0–0.1)
EOS ABS: 0.3 10*3/uL (ref 0.0–0.7)
EOS PCT: 4.1 % (ref 0.0–5.0)
HCT: 38 % (ref 36.0–46.0)
HEMOGLOBIN: 12.4 g/dL (ref 12.0–15.0)
LYMPHS ABS: 2.1 10*3/uL (ref 0.7–4.0)
Lymphocytes Relative: 29.2 % (ref 12.0–46.0)
MCHC: 32.6 g/dL (ref 30.0–36.0)
MCV: 88.7 fl (ref 78.0–100.0)
MONO ABS: 0.4 10*3/uL (ref 0.1–1.0)
MONOS PCT: 5.3 % (ref 3.0–12.0)
NEUTROS ABS: 4.4 10*3/uL (ref 1.4–7.7)
NEUTROS PCT: 60.5 % (ref 43.0–77.0)
PLATELETS: 178 10*3/uL (ref 150.0–400.0)
RBC: 4.29 Mil/uL (ref 3.87–5.11)
RDW: 14.5 % (ref 11.5–15.5)
WBC: 7.3 10*3/uL (ref 4.0–10.5)

## 2015-12-05 LAB — COMPREHENSIVE METABOLIC PANEL
ALK PHOS: 113 U/L (ref 39–117)
ALT: 10 U/L (ref 0–35)
AST: 14 U/L (ref 0–37)
Albumin: 4.1 g/dL (ref 3.5–5.2)
BUN: 42 mg/dL — ABNORMAL HIGH (ref 6–23)
CHLORIDE: 104 meq/L (ref 96–112)
CO2: 30 meq/L (ref 19–32)
Calcium: 9.6 mg/dL (ref 8.4–10.5)
Creatinine, Ser: 1.47 mg/dL — ABNORMAL HIGH (ref 0.40–1.20)
GFR: 43.88 mL/min — AB (ref >60.00)
GLUCOSE: 95 mg/dL (ref 70–99)
POTASSIUM: 4.3 meq/L (ref 3.5–5.1)
Sodium: 144 mEq/L (ref 135–145)
Total Bilirubin: 0.8 mg/dL (ref 0.2–1.2)
Total Protein: 8 g/dL (ref 6.0–8.3)

## 2015-12-05 LAB — TSH: TSH: 0.37 u[IU]/mL (ref 0.35–4.50)

## 2015-12-05 LAB — FERRITIN: FERRITIN: 112.9 ng/mL (ref 10.0–291.0)

## 2015-12-05 LAB — MAGNESIUM: MAGNESIUM: 1.9 mg/dL (ref 1.5–2.5)

## 2015-12-05 LAB — IRON: Iron: 47 ug/dL (ref 42–145)

## 2015-12-05 MED ORDER — CALCIUM CARBONATE 600 MG PO TABS: 600.0000 mg | ORAL_TABLET | Freq: Two times a day (BID) | ORAL | Status: AC

## 2015-12-05 MED ORDER — ATORVASTATIN CALCIUM 10 MG PO TABS: 10.0000 mg | ORAL_TABLET | Freq: Every evening | ORAL | Status: DC

## 2015-12-05 MED ORDER — CALCITRIOL 0.25 MCG PO CAPS: 0.2500 ug | ORAL_CAPSULE | Freq: Two times a day (BID) | ORAL | Status: DC

## 2015-12-05 MED ORDER — SULINDAC 200 MG PO TABS: 200.0000 mg | ORAL_TABLET | Freq: Two times a day (BID) | ORAL | Status: DC

## 2015-12-05 MED ORDER — FUROSEMIDE 40 MG PO TABS: 40.0000 mg | ORAL_TABLET | Freq: Every day | ORAL | Status: DC

## 2015-12-05 MED ORDER — FAMOTIDINE 20 MG PO TABS: 20.0000 mg | ORAL_TABLET | Freq: Every day | ORAL | Status: DC

## 2015-12-05 MED ORDER — METOPROLOL TARTRATE 25 MG PO TABS: 12.5000 mg | ORAL_TABLET | Freq: Two times a day (BID) | ORAL | Status: DC

## 2015-12-05 MED ORDER — LEVOTHYROXINE SODIUM 137 MCG PO TABS: 137.0000 ug | ORAL_TABLET | Freq: Every day | ORAL | Status: DC

## 2015-12-05 NOTE — Patient Instructions (Addendum)

## 2015-12-05 NOTE — Progress Notes (Signed)
Pre visit review using our clinic review tool, if applicable. No additional management support is needed unless otherwise documented below in the visit note. 

## 2015-12-05 NOTE — Assessment & Plan Note (Signed)
Check tsh  Titrate med dose if needed  

## 2015-12-05 NOTE — Assessment & Plan Note (Signed)
Oxygenation has improved while awake. We will continue oxygen with her CPAP at night. Plan-DC portable oxygen

## 2015-12-05 NOTE — Patient Instructions (Signed)
We will continue CPAP auto 4-14 with O2 2L from [place], for sleep  Order- DME [facility]    D/c portable O2  Please call as needed

## 2015-12-05 NOTE — Assessment & Plan Note (Signed)
Continue calcium and vitamin d daily Walk as much as possible Will look into prolia

## 2015-12-05 NOTE — Assessment & Plan Note (Signed)
Continuing oxygen through CPAP 4-15/ NIV [facility] Trilogy machine, PS max 30, min 8, Vt 330.

## 2015-12-05 NOTE — Assessment & Plan Note (Signed)
Check labs 

## 2015-12-05 NOTE — Assessment & Plan Note (Signed)
BP elevated here today, taken after she walked in Her granddaughter will start checking her BP at home Continue current medications at current doses cmp

## 2015-12-05 NOTE — Progress Notes (Signed)
07/09/14 -81 yoF never smoker Referred by Dr Asa Lente for sleep medicine consultation. Current CPAP. Wears O2 2L [employer] during day and bled through CPAP( auto 4-14) at night. Epworth Score: 4     son here-she lives with him Hx hip fx 09/2013, goiter surgery complicated by CO2 retention 12/27/13 @ [facility], DVT bilateral UE(? PE). Now off warfarin. Becoming more ambulatory but mostly wheelchair bound for several years by obesity and osteoarthritis. Apparently goiter was very large, constricting airway and contributing to hypoxia and CO2 retention. Son reports little snoring and she denies daytime sleepiness, with no history of obstructive sleep apnea documented. CPAP is for chronic respiratory failure. She is hoping that she can come off of oxygen and CPAP. She denies past known lung disease.  09/12/14- 41 yo F never smoker followed for OSA, complicated by DVT, obesity, had hypercapneic resp failure at time of goiter surgery   Husband here CPAP auto 4-14, continuous O2 2L/ [facility] Can sit on room air for 5 minutes but needs 2 L oxygen for any exertion. No cough or phlegm. Strangles easily drinking thin liquids. ABG on 2 L oxygen 07/09/2014-pH 7.36, PCO2 52.8, PO2 101, HCO3 29.6 CXR 07/09/14 IMPRESSION: 1. Bibasilar subsegmental atelectasis with findings suggesting right base pneumonia. 2. Stable cardiomegaly. Prior median sternotomy . Interim resolution of previously identified large paratracheal mass/goiter. 3. Severe degenerative changes both shoulders with changes suggesting avascular necrosis both humeral heads. Electronically Signed  By: Marcello Moores Register  On: 07/09/2014 14:50  01/21/15- 74 yo F never smoker followed for OSA, complicated by DVT, obesity, had hypercapneic resp failure at time of goiter surgery   Husband here FOLLOWS [id number] O2 2L/ [facility] all the time.Sob-same,no wheezing or coughing. CPAPauto 4-14/ [employer] doing well,pressure good.wears 7-8 hrs. each night. She has no complaints. We  reviewed CXR image and discussed her chronic R diaphragm elevation. CXR 09/12/14 IMPRESSION: 1. Chronic elevation of the right hemidiaphragm. No active lung disease. 2. Stable cardiomegaly. Electronically Signed  By: Ivar Drape M.D.  On: 09/12/2014 14:55  08/07/2015-82 year old female never smoker followed for OSA, Dated by DVT, obesity, had hypercapnic respiratory failure at time of goiter surgery O2 2L/ [facility]- continuous  POC Trilogy/ [employer] NIV mode AVAP-AE  AVAPS rate 3, Vt 330, PS max 30, min 8, EPAP max 15/ min 4, ramp off,  Download- 83% of days, avg 3 hr 10 min FOLLOWS FOR: DME is [facility]; Wears O2 all the time however not wearing CPAP-hard time with throat tickles and machine. Would like to know about coming off the of the CPAP.  Compliance download shows that she keeps trying her Trilogy device but tends to take it off after a couple of hours each night. Wearing it she develops a throat tickle cough which she does not experience breathing room air. Walk test on room air-08/07/15-held saturation 97% on room air, resting saturation on room air 97%. Walked slowly with a walker 185 feet. ABG on room air 08/07/2015-pH 7.40, PCO2 49, PO2 67.5, HCO3 29.9, saturation 92.1%.  12/05/2015-82 year old female never smoker followed for OSA, complicated by DVT, obesity, had hypercapnic respiratory failure at time of goiter surgery                  Son here O2 2 L/[facility]-continuous POCTrilogy/ [facility] NIV mode AVAP-AE  AVAPS rate 3, Vt 330, PS max 30, min 8, EPAP max 15/ min 4, ramp off, Pt states that she feels good and her cough has improved. Pt denies cough/wheeze/SOB/CP/tightness. Pt has not needed supplemental O2  for about 2 days.  She has not been using portable oxygen much at all and paces herself as needed during the day. We discussed quitting portable oxygen to save money.  ROS-see HPI Constitutional:   No-   weight loss, night sweats, fevers, chills, fatigue, lassitude. HEENT:   No-   headaches, difficulty swallowing, tooth/dental problems, sore throat,       No-  sneezing, itching, ear ache, nasal congestion, post nasal drip,  CV:  No-   chest pain, orthopnea, PND, swelling in lower extremities, anasarca,                                                       dizziness, palpitations Resp: +shortness of breath with exertion or at rest.              productive cough,  No non-productive cough,  No- coughing up of blood.              No-   change in color of mucus.  No- wheezing.   Skin: No-   rash or lesions. GI:  + heartburn, No- indigestion, abdominal pain, nausea, vomiting,  GU: . MS:  No-   joint pain or swelling.   Neuro-     nothing unusual Psych:  No- change in mood or affect. No depression or anxiety.  No memory loss.  OBJ- Physical Exam  + obesity, +walker, O2 L General- Alert, Oriented, Affect-appropriate, Distress- none acute Skin- rash-none, lesions- none, excoriation- none Lymphadenopathy- none Head- atraumatic            Eyes- Gross vision intact, PERRLA, conjunctivae and secretions clear            Ears- Hearing, canals-normal            Nose- Clear, no-Septal dev, mucus, polyps, erosion, perforation             Throat- Mallampati III-IV , mucosa clear , drainage- none, tonsils- atrophic, + dentures Neck- flexible , trachea midline, no stridor , carotid no bruit, thyroidectomy/tracheostomy scar  Chest - symmetrical excursion , unlabored           Heart/CV- RRR , no murmur , no gallop  , no rub, nl s1 s2                           - JVD- none , edema- none, stasis changes- none, varices- none           Lung- clear, unlabored at rest, wheeze- none, cough- none , dullness-none, rub- none           Chest wall-  Abd-  Br/ Gen/ Rectal- Not done, not indicated Extrem- cyanosis- none, clubbing, none, atrophy- none, strength- nl, + heavy without definite edema,  Neuro- grossly intact to observation

## 2015-12-05 NOTE — Progress Notes (Signed)
Subjective:    Patient ID: Shawna Fuentes, female    DOB: [birth year], 82 y.o.   MRN: [id number]  HPI She is here for follow up.  Hypertension: She is taking her medication daily.  She denies chest pain, palpitations,  and regular headaches. She has chronic leg edema that is controlled with lasix.  she has chronic SOB with exertion that has improved.  She is not exercising regularly, but has been more active.  She does not monitor her blood pressure at home, but her son said her granddaughter can check her BP at home on occasion.    Hyperlipidemia: She is taking her medication daily. She is compliant with a low fat/cholesterol diet. She is more active and walking around the house.     Hypothyroidism:  She is taking her medication daily.  She denies any recent changes in energy.  She has gained weight and she feels like she has been more active.   GERD:  She ran out of her pepcid and has been taking tums, which do not work as well as the [employer].  She sometimes has GERD 1-2 times a week.   She is using her CPAP at night.   She sometimes uses the oxygen when she walks.    Leg cramps:  She has been getting leg cramps at night.  She denies cramping during the day.  She denies pain with walking.   Osteoporosis:  She has OP and has never been on medication.  She is taking her calcium and vitamin d daily.  She is trying to be more active and walks around the house.  She has broken her hip. She does have GERD on a regular basis.   Medications and allergies reviewed with patient and updated if appropriate.  Patient Active Problem List   Diagnosis Date Noted  . Obstructive sleep apnea 09/15/2014  . Osteoporosis 07/15/2014  . Arthritis pain of hand 07/10/2014  . Anemia, iron deficiency 07/10/2014  . Hypocalcemia 04/09/2014  . Deep vein thrombosis ([place]) 12/24/2013  . Chronic respiratory failure with hypercapnia ([place]) 12/21/2013  . Post-surgical hypothyroidism 12/21/2013  . Hypoxemia 12/20/2013    . Edema 12/12/2013  . Fracture, subtrochanteric, right femur, closed ([place]) 09/22/2013  . Chronic radicular low back pain 12/30/2012  . Osteoarthritis of right knee 12/30/2012  . Hyperlipidemia   . Hypertension     Current Outpatient Prescriptions on File Prior to Visit  Medication Sig Dispense Refill  . aspirin 81 MG tablet Take 1 tablet (81 mg total) by mouth daily. 30 tablet   . atorvastatin (LIPITOR) 10 MG tablet Take 1 tablet (10 mg total) by mouth every evening. 90 tablet 1  . calcitRIOL (ROCALTROL) 0.25 MCG capsule Take 1 capsule (0.25 mcg total) by mouth 2 (two) times daily. 180 capsule 0  . calcium carbonate (OS-CAL) 600 MG TABS tablet Take 1 tablet (600 mg total) by mouth 2 (two) times daily with a meal. 60 tablet 5  . famotidine (PEPCID) 10 MG tablet Take 1 tablet (10 mg total) by mouth 2 (two) times daily.    . furosemide (LASIX) 40 MG tablet TAKE 1 TABLET BY MOUTH EVERY DAY 90 tablet 1  . levothyroxine (SYNTHROID, LEVOTHROID) 137 MCG tablet Take 1 tablet (137 mcg total) by mouth daily before breakfast. 90 tablet 1  . metoprolol tartrate (LOPRESSOR) 25 MG tablet TAKE 1/2 TABLET BY MOUTH TWICE A DAY 90 tablet 1  . nystatin (MYCOSTATIN/NYSTOP) 100000 UNIT/GM POWD Apply to affected area 3  times a day 60 g 0  . sulindac (CLINORIL) 200 MG tablet TAKE 1 TABLET BY MOUTH TWICE A DAY 180 tablet 0  . traMADol (ULTRAM) 50 MG tablet Take 1 tablet (50 mg total) by mouth every 8 (eight) hours as needed. 30 tablet 0  . valsartan (DIOVAN) 160 MG tablet TAKE 1 TABLET BY MOUTH EVERY DAY 90 tablet 2  . Vitamin D, Ergocalciferol, (DRISDOL) 50000 UNITS CAPS capsule Take 50,000 Units by mouth.     No current facility-administered medications on file prior to visit.    Past Medical History  Diagnosis Date  . Arthritis   . Hyperlipidemia   . Hypertension   . History of kidney stones   . Hypothyroidism (acquired) 12/2013    s/p thyroidectomy 12/21/13 at [facility]  . Thyroid goiter     s/p  thyroidectomy - pathology demonstrated nodular hyperplasia  . DVT (deep venous thrombosis) ([place]) 12/2013    bilateral upper extremity DVT found at time of thyroidectomy   . Osteoporosis 07/15/2014    DEXA @ [facility] 07/14/14: -3.1    Past Surgical History  Procedure Laterality Date  . Hip surgery    . Femur im nail Right 09/22/2013    Procedure: INTRAMEDULLARY (IM) NAIL FEMORAL;  Surgeon: Wylene Simmer, MD;  Location: [place];  Service: Orthopedics;  Laterality: Right;  . Thyroidectomy with sternotomy  12/21/13    thyroid mass found incidentally on CT scan 12/2013; transferred to [facility] for surgical removal of thyroid and goiter requiring sternotomy    Social History   Social History  . Marital Status: Widowed    Spouse Name: N/A  . Number of Children: N/A  . Years of Education: N/A   Occupational History  . retired    Social History Main Topics  . Smoking status: Never Smoker   . Smokeless tobacco: None     Comment: lives w/ dtr dorthea, 2nd dtr and 3 sons in town  . Alcohol Use: No  . Drug Use: No  . Sexual Activity: Not Asked   Other Topics Concern  . 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   Lives with daughter dorthea - has 3 sons and another daughter in town, supportive    Family History  Problem Relation Age of Onset  . Osteoarthritis Mother   . Hypertension Mother   . Hypertension Father   . Diabetes Son   . Diabetes Son   . Cancer Father     unknown type  . Clotting disorder    . Rheumatologic disease      Review of Systems  Constitutional: Negative for fever, chills and fatigue.  Respiratory: Positive for shortness of breath (mild with exertion - imporved). Negative for cough and wheezing.   Cardiovascular: Positive for leg swelling (controlled with lasix). Negative for chest pain and palpitations.  Gastrointestinal: Negative for abdominal pain.  Musculoskeletal:  Positive for arthralgias (knee pain).  Neurological: Negative for light-headedness and headaches.       Objective:   Filed Vitals:   12/05/15 0844  BP: 152/84  Pulse: 69  Temp: 98.3 F (36.8 C)  Resp: 18   Filed Weights   12/05/15 0844  Weight: 256 lb (116.121 kg)   Body mass index is 46.81 kg/(m^2).   Physical Exam Constitutional: Appears well-developed and well-nourished. No distress.  Neck: Neck supple. No tracheal deviation present. No thyromegaly present.  No carotid bruit.  No cervical adenopathy.   Cardiovascular: Normal rate, regular rhythm and normal heart sounds.   No murmur heard.  No edema Pulmonary/Chest: Effort normal and breath sounds normal. No respiratory distress. No wheezes.       Assessment & Plan:   See Problem List for Assessment and Plan of chronic medical problems.  F/u in 6 months

## 2015-12-05 NOTE — Assessment & Plan Note (Signed)
Controlled with lasix Continue lasix Check cmp

## 2015-12-05 NOTE — Assessment & Plan Note (Signed)
Taking atorvastatin daily

## 2015-12-05 NOTE — Assessment & Plan Note (Signed)
Check cbc, iron levels

## 2015-12-06 DIAGNOSIS — E669 Obesity, unspecified: Secondary | ICD-10-CM | POA: Diagnosis not present

## 2015-12-07 DIAGNOSIS — E669 Obesity, unspecified: Secondary | ICD-10-CM | POA: Diagnosis not present

## 2015-12-07 LAB — VITAMIN D 1,25 DIHYDROXY
Vitamin D 1, 25 (OH)2 Total: 33 pg/mL (ref 18–72)
Vitamin D2 1, 25 (OH)2: <8 pg/mL
Vitamin D3 1, 25 (OH)2: 33 pg/mL

## 2015-12-08 DIAGNOSIS — E669 Obesity, unspecified: Secondary | ICD-10-CM | POA: Diagnosis not present

## 2015-12-09 DIAGNOSIS — E669 Obesity, unspecified: Secondary | ICD-10-CM | POA: Diagnosis not present

## 2015-12-10 DIAGNOSIS — G8929 Other chronic pain: Secondary | ICD-10-CM | POA: Diagnosis not present

## 2015-12-10 DIAGNOSIS — J9610 Chronic respiratory failure, unspecified whether with hypoxia or hypercapnia: Secondary | ICD-10-CM | POA: Diagnosis not present

## 2015-12-10 DIAGNOSIS — M5416 Radiculopathy, lumbar region: Secondary | ICD-10-CM | POA: Diagnosis not present

## 2015-12-10 DIAGNOSIS — E669 Obesity, unspecified: Secondary | ICD-10-CM | POA: Diagnosis not present

## 2015-12-10 DIAGNOSIS — M6281 Muscle weakness (generalized): Secondary | ICD-10-CM | POA: Diagnosis not present

## 2015-12-11 DIAGNOSIS — E669 Obesity, unspecified: Secondary | ICD-10-CM | POA: Diagnosis not present

## 2015-12-12 ENCOUNTER — Encounter: Payer: Self-pay | Admitting: Emergency Medicine

## 2015-12-12 DIAGNOSIS — E669 Obesity, unspecified: Secondary | ICD-10-CM | POA: Diagnosis not present

## 2015-12-13 DIAGNOSIS — E669 Obesity, unspecified: Secondary | ICD-10-CM | POA: Diagnosis not present

## 2015-12-14 DIAGNOSIS — E669 Obesity, unspecified: Secondary | ICD-10-CM | POA: Diagnosis not present

## 2015-12-15 DIAGNOSIS — J449 Chronic obstructive pulmonary disease, unspecified: Secondary | ICD-10-CM | POA: Diagnosis not present

## 2015-12-15 DIAGNOSIS — J9610 Chronic respiratory failure, unspecified whether with hypoxia or hypercapnia: Secondary | ICD-10-CM | POA: Diagnosis not present

## 2015-12-15 DIAGNOSIS — E669 Obesity, unspecified: Secondary | ICD-10-CM | POA: Diagnosis not present

## 2015-12-16 DIAGNOSIS — E669 Obesity, unspecified: Secondary | ICD-10-CM | POA: Diagnosis not present

## 2015-12-17 DIAGNOSIS — E669 Obesity, unspecified: Secondary | ICD-10-CM | POA: Diagnosis not present

## 2015-12-18 DIAGNOSIS — E669 Obesity, unspecified: Secondary | ICD-10-CM | POA: Diagnosis not present

## 2015-12-19 DIAGNOSIS — E669 Obesity, unspecified: Secondary | ICD-10-CM | POA: Diagnosis not present

## 2015-12-20 DIAGNOSIS — E669 Obesity, unspecified: Secondary | ICD-10-CM | POA: Diagnosis not present

## 2015-12-21 DIAGNOSIS — E669 Obesity, unspecified: Secondary | ICD-10-CM | POA: Diagnosis not present

## 2015-12-22 DIAGNOSIS — E669 Obesity, unspecified: Secondary | ICD-10-CM | POA: Diagnosis not present

## 2015-12-23 DIAGNOSIS — E669 Obesity, unspecified: Secondary | ICD-10-CM | POA: Diagnosis not present

## 2015-12-24 DIAGNOSIS — E669 Obesity, unspecified: Secondary | ICD-10-CM | POA: Diagnosis not present

## 2015-12-25 DIAGNOSIS — E669 Obesity, unspecified: Secondary | ICD-10-CM | POA: Diagnosis not present

## 2015-12-26 DIAGNOSIS — E669 Obesity, unspecified: Secondary | ICD-10-CM | POA: Diagnosis not present

## 2015-12-27 DIAGNOSIS — E669 Obesity, unspecified: Secondary | ICD-10-CM | POA: Diagnosis not present

## 2015-12-28 DIAGNOSIS — E669 Obesity, unspecified: Secondary | ICD-10-CM | POA: Diagnosis not present

## 2015-12-29 DIAGNOSIS — E669 Obesity, unspecified: Secondary | ICD-10-CM | POA: Diagnosis not present

## 2015-12-30 DIAGNOSIS — E669 Obesity, unspecified: Secondary | ICD-10-CM | POA: Diagnosis not present

## 2015-12-31 DIAGNOSIS — E669 Obesity, unspecified: Secondary | ICD-10-CM | POA: Diagnosis not present

## 2016-01-01 DIAGNOSIS — E669 Obesity, unspecified: Secondary | ICD-10-CM | POA: Diagnosis not present

## 2016-01-02 DIAGNOSIS — E669 Obesity, unspecified: Secondary | ICD-10-CM | POA: Diagnosis not present

## 2016-01-03 DIAGNOSIS — E669 Obesity, unspecified: Secondary | ICD-10-CM | POA: Diagnosis not present

## 2016-01-04 DIAGNOSIS — E669 Obesity, unspecified: Secondary | ICD-10-CM | POA: Diagnosis not present

## 2016-01-05 DIAGNOSIS — E669 Obesity, unspecified: Secondary | ICD-10-CM | POA: Diagnosis not present

## 2016-01-07 DIAGNOSIS — E669 Obesity, unspecified: Secondary | ICD-10-CM | POA: Diagnosis not present

## 2016-01-08 DIAGNOSIS — E669 Obesity, unspecified: Secondary | ICD-10-CM | POA: Diagnosis not present

## 2016-01-09 DIAGNOSIS — J9610 Chronic respiratory failure, unspecified whether with hypoxia or hypercapnia: Secondary | ICD-10-CM | POA: Diagnosis not present

## 2016-01-09 DIAGNOSIS — M5416 Radiculopathy, lumbar region: Secondary | ICD-10-CM | POA: Diagnosis not present

## 2016-01-09 DIAGNOSIS — G8929 Other chronic pain: Secondary | ICD-10-CM | POA: Diagnosis not present

## 2016-01-09 DIAGNOSIS — M6281 Muscle weakness (generalized): Secondary | ICD-10-CM | POA: Diagnosis not present

## 2016-01-09 DIAGNOSIS — E669 Obesity, unspecified: Secondary | ICD-10-CM | POA: Diagnosis not present

## 2016-01-10 DIAGNOSIS — E669 Obesity, unspecified: Secondary | ICD-10-CM | POA: Diagnosis not present

## 2016-01-11 DIAGNOSIS — E669 Obesity, unspecified: Secondary | ICD-10-CM | POA: Diagnosis not present

## 2016-01-12 DIAGNOSIS — E669 Obesity, unspecified: Secondary | ICD-10-CM | POA: Diagnosis not present

## 2016-01-13 DIAGNOSIS — E669 Obesity, unspecified: Secondary | ICD-10-CM | POA: Diagnosis not present

## 2016-01-14 DIAGNOSIS — J9610 Chronic respiratory failure, unspecified whether with hypoxia or hypercapnia: Secondary | ICD-10-CM | POA: Diagnosis not present

## 2016-01-14 DIAGNOSIS — J449 Chronic obstructive pulmonary disease, unspecified: Secondary | ICD-10-CM | POA: Diagnosis not present

## 2016-01-14 DIAGNOSIS — E669 Obesity, unspecified: Secondary | ICD-10-CM | POA: Diagnosis not present

## 2016-01-15 DIAGNOSIS — E669 Obesity, unspecified: Secondary | ICD-10-CM | POA: Diagnosis not present

## 2016-01-16 DIAGNOSIS — E669 Obesity, unspecified: Secondary | ICD-10-CM | POA: Diagnosis not present

## 2016-01-17 DIAGNOSIS — E669 Obesity, unspecified: Secondary | ICD-10-CM | POA: Diagnosis not present

## 2016-01-18 DIAGNOSIS — E669 Obesity, unspecified: Secondary | ICD-10-CM | POA: Diagnosis not present

## 2016-01-19 DIAGNOSIS — E669 Obesity, unspecified: Secondary | ICD-10-CM | POA: Diagnosis not present

## 2016-01-20 DIAGNOSIS — E669 Obesity, unspecified: Secondary | ICD-10-CM | POA: Diagnosis not present

## 2016-01-21 ENCOUNTER — Telehealth: Payer: Self-pay

## 2016-01-21 DIAGNOSIS — E669 Obesity, unspecified: Secondary | ICD-10-CM | POA: Diagnosis not present

## 2016-01-21 NOTE — Telephone Encounter (Signed)
Call to Shawna Fuentes and she is still checking with her son regarding bringing her for prolia shot.  Asked if she comes, can she stay for AWV. Will check with her son and I will fup

## 2016-01-21 NOTE — Telephone Encounter (Signed)
Advised patient that insurance has been verified for prolia injections---with estimated copay $200---patient will check with her son/her transportation to find out what day she can come for nurse visit to get injection---patient will call back to schedule nurse visit----injection can be given at patient's earliest convenience---can talk with Laquasha Groome if any questions

## 2016-01-22 DIAGNOSIS — E669 Obesity, unspecified: Secondary | ICD-10-CM | POA: Diagnosis not present

## 2016-01-23 ENCOUNTER — Telehealth: Payer: Self-pay

## 2016-01-23 DIAGNOSIS — E669 Obesity, unspecified: Secondary | ICD-10-CM | POA: Diagnosis not present

## 2016-01-23 NOTE — Telephone Encounter (Signed)
Patient's insurance for prolia injection has been verified, estimated $200 copay---patient advised of copay and has scheduled nurse visit for prolia injection on 02/10/16----routing to dr burns, fyi.Marland KitchenMarland Kitchen

## 2016-01-23 NOTE — Telephone Encounter (Signed)
Son called back, will be transportation for patient, has scheduled 8/8 nurse visit to get prolia injection---advised son of estimated $200 copay

## 2016-01-24 DIAGNOSIS — E669 Obesity, unspecified: Secondary | ICD-10-CM | POA: Diagnosis not present

## 2016-01-25 DIAGNOSIS — E669 Obesity, unspecified: Secondary | ICD-10-CM | POA: Diagnosis not present

## 2016-01-26 DIAGNOSIS — E669 Obesity, unspecified: Secondary | ICD-10-CM | POA: Diagnosis not present

## 2016-01-27 DIAGNOSIS — E669 Obesity, unspecified: Secondary | ICD-10-CM | POA: Diagnosis not present

## 2016-01-28 DIAGNOSIS — E669 Obesity, unspecified: Secondary | ICD-10-CM | POA: Diagnosis not present

## 2016-01-29 DIAGNOSIS — E669 Obesity, unspecified: Secondary | ICD-10-CM | POA: Diagnosis not present

## 2016-01-30 DIAGNOSIS — E669 Obesity, unspecified: Secondary | ICD-10-CM | POA: Diagnosis not present

## 2016-01-31 DIAGNOSIS — E669 Obesity, unspecified: Secondary | ICD-10-CM | POA: Diagnosis not present

## 2016-02-01 DIAGNOSIS — E669 Obesity, unspecified: Secondary | ICD-10-CM | POA: Diagnosis not present

## 2016-02-02 DIAGNOSIS — E669 Obesity, unspecified: Secondary | ICD-10-CM | POA: Diagnosis not present

## 2016-02-03 DIAGNOSIS — E669 Obesity, unspecified: Secondary | ICD-10-CM | POA: Diagnosis not present

## 2016-02-04 DIAGNOSIS — E669 Obesity, unspecified: Secondary | ICD-10-CM | POA: Diagnosis not present

## 2016-02-05 DIAGNOSIS — E669 Obesity, unspecified: Secondary | ICD-10-CM | POA: Diagnosis not present

## 2016-02-06 DIAGNOSIS — E669 Obesity, unspecified: Secondary | ICD-10-CM | POA: Diagnosis not present

## 2016-02-07 DIAGNOSIS — E669 Obesity, unspecified: Secondary | ICD-10-CM | POA: Diagnosis not present

## 2016-02-08 DIAGNOSIS — E669 Obesity, unspecified: Secondary | ICD-10-CM | POA: Diagnosis not present

## 2016-02-09 DIAGNOSIS — E669 Obesity, unspecified: Secondary | ICD-10-CM | POA: Diagnosis not present

## 2016-02-09 DIAGNOSIS — M5416 Radiculopathy, lumbar region: Secondary | ICD-10-CM | POA: Diagnosis not present

## 2016-02-09 DIAGNOSIS — M6281 Muscle weakness (generalized): Secondary | ICD-10-CM | POA: Diagnosis not present

## 2016-02-09 DIAGNOSIS — G8929 Other chronic pain: Secondary | ICD-10-CM | POA: Diagnosis not present

## 2016-02-09 DIAGNOSIS — J9610 Chronic respiratory failure, unspecified whether with hypoxia or hypercapnia: Secondary | ICD-10-CM | POA: Diagnosis not present

## 2016-02-10 ENCOUNTER — Ambulatory Visit (INDEPENDENT_AMBULATORY_CARE_PROVIDER_SITE_OTHER): Payer: Commercial Managed Care - HMO | Admitting: Emergency Medicine

## 2016-02-10 DIAGNOSIS — E669 Obesity, unspecified: Secondary | ICD-10-CM | POA: Diagnosis not present

## 2016-02-10 DIAGNOSIS — M810 Age-related osteoporosis without current pathological fracture: Secondary | ICD-10-CM

## 2016-02-10 MED ORDER — DENOSUMAB 60 MG/ML ~~LOC~~ SOLN
60.0000 mg | Freq: Once | SUBCUTANEOUS | Status: AC
  Administered 2016-02-10: 60 mg via SUBCUTANEOUS

## 2016-02-10 NOTE — Progress Notes (Signed)
Pt son will bring back card to pay for injection co-pay.

## 2016-02-11 DIAGNOSIS — E669 Obesity, unspecified: Secondary | ICD-10-CM | POA: Diagnosis not present

## 2016-02-12 DIAGNOSIS — E669 Obesity, unspecified: Secondary | ICD-10-CM | POA: Diagnosis not present

## 2016-02-13 DIAGNOSIS — E669 Obesity, unspecified: Secondary | ICD-10-CM | POA: Diagnosis not present

## 2016-02-14 DIAGNOSIS — J449 Chronic obstructive pulmonary disease, unspecified: Secondary | ICD-10-CM | POA: Diagnosis not present

## 2016-02-14 DIAGNOSIS — E669 Obesity, unspecified: Secondary | ICD-10-CM | POA: Diagnosis not present

## 2016-02-14 DIAGNOSIS — J9610 Chronic respiratory failure, unspecified whether with hypoxia or hypercapnia: Secondary | ICD-10-CM | POA: Diagnosis not present

## 2016-02-15 DIAGNOSIS — E669 Obesity, unspecified: Secondary | ICD-10-CM | POA: Diagnosis not present

## 2016-02-16 DIAGNOSIS — E669 Obesity, unspecified: Secondary | ICD-10-CM | POA: Diagnosis not present

## 2016-02-17 DIAGNOSIS — E669 Obesity, unspecified: Secondary | ICD-10-CM | POA: Diagnosis not present

## 2016-02-18 DIAGNOSIS — E669 Obesity, unspecified: Secondary | ICD-10-CM | POA: Diagnosis not present

## 2016-02-19 DIAGNOSIS — E669 Obesity, unspecified: Secondary | ICD-10-CM | POA: Diagnosis not present

## 2016-02-20 DIAGNOSIS — E669 Obesity, unspecified: Secondary | ICD-10-CM | POA: Diagnosis not present

## 2016-02-21 DIAGNOSIS — E669 Obesity, unspecified: Secondary | ICD-10-CM | POA: Diagnosis not present

## 2016-02-22 DIAGNOSIS — E669 Obesity, unspecified: Secondary | ICD-10-CM | POA: Diagnosis not present

## 2016-02-25 DIAGNOSIS — E669 Obesity, unspecified: Secondary | ICD-10-CM | POA: Diagnosis not present

## 2016-02-26 DIAGNOSIS — E669 Obesity, unspecified: Secondary | ICD-10-CM | POA: Diagnosis not present

## 2016-02-27 DIAGNOSIS — E669 Obesity, unspecified: Secondary | ICD-10-CM | POA: Diagnosis not present

## 2016-02-28 DIAGNOSIS — E669 Obesity, unspecified: Secondary | ICD-10-CM | POA: Diagnosis not present

## 2016-02-29 DIAGNOSIS — E669 Obesity, unspecified: Secondary | ICD-10-CM | POA: Diagnosis not present

## 2016-03-01 DIAGNOSIS — E669 Obesity, unspecified: Secondary | ICD-10-CM | POA: Diagnosis not present

## 2016-03-02 DIAGNOSIS — E669 Obesity, unspecified: Secondary | ICD-10-CM | POA: Diagnosis not present

## 2016-03-03 DIAGNOSIS — E669 Obesity, unspecified: Secondary | ICD-10-CM | POA: Diagnosis not present

## 2016-03-04 DIAGNOSIS — E669 Obesity, unspecified: Secondary | ICD-10-CM | POA: Diagnosis not present

## 2016-03-05 DIAGNOSIS — E669 Obesity, unspecified: Secondary | ICD-10-CM | POA: Diagnosis not present

## 2016-03-06 DIAGNOSIS — E669 Obesity, unspecified: Secondary | ICD-10-CM | POA: Diagnosis not present

## 2016-03-07 DIAGNOSIS — E669 Obesity, unspecified: Secondary | ICD-10-CM | POA: Diagnosis not present

## 2016-03-09 DIAGNOSIS — E669 Obesity, unspecified: Secondary | ICD-10-CM | POA: Diagnosis not present

## 2016-03-10 DIAGNOSIS — E669 Obesity, unspecified: Secondary | ICD-10-CM | POA: Diagnosis not present

## 2016-03-11 DIAGNOSIS — G8929 Other chronic pain: Secondary | ICD-10-CM | POA: Diagnosis not present

## 2016-03-11 DIAGNOSIS — E669 Obesity, unspecified: Secondary | ICD-10-CM | POA: Diagnosis not present

## 2016-03-11 DIAGNOSIS — J9610 Chronic respiratory failure, unspecified whether with hypoxia or hypercapnia: Secondary | ICD-10-CM | POA: Diagnosis not present

## 2016-03-11 DIAGNOSIS — M6281 Muscle weakness (generalized): Secondary | ICD-10-CM | POA: Diagnosis not present

## 2016-03-11 DIAGNOSIS — M5416 Radiculopathy, lumbar region: Secondary | ICD-10-CM | POA: Diagnosis not present

## 2016-03-12 DIAGNOSIS — E669 Obesity, unspecified: Secondary | ICD-10-CM | POA: Diagnosis not present

## 2016-03-13 DIAGNOSIS — E669 Obesity, unspecified: Secondary | ICD-10-CM | POA: Diagnosis not present

## 2016-03-14 DIAGNOSIS — E669 Obesity, unspecified: Secondary | ICD-10-CM | POA: Diagnosis not present

## 2016-03-15 DIAGNOSIS — E669 Obesity, unspecified: Secondary | ICD-10-CM | POA: Diagnosis not present

## 2016-03-16 DIAGNOSIS — E669 Obesity, unspecified: Secondary | ICD-10-CM | POA: Diagnosis not present

## 2016-03-17 DIAGNOSIS — E669 Obesity, unspecified: Secondary | ICD-10-CM | POA: Diagnosis not present

## 2016-03-18 DIAGNOSIS — E669 Obesity, unspecified: Secondary | ICD-10-CM | POA: Diagnosis not present

## 2016-03-19 DIAGNOSIS — E669 Obesity, unspecified: Secondary | ICD-10-CM | POA: Diagnosis not present

## 2016-03-20 DIAGNOSIS — E669 Obesity, unspecified: Secondary | ICD-10-CM | POA: Diagnosis not present

## 2016-03-21 DIAGNOSIS — E669 Obesity, unspecified: Secondary | ICD-10-CM | POA: Diagnosis not present

## 2016-03-22 DIAGNOSIS — E669 Obesity, unspecified: Secondary | ICD-10-CM | POA: Diagnosis not present

## 2016-03-23 DIAGNOSIS — J449 Chronic obstructive pulmonary disease, unspecified: Secondary | ICD-10-CM | POA: Diagnosis not present

## 2016-03-23 DIAGNOSIS — J9610 Chronic respiratory failure, unspecified whether with hypoxia or hypercapnia: Secondary | ICD-10-CM | POA: Diagnosis not present

## 2016-03-23 DIAGNOSIS — E669 Obesity, unspecified: Secondary | ICD-10-CM | POA: Diagnosis not present

## 2016-03-24 DIAGNOSIS — E669 Obesity, unspecified: Secondary | ICD-10-CM | POA: Diagnosis not present

## 2016-03-25 DIAGNOSIS — E669 Obesity, unspecified: Secondary | ICD-10-CM | POA: Diagnosis not present

## 2016-03-27 DIAGNOSIS — E669 Obesity, unspecified: Secondary | ICD-10-CM | POA: Diagnosis not present

## 2016-03-28 DIAGNOSIS — E669 Obesity, unspecified: Secondary | ICD-10-CM | POA: Diagnosis not present

## 2016-03-29 DIAGNOSIS — E669 Obesity, unspecified: Secondary | ICD-10-CM | POA: Diagnosis not present

## 2016-03-30 DIAGNOSIS — E669 Obesity, unspecified: Secondary | ICD-10-CM | POA: Diagnosis not present

## 2016-03-31 DIAGNOSIS — E669 Obesity, unspecified: Secondary | ICD-10-CM | POA: Diagnosis not present

## 2016-04-01 DIAGNOSIS — E669 Obesity, unspecified: Secondary | ICD-10-CM | POA: Diagnosis not present

## 2016-04-02 DIAGNOSIS — E669 Obesity, unspecified: Secondary | ICD-10-CM | POA: Diagnosis not present

## 2016-04-03 DIAGNOSIS — E669 Obesity, unspecified: Secondary | ICD-10-CM | POA: Diagnosis not present

## 2016-04-04 DIAGNOSIS — E669 Obesity, unspecified: Secondary | ICD-10-CM | POA: Diagnosis not present

## 2016-04-05 DIAGNOSIS — E669 Obesity, unspecified: Secondary | ICD-10-CM | POA: Diagnosis not present

## 2016-04-07 DIAGNOSIS — E669 Obesity, unspecified: Secondary | ICD-10-CM | POA: Diagnosis not present

## 2016-04-08 DIAGNOSIS — E669 Obesity, unspecified: Secondary | ICD-10-CM | POA: Diagnosis not present

## 2016-04-09 DIAGNOSIS — E669 Obesity, unspecified: Secondary | ICD-10-CM | POA: Diagnosis not present

## 2016-04-10 DIAGNOSIS — J9610 Chronic respiratory failure, unspecified whether with hypoxia or hypercapnia: Secondary | ICD-10-CM | POA: Diagnosis not present

## 2016-04-10 DIAGNOSIS — E669 Obesity, unspecified: Secondary | ICD-10-CM | POA: Diagnosis not present

## 2016-04-10 DIAGNOSIS — M5416 Radiculopathy, lumbar region: Secondary | ICD-10-CM | POA: Diagnosis not present

## 2016-04-10 DIAGNOSIS — M6281 Muscle weakness (generalized): Secondary | ICD-10-CM | POA: Diagnosis not present

## 2016-04-10 DIAGNOSIS — G8929 Other chronic pain: Secondary | ICD-10-CM | POA: Diagnosis not present

## 2016-04-11 DIAGNOSIS — E669 Obesity, unspecified: Secondary | ICD-10-CM | POA: Diagnosis not present

## 2016-04-12 DIAGNOSIS — E669 Obesity, unspecified: Secondary | ICD-10-CM | POA: Diagnosis not present

## 2016-04-13 DIAGNOSIS — E669 Obesity, unspecified: Secondary | ICD-10-CM | POA: Diagnosis not present

## 2016-04-14 DIAGNOSIS — E669 Obesity, unspecified: Secondary | ICD-10-CM | POA: Diagnosis not present

## 2016-04-15 DIAGNOSIS — E669 Obesity, unspecified: Secondary | ICD-10-CM | POA: Diagnosis not present

## 2016-04-16 DIAGNOSIS — E669 Obesity, unspecified: Secondary | ICD-10-CM | POA: Diagnosis not present

## 2016-04-17 DIAGNOSIS — E669 Obesity, unspecified: Secondary | ICD-10-CM | POA: Diagnosis not present

## 2016-04-18 DIAGNOSIS — E669 Obesity, unspecified: Secondary | ICD-10-CM | POA: Diagnosis not present

## 2016-04-19 DIAGNOSIS — E669 Obesity, unspecified: Secondary | ICD-10-CM | POA: Diagnosis not present

## 2016-04-20 DIAGNOSIS — E669 Obesity, unspecified: Secondary | ICD-10-CM | POA: Diagnosis not present

## 2016-04-21 DIAGNOSIS — E669 Obesity, unspecified: Secondary | ICD-10-CM | POA: Diagnosis not present

## 2016-04-22 DIAGNOSIS — E669 Obesity, unspecified: Secondary | ICD-10-CM | POA: Diagnosis not present

## 2016-04-22 DIAGNOSIS — J449 Chronic obstructive pulmonary disease, unspecified: Secondary | ICD-10-CM | POA: Diagnosis not present

## 2016-04-22 DIAGNOSIS — J9610 Chronic respiratory failure, unspecified whether with hypoxia or hypercapnia: Secondary | ICD-10-CM | POA: Diagnosis not present

## 2016-04-23 DIAGNOSIS — E669 Obesity, unspecified: Secondary | ICD-10-CM | POA: Diagnosis not present

## 2016-04-24 DIAGNOSIS — E669 Obesity, unspecified: Secondary | ICD-10-CM | POA: Diagnosis not present

## 2016-04-25 DIAGNOSIS — E669 Obesity, unspecified: Secondary | ICD-10-CM | POA: Diagnosis not present

## 2016-04-26 DIAGNOSIS — E669 Obesity, unspecified: Secondary | ICD-10-CM | POA: Diagnosis not present

## 2016-04-27 DIAGNOSIS — E669 Obesity, unspecified: Secondary | ICD-10-CM | POA: Diagnosis not present

## 2016-04-28 DIAGNOSIS — E669 Obesity, unspecified: Secondary | ICD-10-CM | POA: Diagnosis not present

## 2016-04-29 DIAGNOSIS — E669 Obesity, unspecified: Secondary | ICD-10-CM | POA: Diagnosis not present

## 2016-04-30 DIAGNOSIS — E669 Obesity, unspecified: Secondary | ICD-10-CM | POA: Diagnosis not present

## 2016-05-01 DIAGNOSIS — E669 Obesity, unspecified: Secondary | ICD-10-CM | POA: Diagnosis not present

## 2016-05-03 DIAGNOSIS — E669 Obesity, unspecified: Secondary | ICD-10-CM | POA: Diagnosis not present

## 2016-05-04 DIAGNOSIS — E669 Obesity, unspecified: Secondary | ICD-10-CM | POA: Diagnosis not present

## 2016-05-05 DIAGNOSIS — E669 Obesity, unspecified: Secondary | ICD-10-CM | POA: Diagnosis not present

## 2016-05-06 DIAGNOSIS — E669 Obesity, unspecified: Secondary | ICD-10-CM | POA: Diagnosis not present

## 2016-05-07 DIAGNOSIS — E669 Obesity, unspecified: Secondary | ICD-10-CM | POA: Diagnosis not present

## 2016-05-08 DIAGNOSIS — E669 Obesity, unspecified: Secondary | ICD-10-CM | POA: Diagnosis not present

## 2016-05-09 DIAGNOSIS — E669 Obesity, unspecified: Secondary | ICD-10-CM | POA: Diagnosis not present

## 2016-05-10 DIAGNOSIS — E669 Obesity, unspecified: Secondary | ICD-10-CM | POA: Diagnosis not present

## 2016-05-11 ENCOUNTER — Telehealth: Payer: Self-pay | Admitting: Internal Medicine

## 2016-05-11 DIAGNOSIS — M6281 Muscle weakness (generalized): Secondary | ICD-10-CM | POA: Diagnosis not present

## 2016-05-11 DIAGNOSIS — G8929 Other chronic pain: Secondary | ICD-10-CM | POA: Diagnosis not present

## 2016-05-11 DIAGNOSIS — J9610 Chronic respiratory failure, unspecified whether with hypoxia or hypercapnia: Secondary | ICD-10-CM | POA: Diagnosis not present

## 2016-05-11 DIAGNOSIS — M5416 Radiculopathy, lumbar region: Secondary | ICD-10-CM | POA: Diagnosis not present

## 2016-05-11 DIAGNOSIS — E669 Obesity, unspecified: Secondary | ICD-10-CM | POA: Diagnosis not present

## 2016-05-11 NOTE — Telephone Encounter (Signed)
LVM with pt stating that order forms were faxed yesterday.

## 2016-05-11 NOTE — Telephone Encounter (Signed)
Patient called to advise that she got a call from our office about her depends yesterday,. There are no notes in the system, but she insists that we call her. She is asking that we do not cut her depends off. Please follow up

## 2016-05-12 DIAGNOSIS — E669 Obesity, unspecified: Secondary | ICD-10-CM | POA: Diagnosis not present

## 2016-05-13 ENCOUNTER — Encounter: Payer: Self-pay | Admitting: Internal Medicine

## 2016-05-13 DIAGNOSIS — E669 Obesity, unspecified: Secondary | ICD-10-CM | POA: Diagnosis not present

## 2016-05-13 DIAGNOSIS — R32 Unspecified urinary incontinence: Secondary | ICD-10-CM | POA: Insufficient documentation

## 2016-05-14 DIAGNOSIS — E669 Obesity, unspecified: Secondary | ICD-10-CM | POA: Diagnosis not present

## 2016-05-15 DIAGNOSIS — E669 Obesity, unspecified: Secondary | ICD-10-CM | POA: Diagnosis not present

## 2016-05-16 DIAGNOSIS — E669 Obesity, unspecified: Secondary | ICD-10-CM | POA: Diagnosis not present

## 2016-05-17 DIAGNOSIS — E669 Obesity, unspecified: Secondary | ICD-10-CM | POA: Diagnosis not present

## 2016-05-18 DIAGNOSIS — E669 Obesity, unspecified: Secondary | ICD-10-CM | POA: Diagnosis not present

## 2016-05-19 DIAGNOSIS — E669 Obesity, unspecified: Secondary | ICD-10-CM | POA: Diagnosis not present

## 2016-05-20 DIAGNOSIS — E669 Obesity, unspecified: Secondary | ICD-10-CM | POA: Diagnosis not present

## 2016-05-21 ENCOUNTER — Ambulatory Visit (INDEPENDENT_AMBULATORY_CARE_PROVIDER_SITE_OTHER): Payer: Commercial Managed Care - HMO

## 2016-05-21 ENCOUNTER — Telehealth: Payer: Self-pay | Admitting: Emergency Medicine

## 2016-05-21 DIAGNOSIS — E669 Obesity, unspecified: Secondary | ICD-10-CM | POA: Diagnosis not present

## 2016-05-21 DIAGNOSIS — Z23 Encounter for immunization: Secondary | ICD-10-CM | POA: Diagnosis not present

## 2016-05-21 NOTE — Telephone Encounter (Signed)
Pt wants to know if she can get a handicap parking tag. Please advise thanks.

## 2016-05-22 DIAGNOSIS — E669 Obesity, unspecified: Secondary | ICD-10-CM | POA: Diagnosis not present

## 2016-05-23 DIAGNOSIS — J449 Chronic obstructive pulmonary disease, unspecified: Secondary | ICD-10-CM | POA: Diagnosis not present

## 2016-05-23 DIAGNOSIS — J9610 Chronic respiratory failure, unspecified whether with hypoxia or hypercapnia: Secondary | ICD-10-CM | POA: Diagnosis not present

## 2016-05-23 DIAGNOSIS — E669 Obesity, unspecified: Secondary | ICD-10-CM | POA: Diagnosis not present

## 2016-05-24 DIAGNOSIS — E669 Obesity, unspecified: Secondary | ICD-10-CM | POA: Diagnosis not present

## 2016-05-25 DIAGNOSIS — E669 Obesity, unspecified: Secondary | ICD-10-CM | POA: Diagnosis not present

## 2016-05-25 NOTE — Telephone Encounter (Signed)
Handicap form filled out for MDs signature when she returns on 05/31/16

## 2016-05-26 DIAGNOSIS — E669 Obesity, unspecified: Secondary | ICD-10-CM | POA: Diagnosis not present

## 2016-05-29 DIAGNOSIS — E669 Obesity, unspecified: Secondary | ICD-10-CM | POA: Diagnosis not present

## 2016-05-30 DIAGNOSIS — E669 Obesity, unspecified: Secondary | ICD-10-CM | POA: Diagnosis not present

## 2016-05-31 ENCOUNTER — Telehealth: Payer: Self-pay | Admitting: Internal Medicine

## 2016-05-31 DIAGNOSIS — E669 Obesity, unspecified: Secondary | ICD-10-CM | POA: Diagnosis not present

## 2016-05-31 NOTE — Telephone Encounter (Signed)
This pt called in and said that she would like to get her Prolia inj on 12/5 when she comes in.  Is this possible?  If not can you call her and let her know

## 2016-05-31 NOTE — Telephone Encounter (Signed)
Advised patient that her next prolia is due in feb/2018---I will reverify insurance coverage and call her back

## 2016-06-01 DIAGNOSIS — E669 Obesity, unspecified: Secondary | ICD-10-CM | POA: Diagnosis not present

## 2016-06-01 NOTE — Telephone Encounter (Signed)
Spoke with pts husband, will mail handicap form to pt.

## 2016-06-02 DIAGNOSIS — E669 Obesity, unspecified: Secondary | ICD-10-CM | POA: Diagnosis not present

## 2016-06-03 DIAGNOSIS — E669 Obesity, unspecified: Secondary | ICD-10-CM | POA: Diagnosis not present

## 2016-06-04 DIAGNOSIS — E669 Obesity, unspecified: Secondary | ICD-10-CM | POA: Diagnosis not present

## 2016-06-08 ENCOUNTER — Encounter: Payer: Self-pay | Admitting: Internal Medicine

## 2016-06-08 ENCOUNTER — Other Ambulatory Visit (INDEPENDENT_AMBULATORY_CARE_PROVIDER_SITE_OTHER): Payer: Commercial Managed Care - HMO

## 2016-06-08 ENCOUNTER — Ambulatory Visit (INDEPENDENT_AMBULATORY_CARE_PROVIDER_SITE_OTHER): Payer: Commercial Managed Care - HMO | Admitting: Internal Medicine

## 2016-06-08 VITALS — BP 160/80 | HR 65 | Wt 257.0 lb

## 2016-06-08 DIAGNOSIS — I10 Essential (primary) hypertension: Secondary | ICD-10-CM

## 2016-06-08 DIAGNOSIS — E890 Postprocedural hypothyroidism: Secondary | ICD-10-CM | POA: Diagnosis not present

## 2016-06-08 DIAGNOSIS — M810 Age-related osteoporosis without current pathological fracture: Secondary | ICD-10-CM

## 2016-06-08 DIAGNOSIS — E7800 Pure hypercholesterolemia, unspecified: Secondary | ICD-10-CM

## 2016-06-08 DIAGNOSIS — N183 Chronic kidney disease, stage 3 unspecified: Secondary | ICD-10-CM

## 2016-06-08 DIAGNOSIS — R600 Localized edema: Secondary | ICD-10-CM

## 2016-06-08 LAB — COMPREHENSIVE METABOLIC PANEL
ALK PHOS: 95 U/L (ref 39–117)
ALT: 10 U/L (ref 0–35)
AST: 15 U/L (ref 0–37)
Albumin: 4.1 g/dL (ref 3.5–5.2)
BUN: 29 mg/dL — AB (ref 6–23)
CHLORIDE: 103 meq/L (ref 96–112)
CO2: 30 mEq/L (ref 19–32)
Calcium: 9.8 mg/dL (ref 8.4–10.5)
Creatinine, Ser: 1.36 mg/dL — ABNORMAL HIGH (ref 0.40–1.20)
GFR: 47.94 mL/min — ABNORMAL LOW (ref >60.00)
GLUCOSE: 102 mg/dL — AB (ref 70–99)
POTASSIUM: 4.7 meq/L (ref 3.5–5.1)
SODIUM: 142 meq/L (ref 135–145)
TOTAL PROTEIN: 8 g/dL (ref 6.0–8.3)
Total Bilirubin: 0.9 mg/dL (ref 0.2–1.2)

## 2016-06-08 LAB — TSH: TSH: 0.1 u[IU]/mL — ABNORMAL LOW (ref 0.35–4.50)

## 2016-06-08 MED ORDER — AMLODIPINE BESYLATE 2.5 MG PO TABS: 2.5000 mg | ORAL_TABLET | Freq: Every day | ORAL | 3 refills | Status: DC

## 2016-06-08 MED ORDER — FUROSEMIDE 20 MG PO TABS: 20.0000 mg | ORAL_TABLET | Freq: Every day | ORAL | 3 refills | Status: DC

## 2016-06-08 NOTE — Assessment & Plan Note (Signed)
Check tsh  Titrate med dose if needed  

## 2016-06-08 NOTE — Assessment & Plan Note (Signed)
BP ? Controlled She will start monitoring her BP more regularly and her granddaughter, who is a nurse will check it  Will add norvasc 2.5 mg daily Will try decreasing lasix to 20 mg daily - she denies edema and CKD - will see if she needs the 40 mg daily No echo in chart - may need to get an echo cmp

## 2016-06-08 NOTE — Assessment & Plan Note (Addendum)
Had prolia 02/10/16 - will continue Q 6 months Continue calcium and vitamin D  Continue walking

## 2016-06-08 NOTE — Assessment & Plan Note (Signed)
Stable Check cmp Will try decreasing lasix to 20 mg daily Make sure BP is controlled

## 2016-06-08 NOTE — Assessment & Plan Note (Signed)
No edema with lasix 40 mg daily Has CKD - will try decreasing lasix to 20 mg daily - discussed this will be better for her kidneys - if she has increased edema will increase lasix back up to 40 mg alternating with 20 mg or 40 mg daily

## 2016-06-08 NOTE — Progress Notes (Signed)
Subjective:    Patient ID: Shawna Fuentes, female    DOB: 03-Mar-1933, 82 y.o.   MRN: [id number]  HPI The patient is here for follow up.  Hypertension: She is taking her medication daily. She is compliant with a low sodium diet.  She denies chest pain, palpitations, edema, shortness of breath and regular headaches. She is exercising regularly - walks the hallways a few times a day.  She does monitor her blood pressure at home on occasion - yesterday it was "normal".    Hyperlipidemia: She is taking her medication daily. She is compliant with a low fat/cholesterol diet. She is walking for exercise.    Hypothyroidism:  She is taking her medication daily.  She denies any recent changes in energy or weight that are unexplained.     Medications and allergies reviewed with patient and updated if appropriate.  Patient Active Problem List   Diagnosis Date Noted  . Urinary incontinence 05/13/2016  . Cramp of both lower extremities 12/05/2015  . Obstructive sleep apnea 09/15/2014  . Osteoporosis 07/15/2014  . Arthritis pain of hand 07/10/2014  . Anemia, iron deficiency 07/10/2014  . Deep vein thrombosis ([place]) 12/24/2013  . Chronic respiratory failure with hypercapnia ([place]) 12/21/2013  . Post-surgical hypothyroidism 12/21/2013  . Hypoxemia 12/20/2013  . Edema 12/12/2013  . Fracture, subtrochanteric, right femur, closed ([place]) 09/22/2013  . Chronic radicular low back pain 12/30/2012  . Osteoarthritis of right knee 12/30/2012  . Hyperlipidemia   . Hypertension     Current Outpatient Prescriptions on File Prior to Visit  Medication Sig Dispense Refill  . aspirin 81 MG tablet Take 1 tablet (81 mg total) by mouth daily. 30 tablet   . atorvastatin (LIPITOR) 10 MG tablet Take 1 tablet (10 mg total) by mouth every evening. 90 tablet 1  . calcitRIOL (ROCALTROL) 0.25 MCG capsule Take 1 capsule (0.25 mcg total) by mouth 2 (two) times daily. 180 capsule 3  . calcium carbonate (OS-CAL) 600 MG TABS  tablet Take 1 tablet (600 mg total) by mouth 2 (two) times daily with a meal. 180 tablet 3  . famotidine (PEPCID) 20 MG tablet Take 1 tablet (20 mg total) by mouth at bedtime. 90 tablet 3  . furosemide (LASIX) 40 MG tablet Take 1 tablet (40 mg total) by mouth daily. 90 tablet 3  . levothyroxine (SYNTHROID, LEVOTHROID) 137 MCG tablet Take 1 tablet (137 mcg total) by mouth daily before breakfast. 90 tablet 3  . metoprolol tartrate (LOPRESSOR) 25 MG tablet Take 0.5 tablets (12.5 mg total) by mouth 2 (two) times daily. 90 tablet 3  . nystatin (MYCOSTATIN/NYSTOP) 100000 UNIT/GM POWD Apply to affected area 3 times a day 60 g 0  . sulindac (CLINORIL) 200 MG tablet Take 1 tablet (200 mg total) by mouth 2 (two) times daily. 180 tablet 3  . traMADol (ULTRAM) 50 MG tablet Take 1 tablet (50 mg total) by mouth every 8 (eight) hours as needed. 30 tablet 0  . valsartan (DIOVAN) 160 MG tablet TAKE 1 TABLET BY MOUTH EVERY DAY 90 tablet 2  . Vitamin D, Ergocalciferol, (DRISDOL) 50000 UNITS CAPS capsule Take 50,000 Units by mouth.     No current facility-administered medications on file prior to visit.     Past Medical History:  Diagnosis Date  . Arthritis   . DVT (deep venous thrombosis) ([place]) 12/2013   bilateral upper extremity DVT found at time of thyroidectomy   . History of kidney stones   . Hyperlipidemia   .  Hypertension   . Hypothyroidism (acquired) 12/2013   s/p thyroidectomy 12/21/13 at [facility]  . Osteoporosis 07/15/2014   DEXA @ [facility] 07/14/14: -3.1  . Thyroid goiter    s/p thyroidectomy - pathology demonstrated nodular hyperplasia    Past Surgical History:  Procedure Laterality Date  . FEMUR IM NAIL Right 09/22/2013   Procedure: INTRAMEDULLARY (IM) NAIL FEMORAL;  Surgeon: Wylene Simmer, MD;  Location: [place];  Service: Orthopedics;  Laterality: Right;  . HIP SURGERY    . THYROIDECTOMY WITH STERNOTOMY  12/21/13   thyroid mass found incidentally on CT scan 12/2013; transferred to [facility] for surgical  removal of thyroid and goiter requiring sternotomy    Social History   Social History  . Marital status: Widowed    Spouse name: N/A  . Number of children: N/A  . Years of education: N/A   Occupational History  . retired    Social History Main Topics  . Smoking status: Never Smoker  . Smokeless tobacco: None     Comment: lives w/ dtr dorthea, 2nd dtr and 3 sons in town  . Alcohol use No  . Drug use: No  . Sexual activity: Not Asked   Other Topics Concern  . 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   Lives with daughter dorthea - has 3 sons and another daughter in town, supportive    Family History  Problem Relation Age of Onset  . Osteoarthritis Mother   . Hypertension Mother   . Hypertension Father   . Cancer Father     unknown type  . Diabetes Son   . Diabetes Son   . Clotting disorder    . Rheumatologic disease      Review of Systems  Constitutional: Negative for fever.  Respiratory: Positive for cough (occ, dry cough). Negative for shortness of breath and wheezing.   Cardiovascular: Negative for chest pain, palpitations and leg swelling.  Neurological: Negative for light-headedness and headaches.       Objective:   Vitals:   06/08/16 0933  BP: (!) 160/80  Pulse: 65   Filed Weights   06/08/16 0933  Weight: 257 lb (116.6 kg)   Body mass index is 47.01 kg/m.   Physical Exam    Constitutional: Appears well-developed and well-nourished. No distress.  HENT:  Head: Normocephalic and atraumatic.  Neck: Neck supple. No tracheal deviation present. No thyromegaly present.  No cervical lymphadenopathy Cardiovascular: Normal rate, regular rhythm and normal heart sounds.   No murmur heard. No carotid bruit .  No edema Pulmonary/Chest: Effort normal and breath sounds normal. No respiratory distress. No has no wheezes. No rales.  Skin: Skin is warm and dry. Not  diaphoretic.  Psychiatric: Normal mood and affect. Behavior is normal.      Assessment & Plan:    See Problem List for Assessment and Plan of chronic medical problems.

## 2016-06-08 NOTE — Assessment & Plan Note (Signed)
Continue lipitor 10mg daily  

## 2016-06-08 NOTE — Patient Instructions (Signed)
  Test(s) ordered today. Your results will be released to [place] (or called to you) after review, usually within 72hours after test completion. If any changes need to be made, you will be notified at that same time.  All other Health Maintenance issues reviewed.   All recommended immunizations and age-appropriate screenings are up-to-date or discussed.  No immunizations administered today.   Medications reviewed and updated.  Changes include  Decreasing the water pill (furosemide ) to 20 mg daily and add amlodipine 2.5 mg daily.  Continue all your other medications.   Your prescription(s) have been submitted to your pharmacy. Please take as directed and contact our office if you believe you are having problem(s) with the medication(s).   Please followup in 2 weeks.

## 2016-06-09 ENCOUNTER — Other Ambulatory Visit: Payer: Self-pay | Admitting: Emergency Medicine

## 2016-06-09 DIAGNOSIS — E669 Obesity, unspecified: Secondary | ICD-10-CM | POA: Diagnosis not present

## 2016-06-09 MED ORDER — LEVOTHYROXINE SODIUM 125 MCG PO TABS: 125.0000 ug | ORAL_TABLET | Freq: Every day | ORAL | 1 refills | Status: DC

## 2016-06-10 DIAGNOSIS — E669 Obesity, unspecified: Secondary | ICD-10-CM | POA: Diagnosis not present

## 2016-06-10 DIAGNOSIS — M6281 Muscle weakness (generalized): Secondary | ICD-10-CM | POA: Diagnosis not present

## 2016-06-10 DIAGNOSIS — J9610 Chronic respiratory failure, unspecified whether with hypoxia or hypercapnia: Secondary | ICD-10-CM | POA: Diagnosis not present

## 2016-06-10 DIAGNOSIS — G8929 Other chronic pain: Secondary | ICD-10-CM | POA: Diagnosis not present

## 2016-06-10 DIAGNOSIS — M5416 Radiculopathy, lumbar region: Secondary | ICD-10-CM | POA: Diagnosis not present

## 2016-06-11 DIAGNOSIS — E669 Obesity, unspecified: Secondary | ICD-10-CM | POA: Diagnosis not present

## 2016-06-12 DIAGNOSIS — E669 Obesity, unspecified: Secondary | ICD-10-CM | POA: Diagnosis not present

## 2016-06-13 DIAGNOSIS — E669 Obesity, unspecified: Secondary | ICD-10-CM | POA: Diagnosis not present

## 2016-06-14 DIAGNOSIS — E669 Obesity, unspecified: Secondary | ICD-10-CM | POA: Diagnosis not present

## 2016-06-15 DIAGNOSIS — E669 Obesity, unspecified: Secondary | ICD-10-CM | POA: Diagnosis not present

## 2016-06-16 DIAGNOSIS — E669 Obesity, unspecified: Secondary | ICD-10-CM | POA: Diagnosis not present

## 2016-06-17 DIAGNOSIS — E669 Obesity, unspecified: Secondary | ICD-10-CM | POA: Diagnosis not present

## 2016-06-18 DIAGNOSIS — E669 Obesity, unspecified: Secondary | ICD-10-CM | POA: Diagnosis not present

## 2016-06-19 DIAGNOSIS — E669 Obesity, unspecified: Secondary | ICD-10-CM | POA: Diagnosis not present

## 2016-06-20 DIAGNOSIS — E669 Obesity, unspecified: Secondary | ICD-10-CM | POA: Diagnosis not present

## 2016-06-21 DIAGNOSIS — E669 Obesity, unspecified: Secondary | ICD-10-CM | POA: Diagnosis not present

## 2016-06-22 ENCOUNTER — Other Ambulatory Visit: Payer: Self-pay | Admitting: Internal Medicine

## 2016-06-22 DIAGNOSIS — J9610 Chronic respiratory failure, unspecified whether with hypoxia or hypercapnia: Secondary | ICD-10-CM | POA: Diagnosis not present

## 2016-06-22 DIAGNOSIS — E669 Obesity, unspecified: Secondary | ICD-10-CM | POA: Diagnosis not present

## 2016-06-22 DIAGNOSIS — J449 Chronic obstructive pulmonary disease, unspecified: Secondary | ICD-10-CM | POA: Diagnosis not present

## 2016-06-23 ENCOUNTER — Ambulatory Visit: Payer: Commercial Managed Care - HMO | Admitting: Internal Medicine

## 2016-06-23 DIAGNOSIS — E669 Obesity, unspecified: Secondary | ICD-10-CM | POA: Diagnosis not present

## 2016-06-24 ENCOUNTER — Other Ambulatory Visit: Payer: Self-pay | Admitting: Internal Medicine

## 2016-06-24 DIAGNOSIS — E669 Obesity, unspecified: Secondary | ICD-10-CM | POA: Diagnosis not present

## 2016-06-25 DIAGNOSIS — E669 Obesity, unspecified: Secondary | ICD-10-CM | POA: Diagnosis not present

## 2016-06-29 DIAGNOSIS — E669 Obesity, unspecified: Secondary | ICD-10-CM | POA: Diagnosis not present

## 2016-06-30 DIAGNOSIS — E669 Obesity, unspecified: Secondary | ICD-10-CM | POA: Diagnosis not present

## 2016-07-01 DIAGNOSIS — E669 Obesity, unspecified: Secondary | ICD-10-CM | POA: Diagnosis not present

## 2016-07-02 DIAGNOSIS — E669 Obesity, unspecified: Secondary | ICD-10-CM | POA: Diagnosis not present

## 2016-07-03 DIAGNOSIS — E669 Obesity, unspecified: Secondary | ICD-10-CM | POA: Diagnosis not present

## 2016-07-04 DIAGNOSIS — E669 Obesity, unspecified: Secondary | ICD-10-CM | POA: Diagnosis not present

## 2016-07-06 DIAGNOSIS — E669 Obesity, unspecified: Secondary | ICD-10-CM | POA: Diagnosis not present

## 2016-07-07 DIAGNOSIS — E669 Obesity, unspecified: Secondary | ICD-10-CM | POA: Diagnosis not present

## 2016-07-08 DIAGNOSIS — E669 Obesity, unspecified: Secondary | ICD-10-CM | POA: Diagnosis not present

## 2016-07-08 NOTE — Progress Notes (Signed)
Subjective:    Patient ID: Buena Irish, female    DOB: 1933/01/26, 83 y.o.   MRN: [id number]  HPI The patient is here for follow up.  Hypertension: at her last visit her BP was elevated.  We decreased her lasix to 20 mg once daily.  We adding norvasc to 2.5 mg daily.  She is taking her medication daily. She is compliant with a low sodium diet.  She denies chest pain, palpitations, edema, shortness of breath and regular headaches. She is exercising  - she walks the hallways.  She does monitor her blood pressure at home  -128/62, 128/70.     CKD, edema: We decreased her lasix to 20 mg daily one month ago due to her not having any edema.    She denies any edema in her legs and denies SOB.     Dry cough:  It started a couple of weeks ago.  Her throat feels dry and she feels a tickle in her throat.  She clears her throat or coughs .  She denies GERD.   She denies fever, sore throat, trouble swallowing, SOB and wheeze.    Medications and allergies reviewed with patient and updated if appropriate.  Patient Active Problem List   Diagnosis Date Noted  . CKD (chronic kidney disease) stage 3, GFR 30-59 ml/min 06/08/2016  . Urinary incontinence 05/13/2016  . Cramp of both lower extremities 12/05/2015  . Obstructive sleep apnea 09/15/2014  . Osteoporosis 07/15/2014  . Arthritis pain of hand 07/10/2014  . Anemia, iron deficiency 07/10/2014  . Deep vein thrombosis ([place]) 12/24/2013  . Chronic respiratory failure with hypercapnia ([place]) 12/21/2013  . Post-surgical hypothyroidism 12/21/2013  . Hypoxemia 12/20/2013  . Edema 12/12/2013  . Fracture, subtrochanteric, right femur, closed ([place]) 09/22/2013  . Chronic radicular low back pain 12/30/2012  . Osteoarthritis of right knee 12/30/2012  . Hyperlipidemia   . Hypertension     Current Outpatient Prescriptions on File Prior to Visit  Medication Sig Dispense Refill  . amLODipine (NORVASC) 2.5 MG tablet Take 1 tablet (2.5 mg total) by mouth  daily. 90 tablet 3  . aspirin 81 MG tablet Take 1 tablet (81 mg total) by mouth daily. 30 tablet   . atorvastatin (LIPITOR) 10 MG tablet TAKE 1 TABLET BY MOUTH EVERY EVENING 90 tablet 3  . calcitRIOL (ROCALTROL) 0.25 MCG capsule Take 1 capsule (0.25 mcg total) by mouth 2 (two) times daily. 180 capsule 3  . calcium carbonate (OS-CAL) 600 MG TABS tablet Take 1 tablet (600 mg total) by mouth 2 (two) times daily with a meal. 180 tablet 3  . famotidine (PEPCID) 20 MG tablet Take 1 tablet (20 mg total) by mouth at bedtime. 90 tablet 3  . furosemide (LASIX) 20 MG tablet Take 1 tablet (20 mg total) by mouth daily. 90 tablet 3  . levothyroxine (SYNTHROID, LEVOTHROID) 125 MCG tablet Take 1 tablet (125 mcg total) by mouth daily. 90 tablet 1  . metoprolol tartrate (LOPRESSOR) 25 MG tablet Take 0.5 tablets (12.5 mg total) by mouth 2 (two) times daily. 90 tablet 3  . nystatin (MYCOSTATIN/NYSTOP) 100000 UNIT/GM POWD Apply to affected area 3 times a day 60 g 0  . sulindac (CLINORIL) 200 MG tablet Take 1 tablet (200 mg total) by mouth 2 (two) times daily. 180 tablet 3  . traMADol (ULTRAM) 50 MG tablet Take 1 tablet (50 mg total) by mouth every 8 (eight) hours as needed. 30 tablet 0  . valsartan (DIOVAN) 160  MG tablet TAKE 1 TABLET BY MOUTH EVERY DAY 90 tablet 2  . Vitamin D, Ergocalciferol, (DRISDOL) 50000 UNITS CAPS capsule Take 50,000 Units by mouth.     No current facility-administered medications on file prior to visit.     Past Medical History:  Diagnosis Date  . Arthritis   . DVT (deep venous thrombosis) ([place]) 12/2013   bilateral upper extremity DVT found at time of thyroidectomy   . History of kidney stones   . Hyperlipidemia   . Hypertension   . Hypothyroidism (acquired) 12/2013   s/p thyroidectomy 12/21/13 at [facility]  . Osteoporosis 07/15/2014   DEXA @ [facility] 07/14/14: -3.1  . Thyroid goiter    s/p thyroidectomy - pathology demonstrated nodular hyperplasia    Past Surgical History:  Procedure  Laterality Date  . FEMUR IM NAIL Right 09/22/2013   Procedure: INTRAMEDULLARY (IM) NAIL FEMORAL;  Surgeon: Wylene Simmer, MD;  Location: [place];  Service: Orthopedics;  Laterality: Right;  . HIP SURGERY    . THYROIDECTOMY WITH STERNOTOMY  12/21/13   thyroid mass found incidentally on CT scan 12/2013; transferred to [facility] for surgical removal of thyroid and goiter requiring sternotomy    Social History   Social History  . Marital status: Widowed    Spouse name: N/A  . Number of children: N/A  . Years of education: N/A   Occupational History  . retired    Social History Main Topics  . Smoking status: Never Smoker  . Smokeless tobacco: None     Comment: lives w/ dtr dorthea, 2nd dtr and 3 sons in town  . Alcohol use No  . Drug use: No  . Sexual activity: Not Asked   Other Topics Concern  . 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   Lives with daughter dorthea - has 3 sons and another daughter in town, supportive    Family History  Problem Relation Age of Onset  . Osteoarthritis Mother   . Hypertension Mother   . Hypertension Father   . Cancer Father     unknown type  . Diabetes Son   . Diabetes Son   . Clotting disorder    . Rheumatologic disease      Review of Systems  Constitutional: Negative for chills and fever.  HENT: Positive for rhinorrhea. Negative for postnasal drip, sore throat and trouble swallowing.   Respiratory: Positive for cough. Negative for shortness of breath and wheezing.   Cardiovascular: Negative for chest pain, palpitations and leg swelling.  Gastrointestinal:       No gerd  Neurological: Negative for light-headedness and headaches.       Objective:   Vitals:   07/09/16 0947 07/09/16 0959  BP: (!) 168/76 (!) 142/64  Pulse: 75   Resp: 18   Temp: 98.6 F (37 C)    Wt Readings from Last 3 Encounters:  07/09/16 258 lb (117 kg)  06/08/16 257 lb  (116.6 kg)  12/05/15 256 lb (116.1 kg)   Body mass index is 47.19 kg/m.   Physical Exam    Constitutional: Appears well-developed and well-nourished. No distress.  HENT:  Head: Normocephalic and atraumatic.  Neck: Neck supple. No tracheal deviation present. No thyromegaly present.  No cervical lymphadenopathy, oropharynx clear without erythema or exudate Cardiovascular: Normal rate, regular rhythm and normal heart sounds.   No murmur heard. No carotid bruit .  Mild b/l le edema  Pulmonary/Chest: Effort normal and breath sounds normal. No respiratory distress. No has no wheezes. No rales.  Skin: Skin is warm and dry. Not diaphoretic.  Psychiatric: Normal mood and affect. Behavior is normal.      Assessment & Plan:    See Problem List for Assessment and Plan of chronic medical problems.   

## 2016-07-09 ENCOUNTER — Encounter: Payer: Self-pay | Admitting: Internal Medicine

## 2016-07-09 ENCOUNTER — Ambulatory Visit (INDEPENDENT_AMBULATORY_CARE_PROVIDER_SITE_OTHER): Payer: Commercial Managed Care - HMO | Admitting: Internal Medicine

## 2016-07-09 VITALS — BP 142/64 | HR 75 | Temp 98.6°F | Resp 18 | Wt 258.0 lb

## 2016-07-09 DIAGNOSIS — R05 Cough: Secondary | ICD-10-CM | POA: Diagnosis not present

## 2016-07-09 DIAGNOSIS — N183 Chronic kidney disease, stage 3 unspecified: Secondary | ICD-10-CM

## 2016-07-09 DIAGNOSIS — R600 Localized edema: Secondary | ICD-10-CM | POA: Diagnosis not present

## 2016-07-09 DIAGNOSIS — I10 Essential (primary) hypertension: Secondary | ICD-10-CM

## 2016-07-09 DIAGNOSIS — R059 Cough, unspecified: Secondary | ICD-10-CM

## 2016-07-09 NOTE — Progress Notes (Signed)
Pre visit review using our clinic review tool, if applicable. No additional management support is needed unless otherwise documented below in the visit note. 

## 2016-07-09 NOTE — Patient Instructions (Addendum)
  No immunizations administered today.   Medications reviewed and updated.  No changes recommended at this time.    Please followup in 6 months   

## 2016-07-10 DIAGNOSIS — R059 Cough, unspecified: Secondary | ICD-10-CM | POA: Insufficient documentation

## 2016-07-10 DIAGNOSIS — E669 Obesity, unspecified: Secondary | ICD-10-CM | POA: Diagnosis not present

## 2016-07-10 DIAGNOSIS — R05 Cough: Secondary | ICD-10-CM | POA: Insufficient documentation

## 2016-07-10 NOTE — Assessment & Plan Note (Signed)
CKD has been stable - will recheck cmp at her next visit

## 2016-07-10 NOTE — Assessment & Plan Note (Signed)
BP slightly high here today  - better on repeat Seems to be well controlled at home Will continue to monitor Continue current medication at current dose

## 2016-07-10 NOTE — Assessment & Plan Note (Signed)
Minimal edema in b/l legs - she did not know she had any swelling and it does not bother her.  No SOB Continue lasix at 20 mg once daily Low sodium diet F/u in a few months, sooner if needed

## 2016-07-10 NOTE — Assessment & Plan Note (Signed)
Dry cough She denies GERD, likely related to postnasal drip Discussed that she can try Claritin if she wants, but if symptoms mild enough would not take anything

## 2016-07-11 DIAGNOSIS — M5416 Radiculopathy, lumbar region: Secondary | ICD-10-CM | POA: Diagnosis not present

## 2016-07-11 DIAGNOSIS — J9610 Chronic respiratory failure, unspecified whether with hypoxia or hypercapnia: Secondary | ICD-10-CM | POA: Diagnosis not present

## 2016-07-11 DIAGNOSIS — M6281 Muscle weakness (generalized): Secondary | ICD-10-CM | POA: Diagnosis not present

## 2016-07-11 DIAGNOSIS — G8929 Other chronic pain: Secondary | ICD-10-CM | POA: Diagnosis not present

## 2016-07-12 DIAGNOSIS — E669 Obesity, unspecified: Secondary | ICD-10-CM | POA: Diagnosis not present

## 2016-07-13 DIAGNOSIS — E669 Obesity, unspecified: Secondary | ICD-10-CM | POA: Diagnosis not present

## 2016-07-14 DIAGNOSIS — E669 Obesity, unspecified: Secondary | ICD-10-CM | POA: Diagnosis not present

## 2016-07-15 DIAGNOSIS — E669 Obesity, unspecified: Secondary | ICD-10-CM | POA: Diagnosis not present

## 2016-07-16 DIAGNOSIS — E669 Obesity, unspecified: Secondary | ICD-10-CM | POA: Diagnosis not present

## 2016-07-17 DIAGNOSIS — E669 Obesity, unspecified: Secondary | ICD-10-CM | POA: Diagnosis not present

## 2016-07-20 DIAGNOSIS — E669 Obesity, unspecified: Secondary | ICD-10-CM | POA: Diagnosis not present

## 2016-07-22 ENCOUNTER — Other Ambulatory Visit: Payer: Self-pay | Admitting: Internal Medicine

## 2016-07-23 DIAGNOSIS — J449 Chronic obstructive pulmonary disease, unspecified: Secondary | ICD-10-CM | POA: Diagnosis not present

## 2016-07-23 DIAGNOSIS — J9610 Chronic respiratory failure, unspecified whether with hypoxia or hypercapnia: Secondary | ICD-10-CM | POA: Diagnosis not present

## 2016-07-24 DIAGNOSIS — E669 Obesity, unspecified: Secondary | ICD-10-CM | POA: Diagnosis not present

## 2016-07-25 DIAGNOSIS — E669 Obesity, unspecified: Secondary | ICD-10-CM | POA: Diagnosis not present

## 2016-07-27 DIAGNOSIS — E669 Obesity, unspecified: Secondary | ICD-10-CM | POA: Diagnosis not present

## 2016-07-28 DIAGNOSIS — E669 Obesity, unspecified: Secondary | ICD-10-CM | POA: Diagnosis not present

## 2016-07-29 DIAGNOSIS — E669 Obesity, unspecified: Secondary | ICD-10-CM | POA: Diagnosis not present

## 2016-07-30 DIAGNOSIS — E669 Obesity, unspecified: Secondary | ICD-10-CM | POA: Diagnosis not present

## 2016-07-31 DIAGNOSIS — E669 Obesity, unspecified: Secondary | ICD-10-CM | POA: Diagnosis not present

## 2016-08-01 DIAGNOSIS — E669 Obesity, unspecified: Secondary | ICD-10-CM | POA: Diagnosis not present

## 2016-08-02 DIAGNOSIS — E669 Obesity, unspecified: Secondary | ICD-10-CM | POA: Diagnosis not present

## 2016-08-03 DIAGNOSIS — E669 Obesity, unspecified: Secondary | ICD-10-CM | POA: Diagnosis not present

## 2016-08-05 DIAGNOSIS — E669 Obesity, unspecified: Secondary | ICD-10-CM | POA: Diagnosis not present

## 2016-08-06 DIAGNOSIS — E669 Obesity, unspecified: Secondary | ICD-10-CM | POA: Diagnosis not present

## 2016-08-07 DIAGNOSIS — E669 Obesity, unspecified: Secondary | ICD-10-CM | POA: Diagnosis not present

## 2016-08-09 DIAGNOSIS — E669 Obesity, unspecified: Secondary | ICD-10-CM | POA: Diagnosis not present

## 2016-08-10 DIAGNOSIS — E669 Obesity, unspecified: Secondary | ICD-10-CM | POA: Diagnosis not present

## 2016-08-11 DIAGNOSIS — M6281 Muscle weakness (generalized): Secondary | ICD-10-CM | POA: Diagnosis not present

## 2016-08-11 DIAGNOSIS — M5416 Radiculopathy, lumbar region: Secondary | ICD-10-CM | POA: Diagnosis not present

## 2016-08-11 DIAGNOSIS — J9610 Chronic respiratory failure, unspecified whether with hypoxia or hypercapnia: Secondary | ICD-10-CM | POA: Diagnosis not present

## 2016-08-11 DIAGNOSIS — G8929 Other chronic pain: Secondary | ICD-10-CM | POA: Diagnosis not present

## 2016-08-11 DIAGNOSIS — E669 Obesity, unspecified: Secondary | ICD-10-CM | POA: Diagnosis not present

## 2016-08-12 DIAGNOSIS — E669 Obesity, unspecified: Secondary | ICD-10-CM | POA: Diagnosis not present

## 2016-08-13 DIAGNOSIS — E669 Obesity, unspecified: Secondary | ICD-10-CM | POA: Diagnosis not present

## 2016-08-14 DIAGNOSIS — E669 Obesity, unspecified: Secondary | ICD-10-CM | POA: Diagnosis not present

## 2016-08-15 DIAGNOSIS — E669 Obesity, unspecified: Secondary | ICD-10-CM | POA: Diagnosis not present

## 2016-08-16 ENCOUNTER — Telehealth: Payer: Self-pay

## 2016-08-16 DIAGNOSIS — E669 Obesity, unspecified: Secondary | ICD-10-CM | POA: Diagnosis not present

## 2016-08-16 NOTE — Telephone Encounter (Signed)
Advised patient that prolia injection has been verified by insurance, her summary of benefits states she owes estimated $200 copay---can get injection on/after 08/13/16----patient has scheduled appt for 2/13

## 2016-08-17 ENCOUNTER — Ambulatory Visit (INDEPENDENT_AMBULATORY_CARE_PROVIDER_SITE_OTHER): Payer: Medicare HMO | Admitting: General Practice

## 2016-08-17 DIAGNOSIS — E669 Obesity, unspecified: Secondary | ICD-10-CM | POA: Diagnosis not present

## 2016-08-17 DIAGNOSIS — M810 Age-related osteoporosis without current pathological fracture: Secondary | ICD-10-CM | POA: Diagnosis not present

## 2016-08-17 MED ORDER — DENOSUMAB 60 MG/ML ~~LOC~~ SOLN
60.0000 mg | Freq: Once | SUBCUTANEOUS | Status: AC
  Administered 2016-08-17: 60 mg via SUBCUTANEOUS

## 2016-08-17 NOTE — Progress Notes (Signed)
Prolia injection given  Stacy J Burns, MD  

## 2016-08-18 DIAGNOSIS — E669 Obesity, unspecified: Secondary | ICD-10-CM | POA: Diagnosis not present

## 2016-08-19 DIAGNOSIS — E669 Obesity, unspecified: Secondary | ICD-10-CM | POA: Diagnosis not present

## 2016-08-20 DIAGNOSIS — E669 Obesity, unspecified: Secondary | ICD-10-CM | POA: Diagnosis not present

## 2016-08-23 DIAGNOSIS — J449 Chronic obstructive pulmonary disease, unspecified: Secondary | ICD-10-CM | POA: Diagnosis not present

## 2016-08-23 DIAGNOSIS — E669 Obesity, unspecified: Secondary | ICD-10-CM | POA: Diagnosis not present

## 2016-08-23 DIAGNOSIS — J9610 Chronic respiratory failure, unspecified whether with hypoxia or hypercapnia: Secondary | ICD-10-CM | POA: Diagnosis not present

## 2016-08-24 DIAGNOSIS — E669 Obesity, unspecified: Secondary | ICD-10-CM | POA: Diagnosis not present

## 2016-08-25 DIAGNOSIS — E669 Obesity, unspecified: Secondary | ICD-10-CM | POA: Diagnosis not present

## 2016-08-26 DIAGNOSIS — E669 Obesity, unspecified: Secondary | ICD-10-CM | POA: Diagnosis not present

## 2016-08-27 DIAGNOSIS — E669 Obesity, unspecified: Secondary | ICD-10-CM | POA: Diagnosis not present

## 2016-08-28 DIAGNOSIS — E669 Obesity, unspecified: Secondary | ICD-10-CM | POA: Diagnosis not present

## 2016-08-30 DIAGNOSIS — E669 Obesity, unspecified: Secondary | ICD-10-CM | POA: Diagnosis not present

## 2016-08-31 DIAGNOSIS — E669 Obesity, unspecified: Secondary | ICD-10-CM | POA: Diagnosis not present

## 2016-09-01 DIAGNOSIS — E669 Obesity, unspecified: Secondary | ICD-10-CM | POA: Diagnosis not present

## 2016-09-02 DIAGNOSIS — E669 Obesity, unspecified: Secondary | ICD-10-CM | POA: Diagnosis not present

## 2016-09-03 DIAGNOSIS — E669 Obesity, unspecified: Secondary | ICD-10-CM | POA: Diagnosis not present

## 2016-09-04 DIAGNOSIS — E669 Obesity, unspecified: Secondary | ICD-10-CM | POA: Diagnosis not present

## 2016-09-05 DIAGNOSIS — E669 Obesity, unspecified: Secondary | ICD-10-CM | POA: Diagnosis not present

## 2016-09-06 DIAGNOSIS — E669 Obesity, unspecified: Secondary | ICD-10-CM | POA: Diagnosis not present

## 2016-09-07 DIAGNOSIS — E669 Obesity, unspecified: Secondary | ICD-10-CM | POA: Diagnosis not present

## 2016-09-08 DIAGNOSIS — E669 Obesity, unspecified: Secondary | ICD-10-CM | POA: Diagnosis not present

## 2016-09-08 DIAGNOSIS — M5416 Radiculopathy, lumbar region: Secondary | ICD-10-CM | POA: Diagnosis not present

## 2016-09-08 DIAGNOSIS — J9610 Chronic respiratory failure, unspecified whether with hypoxia or hypercapnia: Secondary | ICD-10-CM | POA: Diagnosis not present

## 2016-09-08 DIAGNOSIS — G8929 Other chronic pain: Secondary | ICD-10-CM | POA: Diagnosis not present

## 2016-09-08 DIAGNOSIS — M6281 Muscle weakness (generalized): Secondary | ICD-10-CM | POA: Diagnosis not present

## 2016-09-09 DIAGNOSIS — E669 Obesity, unspecified: Secondary | ICD-10-CM | POA: Diagnosis not present

## 2016-09-13 DIAGNOSIS — E669 Obesity, unspecified: Secondary | ICD-10-CM | POA: Diagnosis not present

## 2016-09-15 DIAGNOSIS — E669 Obesity, unspecified: Secondary | ICD-10-CM | POA: Diagnosis not present

## 2016-09-16 DIAGNOSIS — E669 Obesity, unspecified: Secondary | ICD-10-CM | POA: Diagnosis not present

## 2016-09-17 DIAGNOSIS — E669 Obesity, unspecified: Secondary | ICD-10-CM | POA: Diagnosis not present

## 2016-09-18 DIAGNOSIS — E669 Obesity, unspecified: Secondary | ICD-10-CM | POA: Diagnosis not present

## 2016-09-19 DIAGNOSIS — E669 Obesity, unspecified: Secondary | ICD-10-CM | POA: Diagnosis not present

## 2016-09-20 DIAGNOSIS — J449 Chronic obstructive pulmonary disease, unspecified: Secondary | ICD-10-CM | POA: Diagnosis not present

## 2016-09-20 DIAGNOSIS — E669 Obesity, unspecified: Secondary | ICD-10-CM | POA: Diagnosis not present

## 2016-09-20 DIAGNOSIS — J9610 Chronic respiratory failure, unspecified whether with hypoxia or hypercapnia: Secondary | ICD-10-CM | POA: Diagnosis not present

## 2016-09-21 DIAGNOSIS — E669 Obesity, unspecified: Secondary | ICD-10-CM | POA: Diagnosis not present

## 2016-09-22 DIAGNOSIS — E669 Obesity, unspecified: Secondary | ICD-10-CM | POA: Diagnosis not present

## 2016-09-23 DIAGNOSIS — E669 Obesity, unspecified: Secondary | ICD-10-CM | POA: Diagnosis not present

## 2016-09-24 DIAGNOSIS — E669 Obesity, unspecified: Secondary | ICD-10-CM | POA: Diagnosis not present

## 2016-09-25 DIAGNOSIS — E669 Obesity, unspecified: Secondary | ICD-10-CM | POA: Diagnosis not present

## 2016-09-27 DIAGNOSIS — E669 Obesity, unspecified: Secondary | ICD-10-CM | POA: Diagnosis not present

## 2016-09-28 DIAGNOSIS — E669 Obesity, unspecified: Secondary | ICD-10-CM | POA: Diagnosis not present

## 2016-09-29 DIAGNOSIS — E669 Obesity, unspecified: Secondary | ICD-10-CM | POA: Diagnosis not present

## 2016-09-30 DIAGNOSIS — E669 Obesity, unspecified: Secondary | ICD-10-CM | POA: Diagnosis not present

## 2016-10-01 DIAGNOSIS — E669 Obesity, unspecified: Secondary | ICD-10-CM | POA: Diagnosis not present

## 2016-10-02 DIAGNOSIS — E669 Obesity, unspecified: Secondary | ICD-10-CM | POA: Diagnosis not present

## 2016-10-03 DIAGNOSIS — E669 Obesity, unspecified: Secondary | ICD-10-CM | POA: Diagnosis not present

## 2016-10-04 DIAGNOSIS — E669 Obesity, unspecified: Secondary | ICD-10-CM | POA: Diagnosis not present

## 2016-10-05 DIAGNOSIS — E669 Obesity, unspecified: Secondary | ICD-10-CM | POA: Diagnosis not present

## 2016-10-06 DIAGNOSIS — E669 Obesity, unspecified: Secondary | ICD-10-CM | POA: Diagnosis not present

## 2016-10-07 DIAGNOSIS — E669 Obesity, unspecified: Secondary | ICD-10-CM | POA: Diagnosis not present

## 2016-10-08 DIAGNOSIS — E669 Obesity, unspecified: Secondary | ICD-10-CM | POA: Diagnosis not present

## 2016-10-09 DIAGNOSIS — M5416 Radiculopathy, lumbar region: Secondary | ICD-10-CM | POA: Diagnosis not present

## 2016-10-09 DIAGNOSIS — G8929 Other chronic pain: Secondary | ICD-10-CM | POA: Diagnosis not present

## 2016-10-09 DIAGNOSIS — M6281 Muscle weakness (generalized): Secondary | ICD-10-CM | POA: Diagnosis not present

## 2016-10-09 DIAGNOSIS — J9610 Chronic respiratory failure, unspecified whether with hypoxia or hypercapnia: Secondary | ICD-10-CM | POA: Diagnosis not present

## 2016-10-09 DIAGNOSIS — E669 Obesity, unspecified: Secondary | ICD-10-CM | POA: Diagnosis not present

## 2016-10-10 DIAGNOSIS — E669 Obesity, unspecified: Secondary | ICD-10-CM | POA: Diagnosis not present

## 2016-10-11 DIAGNOSIS — E669 Obesity, unspecified: Secondary | ICD-10-CM | POA: Diagnosis not present

## 2016-10-12 DIAGNOSIS — E669 Obesity, unspecified: Secondary | ICD-10-CM | POA: Diagnosis not present

## 2016-10-13 DIAGNOSIS — E669 Obesity, unspecified: Secondary | ICD-10-CM | POA: Diagnosis not present

## 2016-10-14 DIAGNOSIS — E669 Obesity, unspecified: Secondary | ICD-10-CM | POA: Diagnosis not present

## 2016-10-15 ENCOUNTER — Encounter: Payer: Self-pay | Admitting: Nurse Practitioner

## 2016-10-15 ENCOUNTER — Ambulatory Visit (INDEPENDENT_AMBULATORY_CARE_PROVIDER_SITE_OTHER): Payer: Medicare HMO | Admitting: Nurse Practitioner

## 2016-10-15 VITALS — BP 160/70 | HR 62 | Temp 97.9°F | Ht 62.0 in | Wt 261.0 lb

## 2016-10-15 DIAGNOSIS — E669 Obesity, unspecified: Secondary | ICD-10-CM | POA: Diagnosis not present

## 2016-10-15 DIAGNOSIS — R600 Localized edema: Secondary | ICD-10-CM | POA: Diagnosis not present

## 2016-10-15 MED ORDER — FUROSEMIDE 20 MG PO TABS: 40.0000 mg | ORAL_TABLET | Freq: Every day | ORAL | 0 refills | Status: DC

## 2016-10-15 NOTE — Patient Instructions (Addendum)
Increase lasix to 40mg  once a day x 5days, then decrease to 20mg  once a day continuously.  Elevate legs as much as possible   Maintain low salt diet.  Monitor BP at home. If persistently greater than 150/90, call office  Edema Edema is an abnormal buildup of fluids in your bodytissues. Edema is somewhatdependent on gravity to pull the fluid to the lowest place in your body. That makes the condition more common in the legs and thighs (lower extremities). Painless swelling of the feet and ankles is common and becomes more likely as you get older. It is also common in looser tissues, like around your eyes. When the affected area is squeezed, the fluid may move out of that spot and leave a dent for a few moments. This dent is called pitting. What are the causes? There are many possible causes of edema. Eating too much salt and being on your feet or sitting for a long time can cause edema in your legs and ankles. Hot weather may make edema worse. Common medical causes of edema include:  Heart failure.  Liver disease.  Kidney disease.  Weak blood vessels in your legs.  Cancer.  An injury.  Pregnancy.  Some medications.  Obesity. What are the signs or symptoms? Edema is usually painless.Your skin may look swollen or shiny. How is this diagnosed? Your health care provider may be able to diagnose edema by asking about your medical history and doing a physical exam. You may need to have tests such as X-rays, an electrocardiogram, or blood tests to check for medical conditions that may cause edema. How is this treated? Edema treatment depends on the cause. If you have heart, liver, or kidney disease, you need the treatment appropriate for these conditions. General treatment may include:  Elevation of the affected body part above the level of your heart.  Compression of the affected body part. Pressure from elastic bandages or support stockings squeezes the tissues and forces fluid  back into the blood vessels. This keeps fluid from entering the tissues.  Restriction of fluid and salt intake.  Use of a water pill (diuretic). These medications are appropriate only for some types of edema. They pull fluid out of your body and make you urinate more often. This gets rid of fluid and reduces swelling, but diuretics can have side effects. Only use diuretics as directed by your health care provider. Follow these instructions at home:  Keep the affected body part above the level of your heart when you are lying down.  Do not sit still or stand for prolonged periods.  Do not put anything directly under your knees when lying down.  Do not wear constricting clothing or garters on your upper legs.  Exercise your legs to work the fluid back into your blood vessels. This may help the swelling go down.  Wear elastic bandages or support stockings to reduce ankle swelling as directed by your health care provider.  Eat a low-salt diet to reduce fluid if your health care provider recommends it.  Only take medicines as directed by your health care provider. Contact a health care provider if:  Your edema is not responding to treatment.  You have heart, liver, or kidney disease and notice symptoms of edema.  You have edema in your legs that does not improve after elevating them.  You have sudden and unexplained weight gain. Get help right away if:  You develop shortness of breath or chest pain.  You cannot breathe  when you lie down.  You develop pain, redness, or warmth in the swollen areas.  You have heart, liver, or kidney disease and suddenly get edema.  You have a fever and your symptoms suddenly get worse. This information is not intended to replace advice given to you by your health care provider. Make sure you discuss any questions you have with your health care provider. Document Released: 06/21/2005 Document Revised: 11/27/2015 Document Reviewed:  04/13/2013 Elsevier Interactive Patient Education  2017 [employer].

## 2016-10-15 NOTE — Progress Notes (Signed)
Subjective:  Patient ID: Shawna Fuentes, female    DOB: 11/06/1932  Age: 83 y.o. MRN: [id number]  CC: Leg Swelling (both legs swelling for 1 wk. fluid?)  HPI  EDEMA: Shawna Fuentes in office with son. She complains of increasing LE edema since decrease in lasix to 20mg . She denies any SOB or cough or ABD fullness or PND. She has also noticed weight gain.  HTN: Took BP medications about 70mins ago.  Outpatient Medications Prior to Visit  Medication Sig Dispense Refill  . amLODipine (NORVASC) 2.5 MG tablet Take 1 tablet (2.5 mg total) by mouth daily. 90 tablet 3  . aspirin 81 MG tablet Take 1 tablet (81 mg total) by mouth daily. 30 tablet   . atorvastatin (LIPITOR) 10 MG tablet TAKE 1 TABLET BY MOUTH EVERY EVENING 90 tablet 3  . calcitRIOL (ROCALTROL) 0.25 MCG capsule Take 1 capsule (0.25 mcg total) by mouth 2 (two) times daily. 180 capsule 3  . calcitRIOL (ROCALTROL) 0.25 MCG capsule TAKE 1 CAPSULE (0.25 MCG TOTAL) BY MOUTH 2 (TWO) TIMES DAILY. 180 capsule 1  . calcium carbonate (OS-CAL) 600 MG TABS tablet Take 1 tablet (600 mg total) by mouth 2 (two) times daily with a meal. 180 tablet 3  . famotidine (PEPCID) 20 MG tablet Take 1 tablet (20 mg total) by mouth at bedtime. 90 tablet 3  . levothyroxine (SYNTHROID, LEVOTHROID) 125 MCG tablet Take 1 tablet (125 mcg total) by mouth daily. 90 tablet 1  . metoprolol tartrate (LOPRESSOR) 25 MG tablet Take 0.5 tablets (12.5 mg total) by mouth 2 (two) times daily. 90 tablet 3  . nystatin (MYCOSTATIN/NYSTOP) 100000 UNIT/GM POWD Apply to affected area 3 times a day 60 g 0  . sulindac (CLINORIL) 200 MG tablet Take 1 tablet (200 mg total) by mouth 2 (two) times daily. 180 tablet 3  . traMADol (ULTRAM) 50 MG tablet Take 1 tablet (50 mg total) by mouth every 8 (eight) hours as needed. 30 tablet 0  . valsartan (DIOVAN) 160 MG tablet TAKE 1 TABLET BY MOUTH EVERY DAY 90 tablet 2  . Vitamin D, Ergocalciferol, (DRISDOL) 50000 UNITS CAPS capsule Take 50,000  Units by mouth.    . furosemide (LASIX) 20 MG tablet Take 1 tablet (20 mg total) by mouth daily. 90 tablet 3   No facility-administered medications prior to visit.     ROS See HPI  Objective:  BP (!) 160/70   Pulse 62   Temp 97.9 F (36.6 C)   Ht 5\' 2"  (1.575 m)   Wt 261 lb (118.4 kg)   SpO2 99%   BMI 47.74 kg/m   BP Readings from Last 3 Encounters:  10/15/16 (!) 160/70  07/09/16 (!) 142/64  06/08/16 (!) 160/80    Wt Readings from Last 3 Encounters:  10/15/16 261 lb (118.4 kg)  07/09/16 258 lb (117 kg)  06/08/16 257 lb (116.6 kg)    Physical Exam  Constitutional: She is oriented to person, place, and time. No distress.  Cardiovascular: Normal rate and normal heart sounds.   Pulmonary/Chest: Effort normal and breath sounds normal. She has no wheezes. She has no rales.  Abdominal: Soft. Bowel sounds are normal. She exhibits no distension. There is no tenderness.  Musculoskeletal: She exhibits edema. She exhibits no tenderness.  Neurological: She is alert and oriented to person, place, and time.  Ambulates with walker  Skin: Skin is warm and dry. No rash noted. No erythema.  Vitals reviewed.   Lab Results  Component  Value Date   WBC 7.3 12/05/2015   HGB 12.4 12/05/2015   HCT 38.0 12/05/2015   PLT 178.0 12/05/2015   GLUCOSE 102 (H) 06/08/2016   CHOL 162 04/09/2014   TRIG 67.0 04/09/2014   HDL 63.70 04/09/2014   LDLCALC 85 04/09/2014   ALT 10 06/08/2016   AST 15 06/08/2016   NA 142 06/08/2016   K 4.7 06/08/2016   CL 103 06/08/2016   CREATININE 1.36 (H) 06/08/2016   BUN 29 (H) 06/08/2016   CO2 30 06/08/2016   TSH 0.10 (L) 06/08/2016   INR 2.2 03/21/2014   HGBA1C 5.7 (H) 09/23/2013    No results found.  Assessment & Plan:   Shawna Fuentes was seen today for leg swelling.  Diagnoses and all orders for this visit:  Localized edema -     furosemide (LASIX) 20 MG tablet; Take 2 tablets (40 mg total) by mouth daily.   I have discontinued Shawna Fuentes's  furosemide. I have also changed her furosemide. Additionally, I am having her maintain her aspirin, nystatin, Vitamin D (Ergocalciferol), traMADol, famotidine, sulindac, metoprolol tartrate, calcium carbonate, calcitRIOL, amLODipine, levothyroxine, atorvastatin, valsartan, and calcitRIOL.  Meds ordered this encounter  Medications  . DISCONTD: furosemide (LASIX) 40 MG tablet    Sig: Take 40 mg by mouth.  . furosemide (LASIX) 20 MG tablet    Sig: Take 2 tablets (40 mg total) by mouth daily.    Dispense:  10 tablet    Refill:  0    Order Specific Question:   Supervising Provider    Answer:   Cassandria Anger [[id number]]    Follow-up: Return if symptoms worsen or fail to improve.  Wilfred Lacy, NP

## 2016-10-15 NOTE — Progress Notes (Signed)
Pre visit review using our clinic review tool, if applicable. No additional management support is needed unless otherwise documented below in the visit note. 

## 2016-10-16 DIAGNOSIS — E669 Obesity, unspecified: Secondary | ICD-10-CM | POA: Diagnosis not present

## 2016-10-17 DIAGNOSIS — E669 Obesity, unspecified: Secondary | ICD-10-CM | POA: Diagnosis not present

## 2016-10-18 DIAGNOSIS — E669 Obesity, unspecified: Secondary | ICD-10-CM | POA: Diagnosis not present

## 2016-10-19 DIAGNOSIS — E669 Obesity, unspecified: Secondary | ICD-10-CM | POA: Diagnosis not present

## 2016-10-20 DIAGNOSIS — E669 Obesity, unspecified: Secondary | ICD-10-CM | POA: Diagnosis not present

## 2016-10-21 DIAGNOSIS — J9610 Chronic respiratory failure, unspecified whether with hypoxia or hypercapnia: Secondary | ICD-10-CM | POA: Diagnosis not present

## 2016-10-21 DIAGNOSIS — J449 Chronic obstructive pulmonary disease, unspecified: Secondary | ICD-10-CM | POA: Diagnosis not present

## 2016-10-21 DIAGNOSIS — E669 Obesity, unspecified: Secondary | ICD-10-CM | POA: Diagnosis not present

## 2016-10-25 DIAGNOSIS — E669 Obesity, unspecified: Secondary | ICD-10-CM | POA: Diagnosis not present

## 2016-10-26 DIAGNOSIS — E669 Obesity, unspecified: Secondary | ICD-10-CM | POA: Diagnosis not present

## 2016-10-27 DIAGNOSIS — E669 Obesity, unspecified: Secondary | ICD-10-CM | POA: Diagnosis not present

## 2016-10-28 DIAGNOSIS — E669 Obesity, unspecified: Secondary | ICD-10-CM | POA: Diagnosis not present

## 2016-10-29 DIAGNOSIS — E669 Obesity, unspecified: Secondary | ICD-10-CM | POA: Diagnosis not present

## 2016-10-30 DIAGNOSIS — E669 Obesity, unspecified: Secondary | ICD-10-CM | POA: Diagnosis not present

## 2016-10-31 DIAGNOSIS — E669 Obesity, unspecified: Secondary | ICD-10-CM | POA: Diagnosis not present

## 2016-11-01 DIAGNOSIS — E669 Obesity, unspecified: Secondary | ICD-10-CM | POA: Diagnosis not present

## 2016-11-02 DIAGNOSIS — E669 Obesity, unspecified: Secondary | ICD-10-CM | POA: Diagnosis not present

## 2016-11-03 DIAGNOSIS — E669 Obesity, unspecified: Secondary | ICD-10-CM | POA: Diagnosis not present

## 2016-11-04 DIAGNOSIS — E669 Obesity, unspecified: Secondary | ICD-10-CM | POA: Diagnosis not present

## 2016-11-05 DIAGNOSIS — E669 Obesity, unspecified: Secondary | ICD-10-CM | POA: Diagnosis not present

## 2016-11-06 DIAGNOSIS — E669 Obesity, unspecified: Secondary | ICD-10-CM | POA: Diagnosis not present

## 2016-11-07 DIAGNOSIS — E669 Obesity, unspecified: Secondary | ICD-10-CM | POA: Diagnosis not present

## 2016-11-08 DIAGNOSIS — J9610 Chronic respiratory failure, unspecified whether with hypoxia or hypercapnia: Secondary | ICD-10-CM | POA: Diagnosis not present

## 2016-11-08 DIAGNOSIS — M6281 Muscle weakness (generalized): Secondary | ICD-10-CM | POA: Diagnosis not present

## 2016-11-08 DIAGNOSIS — G8929 Other chronic pain: Secondary | ICD-10-CM | POA: Diagnosis not present

## 2016-11-08 DIAGNOSIS — E669 Obesity, unspecified: Secondary | ICD-10-CM | POA: Diagnosis not present

## 2016-11-08 DIAGNOSIS — M5416 Radiculopathy, lumbar region: Secondary | ICD-10-CM | POA: Diagnosis not present

## 2016-11-09 DIAGNOSIS — E669 Obesity, unspecified: Secondary | ICD-10-CM | POA: Diagnosis not present

## 2016-11-10 DIAGNOSIS — E669 Obesity, unspecified: Secondary | ICD-10-CM | POA: Diagnosis not present

## 2016-11-11 DIAGNOSIS — E669 Obesity, unspecified: Secondary | ICD-10-CM | POA: Diagnosis not present

## 2016-11-12 DIAGNOSIS — E669 Obesity, unspecified: Secondary | ICD-10-CM | POA: Diagnosis not present

## 2016-11-13 DIAGNOSIS — E669 Obesity, unspecified: Secondary | ICD-10-CM | POA: Diagnosis not present

## 2016-11-14 DIAGNOSIS — E669 Obesity, unspecified: Secondary | ICD-10-CM | POA: Diagnosis not present

## 2016-11-15 DIAGNOSIS — E669 Obesity, unspecified: Secondary | ICD-10-CM | POA: Diagnosis not present

## 2016-11-16 DIAGNOSIS — E669 Obesity, unspecified: Secondary | ICD-10-CM | POA: Diagnosis not present

## 2016-11-17 DIAGNOSIS — E669 Obesity, unspecified: Secondary | ICD-10-CM | POA: Diagnosis not present

## 2016-11-18 DIAGNOSIS — E669 Obesity, unspecified: Secondary | ICD-10-CM | POA: Diagnosis not present

## 2016-11-19 DIAGNOSIS — E669 Obesity, unspecified: Secondary | ICD-10-CM | POA: Diagnosis not present

## 2016-11-20 DIAGNOSIS — E669 Obesity, unspecified: Secondary | ICD-10-CM | POA: Diagnosis not present

## 2016-11-20 DIAGNOSIS — J449 Chronic obstructive pulmonary disease, unspecified: Secondary | ICD-10-CM | POA: Diagnosis not present

## 2016-11-20 DIAGNOSIS — J9610 Chronic respiratory failure, unspecified whether with hypoxia or hypercapnia: Secondary | ICD-10-CM | POA: Diagnosis not present

## 2016-11-21 DIAGNOSIS — E669 Obesity, unspecified: Secondary | ICD-10-CM | POA: Diagnosis not present

## 2016-11-22 DIAGNOSIS — E669 Obesity, unspecified: Secondary | ICD-10-CM | POA: Diagnosis not present

## 2016-11-23 ENCOUNTER — Other Ambulatory Visit: Payer: Self-pay | Admitting: Internal Medicine

## 2016-11-23 DIAGNOSIS — E669 Obesity, unspecified: Secondary | ICD-10-CM | POA: Diagnosis not present

## 2016-11-24 DIAGNOSIS — E669 Obesity, unspecified: Secondary | ICD-10-CM | POA: Diagnosis not present

## 2016-11-25 DIAGNOSIS — E669 Obesity, unspecified: Secondary | ICD-10-CM | POA: Diagnosis not present

## 2016-11-26 DIAGNOSIS — E669 Obesity, unspecified: Secondary | ICD-10-CM | POA: Diagnosis not present

## 2016-11-27 ENCOUNTER — Other Ambulatory Visit: Payer: Self-pay | Admitting: Internal Medicine

## 2016-11-27 DIAGNOSIS — E669 Obesity, unspecified: Secondary | ICD-10-CM | POA: Diagnosis not present

## 2016-11-28 DIAGNOSIS — E669 Obesity, unspecified: Secondary | ICD-10-CM | POA: Diagnosis not present

## 2016-11-29 DIAGNOSIS — E669 Obesity, unspecified: Secondary | ICD-10-CM | POA: Diagnosis not present

## 2016-11-30 ENCOUNTER — Other Ambulatory Visit: Payer: Self-pay | Admitting: Internal Medicine

## 2016-11-30 DIAGNOSIS — E669 Obesity, unspecified: Secondary | ICD-10-CM | POA: Diagnosis not present

## 2016-12-01 DIAGNOSIS — E669 Obesity, unspecified: Secondary | ICD-10-CM | POA: Diagnosis not present

## 2016-12-02 DIAGNOSIS — E669 Obesity, unspecified: Secondary | ICD-10-CM | POA: Diagnosis not present

## 2016-12-03 DIAGNOSIS — E669 Obesity, unspecified: Secondary | ICD-10-CM | POA: Diagnosis not present

## 2016-12-04 DIAGNOSIS — E669 Obesity, unspecified: Secondary | ICD-10-CM | POA: Diagnosis not present

## 2016-12-05 DIAGNOSIS — E669 Obesity, unspecified: Secondary | ICD-10-CM | POA: Diagnosis not present

## 2016-12-05 DIAGNOSIS — R739 Hyperglycemia, unspecified: Secondary | ICD-10-CM | POA: Insufficient documentation

## 2016-12-05 NOTE — Progress Notes (Signed)
Subjective:    Patient ID: Shawna Fuentes, female    DOB: January 11, 1933, 83 y.o.   MRN: [id number]  HPI The patient is here for follow up.    Hypertension: She is taking her medication daily. She is compliant with a low sodium diet.  She denies chest pain, palpitations, shortness of breath and regular headaches. She is not exercising regularly.  She does monitor her blood pressure at home and it is better.  Her son is not convinced those measures are accurate.  He would like to have his daughter who is a Equities trader check it.     Hyperlipidemia: She is taking her medication daily. She is compliant with a low fat/cholesterol diet. She is not exercising regularly. She denies myalgias.   Hypothyroidism:  She is taking her medication daily.  She denies any recent changes in energy or weight that are unexplained.   CKD:  She does not see a kidney specialist.  She is taking her medications as prescribed.    Osteoporosis:  She has had prolia last fall.  She is taking calcium and vitamin D.  She is not exercising regularly, but tries to walk in her house in the hallway.   GERD:  She is taking her medication daily as prescribed.  She denies any GERD symptoms and feels her GERD is well controlled.   Leg edema:  She is still having fluid in her legs.  It is not hurt her.   She is compliant with a low sodium diet.  She elevates her legs when sitting.  She is taking her lasix 20 mg daily.   Medications and allergies reviewed with patient and updated if appropriate.  Patient Active Problem List   Diagnosis Date Noted  . Hyperglycemia 12/05/2016  . Cough 07/10/2016  . CKD (chronic kidney disease) stage 3, GFR 30-59 ml/min 06/08/2016  . Urinary incontinence 05/13/2016  . Cramp of both lower extremities 12/05/2015  . Obstructive sleep apnea 09/15/2014  . Osteoporosis 07/15/2014  . Arthritis pain of hand 07/10/2014  . Anemia, iron deficiency 07/10/2014  . Deep vein thrombosis ([place]) 12/24/2013    . Chronic respiratory failure with hypercapnia ([place]) 12/21/2013  . Post-surgical hypothyroidism 12/21/2013  . Hypoxemia 12/20/2013  . Edema 12/12/2013  . Fracture, subtrochanteric, right femur, closed ([place]) 09/22/2013  . Chronic radicular low back pain 12/30/2012  . Osteoarthritis of right knee 12/30/2012  . Hyperlipidemia   . Hypertension     Current Outpatient Prescriptions on File Prior to Visit  Medication Sig Dispense Refill  . amLODipine (NORVASC) 2.5 MG tablet Take 1 tablet (2.5 mg total) by mouth daily. 90 tablet 3  . aspirin 81 MG tablet Take 1 tablet (81 mg total) by mouth daily. 30 tablet   . atorvastatin (LIPITOR) 10 MG tablet TAKE 1 TABLET BY MOUTH EVERY EVENING 90 tablet 3  . calcitRIOL (ROCALTROL) 0.25 MCG capsule TAKE 1 CAPSULE (0.25 MCG TOTAL) BY MOUTH 2 (TWO) TIMES DAILY. 180 capsule 1  . calcium carbonate (OS-CAL) 600 MG TABS tablet Take 1 tablet (600 mg total) by mouth 2 (two) times daily with a meal. 180 tablet 3  . famotidine (PEPCID) 20 MG tablet TAKE 1 TABLET BY MOUTH EVERY DAY AT BEDTIME 90 tablet 0  . furosemide (LASIX) 20 MG tablet Take 2 tablets (40 mg total) by mouth daily. 10 tablet 0  . levothyroxine (SYNTHROID, LEVOTHROID) 125 MCG tablet TAKE 1 TABLET BY MOUTH EVERY DAY 90 tablet 0  . metoprolol tartrate (LOPRESSOR)  25 MG tablet Take 0.5 tablets (12.5 mg total) by mouth 2 (two) times daily. 90 tablet 3  . nystatin (MYCOSTATIN/NYSTOP) 100000 UNIT/GM POWD Apply to affected area 3 times a day 60 g 0  . sulindac (CLINORIL) 200 MG tablet TAKE 1 TABLET BY MOUTH TWICE A DAY 180 tablet 0  . traMADol (ULTRAM) 50 MG tablet Take 1 tablet (50 mg total) by mouth every 8 (eight) hours as needed. 30 tablet 0  . valsartan (DIOVAN) 160 MG tablet TAKE 1 TABLET BY MOUTH EVERY DAY 90 tablet 2  . Vitamin D, Ergocalciferol, (DRISDOL) 50000 UNITS CAPS capsule Take 50,000 Units by mouth.     No current facility-administered medications on file prior to visit.     Past Medical  History:  Diagnosis Date  . Arthritis   . DVT (deep venous thrombosis) ([place]) 12/2013   bilateral upper extremity DVT found at time of thyroidectomy   . History of kidney stones   . Hyperlipidemia   . Hypertension   . Hypothyroidism (acquired) 12/2013   s/p [facility] 12/21/[facility]  . Osteoporosis 07/15/2014   DEXA @ [facility] 07/14/14: -3.1  . Thyroid goiter    s/p thyroidectomy - pathology demonstrated nodular hyperplasia    Past Surgical History:  Procedure Laterality Date  . FEMUR IM NAIL Right 09/22/2013   Procedure: INTRAMEDULLARY (IM) NAIL FEMORAL;  Surgeon: Wylene Simmer, MD;  Location: [place];  Service: Orthopedics;  Laterality: Right;  . HIP SURGERY    . THYROIDECTOMY WITH STERNOTOMY  12/21/13   thyroid mass found incidentally on CT scan 12/2013; transferred to [facility] for surgical removal of thyroid and goiter requiring sternotomy    Social History   Social History  . Marital status: Widowed    Spouse name: N/A  . Number of children: N/A  . Years of education: N/A   Occupational History  . retired    Social History Main Topics  . Smoking status: Never Smoker  . Smokeless tobacco: Never Used     Comment: lives w/ dtr dorthea, 2nd dtr and 3 sons in town  . Alcohol use No  . Drug use: No  . Sexual activity: Not Asked   Other Topics Concern  . 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   Lives with daughter dorthea - has 3 sons and another daughter in town, supportive    Family History  Problem Relation Age of Onset  . Osteoarthritis Mother   . Hypertension Mother   . Hypertension Father   . Cancer Father        unknown type  . Diabetes Son   . Diabetes Son   . Clotting disorder Unknown   . Rheumatologic disease Unknown     Review of Systems  Constitutional: Negative for chills and fever.  HENT: Positive for postnasal drip.   Respiratory: Positive for cough.  Negative for shortness of breath and wheezing.   Cardiovascular: Positive for leg swelling. Negative for chest pain and palpitations.  Gastrointestinal: Negative for abdominal pain.       No gerd  Neurological: Negative for light-headedness and headaches.       Objective:   Vitals:   12/06/16 1040  BP: (!) 174/96  Pulse: 78  Resp: 18  Temp: 98.7 F (37.1 C)   Wt Readings from Last 3 Encounters:  12/06/16 256 lb (116.1 kg)  12/06/16 255 lb 9.6 oz (115.9  kg)  10/15/16 261 lb (118.4 kg)   Body mass index is 45.35 kg/m.   Physical Exam    Constitutional: Appears well-developed and well-nourished. No distress.  HENT:  Head: Normocephalic and atraumatic.  Neck: Neck supple. No tracheal deviation present. No thyromegaly present.  No cervical lymphadenopathy Cardiovascular: Normal rate, regular rhythm and normal heart sounds.   1/6 systolic murmur heard. No carotid bruit .  1+ pitting b/l LE edema Pulmonary/Chest: Effort normal and breath sounds normal. No respiratory distress. No has no wheezes. No rales.  Skin: Skin is warm and dry. Not diaphoretic.  Psychiatric: Normal mood and affect. Behavior is normal.      Assessment & Plan:    See Problem List for Assessment and Plan of chronic medical problems.

## 2016-12-05 NOTE — Patient Instructions (Addendum)
  Test(s) ordered today. Your results will be released to [facility] (or called to you) after review, usually within 72hours after test completion. If any changes need to be made, you will be notified at that same time.  All other Health Maintenance issues reviewed.   All recommended immunizations and age-appropriate screenings are up-to-date or discussed.  No immunizations administered today.   Medications reviewed and updated.   No changes recommended at this time.   A referral was ordered for kidney specialist - they will call you to schedule.  Please followup in 6 months

## 2016-12-05 NOTE — Assessment & Plan Note (Signed)
Will check a1c

## 2016-12-06 ENCOUNTER — Ambulatory Visit (INDEPENDENT_AMBULATORY_CARE_PROVIDER_SITE_OTHER): Payer: Medicare HMO | Admitting: Internal Medicine

## 2016-12-06 ENCOUNTER — Encounter: Payer: Self-pay | Admitting: Internal Medicine

## 2016-12-06 ENCOUNTER — Other Ambulatory Visit (INDEPENDENT_AMBULATORY_CARE_PROVIDER_SITE_OTHER): Payer: Medicare HMO

## 2016-12-06 ENCOUNTER — Ambulatory Visit: Payer: Medicare HMO | Admitting: Internal Medicine

## 2016-12-06 VITALS — BP 174/96 | HR 78 | Temp 98.7°F | Resp 18 | Wt 256.0 lb

## 2016-12-06 DIAGNOSIS — N183 Chronic kidney disease, stage 3 unspecified: Secondary | ICD-10-CM

## 2016-12-06 DIAGNOSIS — E890 Postprocedural hypothyroidism: Secondary | ICD-10-CM

## 2016-12-06 DIAGNOSIS — G4733 Obstructive sleep apnea (adult) (pediatric): Secondary | ICD-10-CM | POA: Diagnosis not present

## 2016-12-06 DIAGNOSIS — R739 Hyperglycemia, unspecified: Secondary | ICD-10-CM | POA: Diagnosis not present

## 2016-12-06 DIAGNOSIS — E7800 Pure hypercholesterolemia, unspecified: Secondary | ICD-10-CM | POA: Diagnosis not present

## 2016-12-06 DIAGNOSIS — D509 Iron deficiency anemia, unspecified: Secondary | ICD-10-CM

## 2016-12-06 DIAGNOSIS — I10 Essential (primary) hypertension: Secondary | ICD-10-CM | POA: Diagnosis not present

## 2016-12-06 DIAGNOSIS — R600 Localized edema: Secondary | ICD-10-CM

## 2016-12-06 DIAGNOSIS — J9612 Chronic respiratory failure with hypercapnia: Secondary | ICD-10-CM

## 2016-12-06 DIAGNOSIS — M810 Age-related osteoporosis without current pathological fracture: Secondary | ICD-10-CM

## 2016-12-06 DIAGNOSIS — E669 Obesity, unspecified: Secondary | ICD-10-CM | POA: Diagnosis not present

## 2016-12-06 LAB — COMPREHENSIVE METABOLIC PANEL
ALBUMIN: 4.1 g/dL (ref 3.5–5.2)
ALK PHOS: 100 U/L (ref 39–117)
ALT: 9 U/L (ref 0–35)
AST: 14 U/L (ref 0–37)
BILIRUBIN TOTAL: 0.9 mg/dL (ref 0.2–1.2)
BUN: 25 mg/dL — AB (ref 6–23)
CO2: 31 mEq/L (ref 19–32)
Calcium: 9.2 mg/dL (ref 8.4–10.5)
Chloride: 104 mEq/L (ref 96–112)
Creatinine, Ser: 1.12 mg/dL (ref 0.40–1.20)
GFR: 59.9 mL/min — ABNORMAL LOW (ref >60.00)
GLUCOSE: 99 mg/dL (ref 70–99)
Potassium: 4.4 mEq/L (ref 3.5–5.1)
Sodium: 142 mEq/L (ref 135–145)
TOTAL PROTEIN: 7.7 g/dL (ref 6.0–8.3)

## 2016-12-06 LAB — LIPID PANEL
CHOL/HDL RATIO: 3
Cholesterol: 167 mg/dL (ref 0–200)
HDL: 64.8 mg/dL (ref >39.00)
LDL Cholesterol: 87 mg/dL (ref 0–99)
NONHDL: 102.48
Triglycerides: 76 mg/dL (ref 0.0–149.0)
VLDL: 15.2 mg/dL (ref 0.0–40.0)

## 2016-12-06 LAB — CBC WITH DIFFERENTIAL/PLATELET
BASOS ABS: 0.1 10*3/uL (ref 0.0–0.1)
Basophils Relative: 1 % (ref 0.0–3.0)
EOS PCT: 4.3 % (ref 0.0–5.0)
Eosinophils Absolute: 0.3 10*3/uL (ref 0.0–0.7)
HCT: 37.7 % (ref 36.0–46.0)
HEMOGLOBIN: 12.3 g/dL (ref 12.0–15.0)
LYMPHS ABS: 1.9 10*3/uL (ref 0.7–4.0)
LYMPHS PCT: 26.1 % (ref 12.0–46.0)
MCHC: 32.6 g/dL (ref 30.0–36.0)
MCV: 89.6 fl (ref 78.0–100.0)
MONOS PCT: 5.3 % (ref 3.0–12.0)
Monocytes Absolute: 0.4 10*3/uL (ref 0.1–1.0)
NEUTROS PCT: 63.3 % (ref 43.0–77.0)
Neutro Abs: 4.6 10*3/uL (ref 1.4–7.7)
Platelets: 184 10*3/uL (ref 150.0–400.0)
RBC: 4.2 Mil/uL (ref 3.87–5.11)
RDW: 14.5 % (ref 11.5–15.5)
WBC: 7.3 10*3/uL (ref 4.0–10.5)

## 2016-12-06 LAB — IRON: IRON: 44 ug/dL (ref 42–145)

## 2016-12-06 LAB — FERRITIN: FERRITIN: 142.5 ng/mL (ref 10.0–291.0)

## 2016-12-06 LAB — VITAMIN D 25 HYDROXY (VIT D DEFICIENCY, FRACTURES): VITD: 39.52 ng/mL (ref 30.00–100.00)

## 2016-12-06 LAB — HEMOGLOBIN A1C: HEMOGLOBIN A1C: 5.5 % (ref 4.6–6.5)

## 2016-12-06 LAB — TSH: TSH: 0.03 u[IU]/mL — ABNORMAL LOW (ref 0.35–4.50)

## 2016-12-06 MED ORDER — FUROSEMIDE 20 MG PO TABS: 20.0000 mg | ORAL_TABLET | Freq: Every day | ORAL | 0 refills | Status: DC

## 2016-12-06 NOTE — Patient Instructions (Signed)
We can continue respiratory support during sleep with your full-face mask and Trilogy NIV machine.  Please call if we can help

## 2016-12-06 NOTE — Assessment & Plan Note (Signed)
Using [place] face mask with Trilogy NIV machine/ [facility]

## 2016-12-06 NOTE — Assessment & Plan Note (Signed)
cmp today Continue calcitriol Refer to nephrology - has not established yet

## 2016-12-06 NOTE — Assessment & Plan Note (Signed)
Check tsh  Titrate med dose if needed  

## 2016-12-06 NOTE — Progress Notes (Signed)
HPI female never smoker followed for OSA, complicated by DVT, obesity, had hypercapnic respiratory failure at time of goiter surgery , chronic R diaphragm elevation                 Son here ABG on 2 L oxygen 07/09/2014-pH 7.36, PCO2 52.8, PO2 101, HCO3 29.6 Walk test on room air-08/07/15-held saturation 97% on room air, resting saturation on room air 97%. Walked slowly with a walker 185 feet. ABG on room air 08/07/2015-pH 7.40, PCO2 49, PO2 67.5, HCO3 29.9, saturation 92.1%.  -----------------------------------------------------------------------------------------------------  08/07/2015-83 year old female never smoker followed for OSA, Dated by DVT, obesity, had hypercapnic respiratory failure at time of goiter surgery O2 2L/ [employer]- continuous  POC Trilogy/ [employer] NIV mode AVAP-AE  AVAPS rate 3, Vt 330, PS max 30, min 8, EPAP max 15/ min 4, ramp off,  Download- 83% of days, avg 3 hr 10 min FOLLOWS FOR: DME is [employer]; Wears O2 all the time however not wearing CPAP-hard time with throat tickles and machine. Would like to know about coming off the of the CPAP.  Compliance download shows that she keeps trying her Trilogy device but tends to take it off after a couple of hours each night. Wearing it she develops a throat tickle cough which she does not experience breathing room air. Walk test on room air-08/07/15-held saturation 97% on room air, resting saturation on room air 97%. Walked slowly with a walker 185 feet. ABG on room air 08/07/2015-pH 7.40, PCO2 49, PO2 67.5, HCO3 29.9, saturation 92.1%.  12/05/2015-83 year old female never smoker followed for OSA, complicated by DVT, obesity, had hypercapnic respiratory failure at time of goiter surgery                  Son here O2 2 L/[employer]-continuous POCTrilogy/ [employer] NIV mode AVAP-AE  AVAPS rate 3, Vt 330, PS max 30, min 8, EPAP max 15/ min 4, ramp off, Pt states that she feels good and her cough has improved. Pt denies cough/wheeze/SOB/CP/tightness. Pt has  not needed supplemental O2 for about 2 days.  She has not been using portable oxygen much at all and paces herself as needed during the day. We discussed quitting portable oxygen to save money.  12/06/16- 83 year old female never smoker followed for OSA, complicated by DVT, obesity, had hypercapnic respiratory failure at time of goiter surgery                  Son here Trilogy/ [employer] NIV mode AVAP-AE AVAPS rate 3, Vt 330, PS max 30, min 8, EPAP max 15/ min 4, ramp off. FOLLOWS FOR: DME: [employer]; pt wears CPAP nightly and no new supplies needed at this time. No DL and not in AV.  She says she can't sleep without her Trilogy NIV machine. Uses fullface mask. Occasionally slow back to sleep after up to bathroom.  If she dozes off in the daytime she tends to wake confused. No longer has oxygen-we discontinued last year  ROS-see HPI Constitutional:   No-   weight loss, night sweats, fevers, chills, fatigue, lassitude. HEENT:   No-  headaches, difficulty swallowing, tooth/dental problems, sore throat,       No-  sneezing, itching, ear ache, nasal congestion, post nasal drip,  CV:  No-   chest pain, orthopnea, PND, swelling in lower extremities, anasarca,  dizziness, palpitations Resp: +shortness of breath with exertion or at rest.              productive cough,  No non-productive cough,  No- coughing up of blood.              No-   change in color of mucus.  No- wheezing.   Skin: No-   rash or lesions. GI:  + heartburn, No- indigestion, abdominal pain, nausea, vomiting,  GU: . MS:  No-   joint pain or swelling.   Neuro-     nothing unusual Psych:  No- change in mood or affect. No depression or anxiety.  No memory loss.  OBJ- Physical Exam  + obesity, + rolling walker,  General- Alert, Oriented, Affect-appropriate, Distress- none acute    +room air saturation 98% Skin- rash-none, lesions- none, excoriation- none Lymphadenopathy- none Head-  atraumatic            Eyes- Gross vision intact, PERRLA, conjunctivae and secretions clear            Ears- Hearing, canals-normal            Nose- Clear, no-Septal dev, mucus, polyps, erosion, perforation             Throat- Mallampati III-IV , mucosa clear , drainage- none, tonsils- atrophic, + dentures Neck- flexible , trachea midline, no stridor , carotid no bruit, thyroidectomy/tracheostomy scar  Chest - symmetrical excursion , unlabored           Heart/CV- RRR , no murmur , no gallop  , no rub, nl s1 s2                           - JVD- none , edema- none, stasis changes- none, varices- none           Lung- clear, unlabored at rest, wheeze- none, cough- none , dullness-none, rub- none           Chest wall-  Abd-  Br/ Gen/ Rectal- Not done, not indicated Extrem- cyanosis- none, clubbing, none, atrophy- none, strength- nl, + heavy without definite edema,  Neuro- grossly intact to observation

## 2016-12-06 NOTE — Assessment & Plan Note (Addendum)
BP Readings from Last 3 Encounters:  12/06/16 (!) 174/96  12/06/16 122/80  10/15/16 (!) 160/70   ? Controlled - advised her to bring her cuff in Will have her granddaughter check her BP - she is an Therapist, sports Stressed the importance of BP control for her CKD

## 2016-12-06 NOTE — Assessment & Plan Note (Signed)
Continue prolia Continue calcium, vitamin d Increase walking

## 2016-12-06 NOTE — Assessment & Plan Note (Signed)
Check lipid panel  Continue daily statin Regular exercise and healthy diet encouraged  

## 2016-12-06 NOTE — Assessment & Plan Note (Signed)
CO2 retention during sleep, apple OSA are managed by using a Trilogy and IV. She is comfortable with this and says she sleeps much better with it than without. Oxygenation now is good at this office visit.

## 2016-12-06 NOTE — Assessment & Plan Note (Signed)
Taking lasix 20 mg daily - may need an increase cmp today Will refer to nephrology

## 2016-12-06 NOTE — Assessment & Plan Note (Signed)
Cbc, iron, ferritin 

## 2016-12-07 ENCOUNTER — Other Ambulatory Visit: Payer: Self-pay | Admitting: Emergency Medicine

## 2016-12-07 DIAGNOSIS — E669 Obesity, unspecified: Secondary | ICD-10-CM | POA: Diagnosis not present

## 2016-12-07 MED ORDER — LEVOTHYROXINE SODIUM 112 MCG PO TABS: 112.0000 ug | ORAL_TABLET | Freq: Every day | ORAL | 1 refills | Status: DC

## 2016-12-08 DIAGNOSIS — E669 Obesity, unspecified: Secondary | ICD-10-CM | POA: Diagnosis not present

## 2016-12-09 DIAGNOSIS — J9610 Chronic respiratory failure, unspecified whether with hypoxia or hypercapnia: Secondary | ICD-10-CM | POA: Diagnosis not present

## 2016-12-09 DIAGNOSIS — E669 Obesity, unspecified: Secondary | ICD-10-CM | POA: Diagnosis not present

## 2016-12-09 DIAGNOSIS — M6281 Muscle weakness (generalized): Secondary | ICD-10-CM | POA: Diagnosis not present

## 2016-12-09 DIAGNOSIS — M5416 Radiculopathy, lumbar region: Secondary | ICD-10-CM | POA: Diagnosis not present

## 2016-12-09 DIAGNOSIS — G8929 Other chronic pain: Secondary | ICD-10-CM | POA: Diagnosis not present

## 2016-12-10 DIAGNOSIS — E669 Obesity, unspecified: Secondary | ICD-10-CM | POA: Diagnosis not present

## 2016-12-11 DIAGNOSIS — E669 Obesity, unspecified: Secondary | ICD-10-CM | POA: Diagnosis not present

## 2016-12-12 DIAGNOSIS — E669 Obesity, unspecified: Secondary | ICD-10-CM | POA: Diagnosis not present

## 2016-12-13 DIAGNOSIS — E669 Obesity, unspecified: Secondary | ICD-10-CM | POA: Diagnosis not present

## 2016-12-14 ENCOUNTER — Telehealth: Payer: Self-pay | Admitting: Internal Medicine

## 2016-12-14 DIAGNOSIS — E669 Obesity, unspecified: Secondary | ICD-10-CM | POA: Diagnosis not present

## 2016-12-14 NOTE — Telephone Encounter (Signed)
Please call patient - please have her stop the sulindac.  She should not take any advil, aleve or ibuprofen to help protect her kidneys.  The kidney doctor will review her kidney function further with her.    [sulindac removed from med list]

## 2016-12-15 DIAGNOSIS — E669 Obesity, unspecified: Secondary | ICD-10-CM | POA: Diagnosis not present

## 2016-12-15 NOTE — Telephone Encounter (Signed)
Spoke with pts son to inform.

## 2016-12-16 ENCOUNTER — Encounter: Payer: Self-pay | Admitting: Internal Medicine

## 2016-12-16 DIAGNOSIS — E669 Obesity, unspecified: Secondary | ICD-10-CM | POA: Diagnosis not present

## 2016-12-17 DIAGNOSIS — E669 Obesity, unspecified: Secondary | ICD-10-CM | POA: Diagnosis not present

## 2016-12-18 DIAGNOSIS — E669 Obesity, unspecified: Secondary | ICD-10-CM | POA: Diagnosis not present

## 2016-12-19 ENCOUNTER — Other Ambulatory Visit: Payer: Self-pay | Admitting: Internal Medicine

## 2016-12-19 DIAGNOSIS — E669 Obesity, unspecified: Secondary | ICD-10-CM | POA: Diagnosis not present

## 2016-12-20 DIAGNOSIS — E669 Obesity, unspecified: Secondary | ICD-10-CM | POA: Diagnosis not present

## 2016-12-21 DIAGNOSIS — J449 Chronic obstructive pulmonary disease, unspecified: Secondary | ICD-10-CM | POA: Diagnosis not present

## 2016-12-21 DIAGNOSIS — J9610 Chronic respiratory failure, unspecified whether with hypoxia or hypercapnia: Secondary | ICD-10-CM | POA: Diagnosis not present

## 2016-12-21 DIAGNOSIS — E669 Obesity, unspecified: Secondary | ICD-10-CM | POA: Diagnosis not present

## 2016-12-22 DIAGNOSIS — E669 Obesity, unspecified: Secondary | ICD-10-CM | POA: Diagnosis not present

## 2016-12-23 DIAGNOSIS — E669 Obesity, unspecified: Secondary | ICD-10-CM | POA: Diagnosis not present

## 2016-12-24 DIAGNOSIS — E669 Obesity, unspecified: Secondary | ICD-10-CM | POA: Diagnosis not present

## 2016-12-25 DIAGNOSIS — E669 Obesity, unspecified: Secondary | ICD-10-CM | POA: Diagnosis not present

## 2016-12-26 DIAGNOSIS — E669 Obesity, unspecified: Secondary | ICD-10-CM | POA: Diagnosis not present

## 2016-12-27 DIAGNOSIS — E669 Obesity, unspecified: Secondary | ICD-10-CM | POA: Diagnosis not present

## 2016-12-28 DIAGNOSIS — E669 Obesity, unspecified: Secondary | ICD-10-CM | POA: Diagnosis not present

## 2016-12-29 DIAGNOSIS — E669 Obesity, unspecified: Secondary | ICD-10-CM | POA: Diagnosis not present

## 2016-12-30 DIAGNOSIS — E669 Obesity, unspecified: Secondary | ICD-10-CM | POA: Diagnosis not present

## 2016-12-31 DIAGNOSIS — E669 Obesity, unspecified: Secondary | ICD-10-CM | POA: Diagnosis not present

## 2017-01-01 DIAGNOSIS — E669 Obesity, unspecified: Secondary | ICD-10-CM | POA: Diagnosis not present

## 2017-01-02 DIAGNOSIS — E669 Obesity, unspecified: Secondary | ICD-10-CM | POA: Diagnosis not present

## 2017-01-03 DIAGNOSIS — E669 Obesity, unspecified: Secondary | ICD-10-CM | POA: Diagnosis not present

## 2017-01-04 DIAGNOSIS — E669 Obesity, unspecified: Secondary | ICD-10-CM | POA: Diagnosis not present

## 2017-01-06 ENCOUNTER — Ambulatory Visit: Payer: Commercial Managed Care - HMO | Admitting: Internal Medicine

## 2017-01-06 DIAGNOSIS — E669 Obesity, unspecified: Secondary | ICD-10-CM | POA: Diagnosis not present

## 2017-01-07 DIAGNOSIS — E669 Obesity, unspecified: Secondary | ICD-10-CM | POA: Diagnosis not present

## 2017-01-08 DIAGNOSIS — M5416 Radiculopathy, lumbar region: Secondary | ICD-10-CM | POA: Diagnosis not present

## 2017-01-08 DIAGNOSIS — G8929 Other chronic pain: Secondary | ICD-10-CM | POA: Diagnosis not present

## 2017-01-08 DIAGNOSIS — M6281 Muscle weakness (generalized): Secondary | ICD-10-CM | POA: Diagnosis not present

## 2017-01-08 DIAGNOSIS — J9610 Chronic respiratory failure, unspecified whether with hypoxia or hypercapnia: Secondary | ICD-10-CM | POA: Diagnosis not present

## 2017-01-08 DIAGNOSIS — E669 Obesity, unspecified: Secondary | ICD-10-CM | POA: Diagnosis not present

## 2017-01-09 DIAGNOSIS — E669 Obesity, unspecified: Secondary | ICD-10-CM | POA: Diagnosis not present

## 2017-01-10 DIAGNOSIS — E669 Obesity, unspecified: Secondary | ICD-10-CM | POA: Diagnosis not present

## 2017-01-11 DIAGNOSIS — E669 Obesity, unspecified: Secondary | ICD-10-CM | POA: Diagnosis not present

## 2017-01-12 DIAGNOSIS — E669 Obesity, unspecified: Secondary | ICD-10-CM | POA: Diagnosis not present

## 2017-01-13 DIAGNOSIS — E669 Obesity, unspecified: Secondary | ICD-10-CM | POA: Diagnosis not present

## 2017-01-14 DIAGNOSIS — E669 Obesity, unspecified: Secondary | ICD-10-CM | POA: Diagnosis not present

## 2017-01-15 DIAGNOSIS — E669 Obesity, unspecified: Secondary | ICD-10-CM | POA: Diagnosis not present

## 2017-01-16 DIAGNOSIS — E669 Obesity, unspecified: Secondary | ICD-10-CM | POA: Diagnosis not present

## 2017-01-17 DIAGNOSIS — E669 Obesity, unspecified: Secondary | ICD-10-CM | POA: Diagnosis not present

## 2017-01-18 DIAGNOSIS — E669 Obesity, unspecified: Secondary | ICD-10-CM | POA: Diagnosis not present

## 2017-01-19 DIAGNOSIS — E669 Obesity, unspecified: Secondary | ICD-10-CM | POA: Diagnosis not present

## 2017-01-20 DIAGNOSIS — J449 Chronic obstructive pulmonary disease, unspecified: Secondary | ICD-10-CM | POA: Diagnosis not present

## 2017-01-20 DIAGNOSIS — E669 Obesity, unspecified: Secondary | ICD-10-CM | POA: Diagnosis not present

## 2017-01-20 DIAGNOSIS — J9610 Chronic respiratory failure, unspecified whether with hypoxia or hypercapnia: Secondary | ICD-10-CM | POA: Diagnosis not present

## 2017-01-21 DIAGNOSIS — E669 Obesity, unspecified: Secondary | ICD-10-CM | POA: Diagnosis not present

## 2017-01-22 DIAGNOSIS — E669 Obesity, unspecified: Secondary | ICD-10-CM | POA: Diagnosis not present

## 2017-01-23 DIAGNOSIS — E669 Obesity, unspecified: Secondary | ICD-10-CM | POA: Diagnosis not present

## 2017-01-24 DIAGNOSIS — E669 Obesity, unspecified: Secondary | ICD-10-CM | POA: Diagnosis not present

## 2017-01-25 DIAGNOSIS — Z6841 Body Mass Index (BMI) 40.0 and over, adult: Secondary | ICD-10-CM | POA: Diagnosis not present

## 2017-01-25 DIAGNOSIS — E669 Obesity, unspecified: Secondary | ICD-10-CM | POA: Diagnosis not present

## 2017-01-25 DIAGNOSIS — N179 Acute kidney failure, unspecified: Secondary | ICD-10-CM | POA: Diagnosis not present

## 2017-01-25 DIAGNOSIS — N189 Chronic kidney disease, unspecified: Secondary | ICD-10-CM | POA: Diagnosis not present

## 2017-01-26 DIAGNOSIS — E669 Obesity, unspecified: Secondary | ICD-10-CM | POA: Diagnosis not present

## 2017-01-27 ENCOUNTER — Other Ambulatory Visit: Payer: Self-pay | Admitting: Internal Medicine

## 2017-01-27 ENCOUNTER — Telehealth: Payer: Self-pay | Admitting: Internal Medicine

## 2017-01-27 DIAGNOSIS — E669 Obesity, unspecified: Secondary | ICD-10-CM | POA: Diagnosis not present

## 2017-01-28 DIAGNOSIS — E669 Obesity, unspecified: Secondary | ICD-10-CM | POA: Diagnosis not present

## 2017-01-29 DIAGNOSIS — E669 Obesity, unspecified: Secondary | ICD-10-CM | POA: Diagnosis not present

## 2017-01-30 DIAGNOSIS — E669 Obesity, unspecified: Secondary | ICD-10-CM | POA: Diagnosis not present

## 2017-01-31 DIAGNOSIS — E669 Obesity, unspecified: Secondary | ICD-10-CM | POA: Diagnosis not present

## 2017-02-01 ENCOUNTER — Telehealth: Payer: Self-pay

## 2017-02-01 ENCOUNTER — Other Ambulatory Visit: Payer: Self-pay

## 2017-02-01 DIAGNOSIS — R600 Localized edema: Secondary | ICD-10-CM

## 2017-02-01 DIAGNOSIS — M810 Age-related osteoporosis without current pathological fracture: Secondary | ICD-10-CM

## 2017-02-01 DIAGNOSIS — E669 Obesity, unspecified: Secondary | ICD-10-CM | POA: Diagnosis not present

## 2017-02-01 NOTE — Telephone Encounter (Signed)
Patient is requesting refill on 20mg  lasix tablet---I can tell you changed mg dosage at her last visit in June/18 from 40mg  to 20mg , but there were no refills sent in on new mg dosage----If you are ok with patient getting more refills, she is needing one sent to her pharmacy---please advise, I will send if you are ok with it, thanks

## 2017-02-01 NOTE — Telephone Encounter (Signed)
Ok to refill 20 mg daily with refills.

## 2017-02-01 NOTE — Telephone Encounter (Signed)
I have advised patient that she is due for her next prolia injection either on/after 02/15/17---patient's insurance will cover estimated entire cost of injection if patient will use her pharmacy benefit----this means patient will have to pick up prolia syringe at a [facility] and bring injection to [facility] office to be administered----an rx request needs to be sent to [facility] when patient calls me back to let me know what day (close to 8/14) her daughter or son can go by [facility] to pick up syringe and bring to our office---patient to check with her son/daughter and call me back to let me know when rx should be sent to [facility]---can talk with Sebert Stollings if any further questions

## 2017-02-02 ENCOUNTER — Other Ambulatory Visit: Payer: Self-pay

## 2017-02-02 DIAGNOSIS — E669 Obesity, unspecified: Secondary | ICD-10-CM | POA: Diagnosis not present

## 2017-02-02 DIAGNOSIS — R600 Localized edema: Secondary | ICD-10-CM

## 2017-02-02 MED ORDER — FUROSEMIDE 20 MG PO TABS: 20.0000 mg | ORAL_TABLET | Freq: Every day | ORAL | 0 refills | Status: DC

## 2017-02-03 DIAGNOSIS — E669 Obesity, unspecified: Secondary | ICD-10-CM | POA: Diagnosis not present

## 2017-02-04 DIAGNOSIS — E669 Obesity, unspecified: Secondary | ICD-10-CM | POA: Diagnosis not present

## 2017-02-05 DIAGNOSIS — E669 Obesity, unspecified: Secondary | ICD-10-CM | POA: Diagnosis not present

## 2017-02-06 DIAGNOSIS — E669 Obesity, unspecified: Secondary | ICD-10-CM | POA: Diagnosis not present

## 2017-02-07 DIAGNOSIS — E669 Obesity, unspecified: Secondary | ICD-10-CM | POA: Diagnosis not present

## 2017-02-08 DIAGNOSIS — M5416 Radiculopathy, lumbar region: Secondary | ICD-10-CM | POA: Diagnosis not present

## 2017-02-08 DIAGNOSIS — M6281 Muscle weakness (generalized): Secondary | ICD-10-CM | POA: Diagnosis not present

## 2017-02-08 DIAGNOSIS — J9610 Chronic respiratory failure, unspecified whether with hypoxia or hypercapnia: Secondary | ICD-10-CM | POA: Diagnosis not present

## 2017-02-08 DIAGNOSIS — E669 Obesity, unspecified: Secondary | ICD-10-CM | POA: Diagnosis not present

## 2017-02-08 DIAGNOSIS — G8929 Other chronic pain: Secondary | ICD-10-CM | POA: Diagnosis not present

## 2017-02-09 DIAGNOSIS — E669 Obesity, unspecified: Secondary | ICD-10-CM | POA: Diagnosis not present

## 2017-02-09 NOTE — Telephone Encounter (Signed)
Patient states she will need to wait a couple of months in order to have transportation to drug store and then over to our office---patient advised that insurance benefits we have talked about will be valid until dec 31,2018---when patient calls back to have me send rx---she [facility]--- [facility]---patient will pick up at [facility] and bring to [place] office for administration---can talk with Naydene Kamrowski if any further questions

## 2017-02-10 DIAGNOSIS — E669 Obesity, unspecified: Secondary | ICD-10-CM | POA: Diagnosis not present

## 2017-02-11 DIAGNOSIS — E669 Obesity, unspecified: Secondary | ICD-10-CM | POA: Diagnosis not present

## 2017-02-12 ENCOUNTER — Other Ambulatory Visit: Payer: Self-pay | Admitting: Internal Medicine

## 2017-02-12 DIAGNOSIS — E669 Obesity, unspecified: Secondary | ICD-10-CM | POA: Diagnosis not present

## 2017-02-13 DIAGNOSIS — E669 Obesity, unspecified: Secondary | ICD-10-CM | POA: Diagnosis not present

## 2017-02-14 DIAGNOSIS — E669 Obesity, unspecified: Secondary | ICD-10-CM | POA: Diagnosis not present

## 2017-02-15 DIAGNOSIS — E669 Obesity, unspecified: Secondary | ICD-10-CM | POA: Diagnosis not present

## 2017-02-15 NOTE — Telephone Encounter (Signed)
Patient is requesting call in regard.  States she has transportation to get injection on Friday.  [facility] [address]  Please follow up with patient in regard. Patients phone number is [phone].

## 2017-02-16 ENCOUNTER — Telehealth: Payer: Self-pay

## 2017-02-16 DIAGNOSIS — E669 Obesity, unspecified: Secondary | ICD-10-CM | POA: Diagnosis not present

## 2017-02-16 MED ORDER — DENOSUMAB 60 MG/ML ~~LOC~~ SOLN: 60.0000 mg | Freq: Once | SUBCUTANEOUS | 0 refills | Status: AC

## 2017-02-16 NOTE — Telephone Encounter (Signed)
Patient has been advised that prolia rx has been sent to [facility] specialty pharmacy---[facility] to try to [facility] rx to local pharm (that phone # is [phone])---I will call patient back if they think they cant get it to local pharm by Friday for pick up---

## 2017-02-16 NOTE — Telephone Encounter (Signed)
error 

## 2017-02-16 NOTE — Addendum Note (Signed)
Addended by: Ander Slade on: 02/16/2017 04:38 PM   Modules accepted: Orders

## 2017-02-16 NOTE — Telephone Encounter (Signed)
Pt called again- please call back

## 2017-02-17 DIAGNOSIS — E669 Obesity, unspecified: Secondary | ICD-10-CM | POA: Diagnosis not present

## 2017-02-17 NOTE — Telephone Encounter (Signed)
Patient called back stating that pharmacy did not get script.  Is requesting script to be sent again.  States pharmacy can be reached at [phone].

## 2017-02-17 NOTE — Telephone Encounter (Signed)
I [facility]---I bypassed the local pharm hoping to save time and get rx delivered [facility] sooner

## 2017-02-18 DIAGNOSIS — E669 Obesity, unspecified: Secondary | ICD-10-CM | POA: Diagnosis not present

## 2017-02-19 DIAGNOSIS — E669 Obesity, unspecified: Secondary | ICD-10-CM | POA: Diagnosis not present

## 2017-02-20 DIAGNOSIS — J449 Chronic obstructive pulmonary disease, unspecified: Secondary | ICD-10-CM | POA: Diagnosis not present

## 2017-02-20 DIAGNOSIS — J9610 Chronic respiratory failure, unspecified whether with hypoxia or hypercapnia: Secondary | ICD-10-CM | POA: Diagnosis not present

## 2017-02-20 DIAGNOSIS — E669 Obesity, unspecified: Secondary | ICD-10-CM | POA: Diagnosis not present

## 2017-02-21 ENCOUNTER — Telehealth: Payer: Self-pay | Admitting: Internal Medicine

## 2017-02-21 DIAGNOSIS — E669 Obesity, unspecified: Secondary | ICD-10-CM | POA: Diagnosis not present

## 2017-02-21 MED ORDER — LOSARTAN POTASSIUM 100 MG PO TABS: 100.0000 mg | ORAL_TABLET | Freq: Every day | ORAL | 3 refills | Status: DC

## 2017-02-21 NOTE — Telephone Encounter (Signed)
Notified pt w/MD response.../lmb 

## 2017-02-21 NOTE — Telephone Encounter (Signed)
Pt got a letter regarding her valsartan (DIOVAN) 160 MG tablet  She would like something else called [facility]  [facility] [address]

## 2017-02-21 NOTE — Telephone Encounter (Signed)
Losartan sent as a substitution

## 2017-02-22 DIAGNOSIS — E669 Obesity, unspecified: Secondary | ICD-10-CM | POA: Diagnosis not present

## 2017-02-23 ENCOUNTER — Telehealth: Payer: Self-pay

## 2017-02-23 DIAGNOSIS — M810 Age-related osteoporosis without current pathological fracture: Secondary | ICD-10-CM

## 2017-02-23 DIAGNOSIS — E669 Obesity, unspecified: Secondary | ICD-10-CM | POA: Diagnosis not present

## 2017-02-23 MED ORDER — DENOSUMAB 60 MG/ML ~~LOC~~ SOLN: 60.0000 mg | Freq: Once | SUBCUTANEOUS | 0 refills | Status: AC

## 2017-02-23 NOTE — Telephone Encounter (Signed)
rx has been sent to [facility]

## 2017-02-23 NOTE — Telephone Encounter (Signed)
Patient is having issues with pharmacy.  Is requesting call back at [phone].

## 2017-02-24 DIAGNOSIS — E669 Obesity, unspecified: Secondary | ICD-10-CM | POA: Diagnosis not present

## 2017-02-24 NOTE — Telephone Encounter (Signed)
error 

## 2017-02-25 DIAGNOSIS — E669 Obesity, unspecified: Secondary | ICD-10-CM | POA: Diagnosis not present

## 2017-02-26 DIAGNOSIS — E669 Obesity, unspecified: Secondary | ICD-10-CM | POA: Diagnosis not present

## 2017-02-27 ENCOUNTER — Other Ambulatory Visit: Payer: Self-pay | Admitting: Internal Medicine

## 2017-02-27 DIAGNOSIS — E669 Obesity, unspecified: Secondary | ICD-10-CM | POA: Diagnosis not present

## 2017-02-28 ENCOUNTER — Telehealth: Payer: Self-pay

## 2017-02-28 DIAGNOSIS — E669 Obesity, unspecified: Secondary | ICD-10-CM | POA: Diagnosis not present

## 2017-02-28 NOTE — Telephone Encounter (Signed)
[facility] that [facility] be [facility] and [facility]---I have completed and faxed back in order for patient to pick up prolia syringe and bring to our office for administration---copy of faxed paper is in file cabinet on side a ---patient advised

## 2017-03-01 DIAGNOSIS — E669 Obesity, unspecified: Secondary | ICD-10-CM | POA: Diagnosis not present

## 2017-03-02 DIAGNOSIS — E669 Obesity, unspecified: Secondary | ICD-10-CM | POA: Diagnosis not present

## 2017-03-03 DIAGNOSIS — E669 Obesity, unspecified: Secondary | ICD-10-CM | POA: Diagnosis not present

## 2017-03-03 NOTE — Telephone Encounter (Signed)
[facility] for this patient on Wednesday, sept 5th in the am only----this prolia should be labeled with patient's name and/or attn: tamara----patient advised that she can come either Thursday sept 6th or anytime after that to get injection---I am letting patient know now, because she has to arrange for her daughter or son to pick her up and bring her to office

## 2017-03-04 DIAGNOSIS — E669 Obesity, unspecified: Secondary | ICD-10-CM | POA: Diagnosis not present

## 2017-03-05 DIAGNOSIS — E669 Obesity, unspecified: Secondary | ICD-10-CM | POA: Diagnosis not present

## 2017-03-06 DIAGNOSIS — E669 Obesity, unspecified: Secondary | ICD-10-CM | POA: Diagnosis not present

## 2017-03-08 DIAGNOSIS — E669 Obesity, unspecified: Secondary | ICD-10-CM | POA: Diagnosis not present

## 2017-03-09 ENCOUNTER — Telehealth: Payer: Self-pay

## 2017-03-09 DIAGNOSIS — E669 Obesity, unspecified: Secondary | ICD-10-CM | POA: Diagnosis not present

## 2017-03-09 NOTE — Telephone Encounter (Signed)
Patient has been advised that her prolia injection has arrived in our office from specialty pharmacy--patient is trying to get a ride to come in on Monday 9/10---patient has been advised that I will hold this prolia for her until she is able to come in---can talk with tamara if any questions

## 2017-03-10 DIAGNOSIS — E669 Obesity, unspecified: Secondary | ICD-10-CM | POA: Diagnosis not present

## 2017-03-11 DIAGNOSIS — G8929 Other chronic pain: Secondary | ICD-10-CM | POA: Diagnosis not present

## 2017-03-11 DIAGNOSIS — E669 Obesity, unspecified: Secondary | ICD-10-CM | POA: Diagnosis not present

## 2017-03-11 DIAGNOSIS — M6281 Muscle weakness (generalized): Secondary | ICD-10-CM | POA: Diagnosis not present

## 2017-03-11 DIAGNOSIS — J9610 Chronic respiratory failure, unspecified whether with hypoxia or hypercapnia: Secondary | ICD-10-CM | POA: Diagnosis not present

## 2017-03-11 DIAGNOSIS — M5416 Radiculopathy, lumbar region: Secondary | ICD-10-CM | POA: Diagnosis not present

## 2017-03-12 DIAGNOSIS — E669 Obesity, unspecified: Secondary | ICD-10-CM | POA: Diagnosis not present

## 2017-03-13 DIAGNOSIS — E669 Obesity, unspecified: Secondary | ICD-10-CM | POA: Diagnosis not present

## 2017-03-14 ENCOUNTER — Ambulatory Visit (INDEPENDENT_AMBULATORY_CARE_PROVIDER_SITE_OTHER): Payer: Medicare HMO

## 2017-03-14 DIAGNOSIS — M810 Age-related osteoporosis without current pathological fracture: Secondary | ICD-10-CM

## 2017-03-14 DIAGNOSIS — E669 Obesity, unspecified: Secondary | ICD-10-CM | POA: Diagnosis not present

## 2017-03-14 MED ORDER — DENOSUMAB 60 MG/ML ~~LOC~~ SOLN
60.0000 mg | Freq: Once | SUBCUTANEOUS | Status: AC
  Administered 2017-03-14: 60 mg via SUBCUTANEOUS

## 2017-03-14 NOTE — Progress Notes (Signed)
prolia Injection given.   Tyra Michelle J Romaine Neville, MD  

## 2017-03-15 DIAGNOSIS — E669 Obesity, unspecified: Secondary | ICD-10-CM | POA: Diagnosis not present

## 2017-03-16 DIAGNOSIS — E669 Obesity, unspecified: Secondary | ICD-10-CM | POA: Diagnosis not present

## 2017-03-17 DIAGNOSIS — E669 Obesity, unspecified: Secondary | ICD-10-CM | POA: Diagnosis not present

## 2017-03-18 ENCOUNTER — Other Ambulatory Visit: Payer: Self-pay | Admitting: Internal Medicine

## 2017-03-18 DIAGNOSIS — E669 Obesity, unspecified: Secondary | ICD-10-CM | POA: Diagnosis not present

## 2017-03-19 DIAGNOSIS — E669 Obesity, unspecified: Secondary | ICD-10-CM | POA: Diagnosis not present

## 2017-03-20 DIAGNOSIS — E669 Obesity, unspecified: Secondary | ICD-10-CM | POA: Diagnosis not present

## 2017-03-21 DIAGNOSIS — E669 Obesity, unspecified: Secondary | ICD-10-CM | POA: Diagnosis not present

## 2017-03-22 DIAGNOSIS — E669 Obesity, unspecified: Secondary | ICD-10-CM | POA: Diagnosis not present

## 2017-03-23 DIAGNOSIS — E669 Obesity, unspecified: Secondary | ICD-10-CM | POA: Diagnosis not present

## 2017-03-23 DIAGNOSIS — J9610 Chronic respiratory failure, unspecified whether with hypoxia or hypercapnia: Secondary | ICD-10-CM | POA: Diagnosis not present

## 2017-03-23 DIAGNOSIS — J449 Chronic obstructive pulmonary disease, unspecified: Secondary | ICD-10-CM | POA: Diagnosis not present

## 2017-03-24 DIAGNOSIS — E669 Obesity, unspecified: Secondary | ICD-10-CM | POA: Diagnosis not present

## 2017-03-25 DIAGNOSIS — E669 Obesity, unspecified: Secondary | ICD-10-CM | POA: Diagnosis not present

## 2017-03-26 DIAGNOSIS — E669 Obesity, unspecified: Secondary | ICD-10-CM | POA: Diagnosis not present

## 2017-03-27 DIAGNOSIS — E669 Obesity, unspecified: Secondary | ICD-10-CM | POA: Diagnosis not present

## 2017-03-28 DIAGNOSIS — E669 Obesity, unspecified: Secondary | ICD-10-CM | POA: Diagnosis not present

## 2017-03-29 DIAGNOSIS — E669 Obesity, unspecified: Secondary | ICD-10-CM | POA: Diagnosis not present

## 2017-03-30 DIAGNOSIS — E669 Obesity, unspecified: Secondary | ICD-10-CM | POA: Diagnosis not present

## 2017-03-31 DIAGNOSIS — E669 Obesity, unspecified: Secondary | ICD-10-CM | POA: Diagnosis not present

## 2017-04-01 DIAGNOSIS — E669 Obesity, unspecified: Secondary | ICD-10-CM | POA: Diagnosis not present

## 2017-04-03 DIAGNOSIS — E669 Obesity, unspecified: Secondary | ICD-10-CM | POA: Diagnosis not present

## 2017-04-04 DIAGNOSIS — E669 Obesity, unspecified: Secondary | ICD-10-CM | POA: Diagnosis not present

## 2017-04-05 DIAGNOSIS — E669 Obesity, unspecified: Secondary | ICD-10-CM | POA: Diagnosis not present

## 2017-04-06 DIAGNOSIS — E669 Obesity, unspecified: Secondary | ICD-10-CM | POA: Diagnosis not present

## 2017-04-07 DIAGNOSIS — E669 Obesity, unspecified: Secondary | ICD-10-CM | POA: Diagnosis not present

## 2017-04-08 DIAGNOSIS — E669 Obesity, unspecified: Secondary | ICD-10-CM | POA: Diagnosis not present

## 2017-04-10 DIAGNOSIS — M6281 Muscle weakness (generalized): Secondary | ICD-10-CM | POA: Diagnosis not present

## 2017-04-10 DIAGNOSIS — M5416 Radiculopathy, lumbar region: Secondary | ICD-10-CM | POA: Diagnosis not present

## 2017-04-10 DIAGNOSIS — G8929 Other chronic pain: Secondary | ICD-10-CM | POA: Diagnosis not present

## 2017-04-10 DIAGNOSIS — J9610 Chronic respiratory failure, unspecified whether with hypoxia or hypercapnia: Secondary | ICD-10-CM | POA: Diagnosis not present

## 2017-04-10 DIAGNOSIS — E669 Obesity, unspecified: Secondary | ICD-10-CM | POA: Diagnosis not present

## 2017-04-11 DIAGNOSIS — E669 Obesity, unspecified: Secondary | ICD-10-CM | POA: Diagnosis not present

## 2017-04-12 DIAGNOSIS — E669 Obesity, unspecified: Secondary | ICD-10-CM | POA: Diagnosis not present

## 2017-04-13 DIAGNOSIS — E669 Obesity, unspecified: Secondary | ICD-10-CM | POA: Diagnosis not present

## 2017-04-14 DIAGNOSIS — E669 Obesity, unspecified: Secondary | ICD-10-CM | POA: Diagnosis not present

## 2017-04-15 DIAGNOSIS — E669 Obesity, unspecified: Secondary | ICD-10-CM | POA: Diagnosis not present

## 2017-04-16 DIAGNOSIS — E669 Obesity, unspecified: Secondary | ICD-10-CM | POA: Diagnosis not present

## 2017-04-17 DIAGNOSIS — E669 Obesity, unspecified: Secondary | ICD-10-CM | POA: Diagnosis not present

## 2017-04-18 DIAGNOSIS — E669 Obesity, unspecified: Secondary | ICD-10-CM | POA: Diagnosis not present

## 2017-04-19 DIAGNOSIS — E669 Obesity, unspecified: Secondary | ICD-10-CM | POA: Diagnosis not present

## 2017-04-20 DIAGNOSIS — E669 Obesity, unspecified: Secondary | ICD-10-CM | POA: Diagnosis not present

## 2017-04-21 DIAGNOSIS — E669 Obesity, unspecified: Secondary | ICD-10-CM | POA: Diagnosis not present

## 2017-04-22 DIAGNOSIS — E669 Obesity, unspecified: Secondary | ICD-10-CM | POA: Diagnosis not present

## 2017-04-23 DIAGNOSIS — E669 Obesity, unspecified: Secondary | ICD-10-CM | POA: Diagnosis not present

## 2017-04-24 DIAGNOSIS — E669 Obesity, unspecified: Secondary | ICD-10-CM | POA: Diagnosis not present

## 2017-04-25 DIAGNOSIS — E669 Obesity, unspecified: Secondary | ICD-10-CM | POA: Diagnosis not present

## 2017-04-26 DIAGNOSIS — E669 Obesity, unspecified: Secondary | ICD-10-CM | POA: Diagnosis not present

## 2017-04-27 DIAGNOSIS — E669 Obesity, unspecified: Secondary | ICD-10-CM | POA: Diagnosis not present

## 2017-04-28 DIAGNOSIS — E669 Obesity, unspecified: Secondary | ICD-10-CM | POA: Diagnosis not present

## 2017-04-29 DIAGNOSIS — E669 Obesity, unspecified: Secondary | ICD-10-CM | POA: Diagnosis not present

## 2017-04-30 DIAGNOSIS — E669 Obesity, unspecified: Secondary | ICD-10-CM | POA: Diagnosis not present

## 2017-05-01 DIAGNOSIS — E669 Obesity, unspecified: Secondary | ICD-10-CM | POA: Diagnosis not present

## 2017-05-02 DIAGNOSIS — E669 Obesity, unspecified: Secondary | ICD-10-CM | POA: Diagnosis not present

## 2017-05-03 DIAGNOSIS — E669 Obesity, unspecified: Secondary | ICD-10-CM | POA: Diagnosis not present

## 2017-05-04 DIAGNOSIS — E669 Obesity, unspecified: Secondary | ICD-10-CM | POA: Diagnosis not present

## 2017-05-05 DIAGNOSIS — E669 Obesity, unspecified: Secondary | ICD-10-CM | POA: Diagnosis not present

## 2017-05-06 DIAGNOSIS — E669 Obesity, unspecified: Secondary | ICD-10-CM | POA: Diagnosis not present

## 2017-05-07 DIAGNOSIS — E669 Obesity, unspecified: Secondary | ICD-10-CM | POA: Diagnosis not present

## 2017-05-08 DIAGNOSIS — E669 Obesity, unspecified: Secondary | ICD-10-CM | POA: Diagnosis not present

## 2017-05-09 DIAGNOSIS — E669 Obesity, unspecified: Secondary | ICD-10-CM | POA: Diagnosis not present

## 2017-05-10 DIAGNOSIS — E669 Obesity, unspecified: Secondary | ICD-10-CM | POA: Diagnosis not present

## 2017-05-11 DIAGNOSIS — M6281 Muscle weakness (generalized): Secondary | ICD-10-CM | POA: Diagnosis not present

## 2017-05-11 DIAGNOSIS — G8929 Other chronic pain: Secondary | ICD-10-CM | POA: Diagnosis not present

## 2017-05-11 DIAGNOSIS — J9610 Chronic respiratory failure, unspecified whether with hypoxia or hypercapnia: Secondary | ICD-10-CM | POA: Diagnosis not present

## 2017-05-11 DIAGNOSIS — M5416 Radiculopathy, lumbar region: Secondary | ICD-10-CM | POA: Diagnosis not present

## 2017-05-11 DIAGNOSIS — E669 Obesity, unspecified: Secondary | ICD-10-CM | POA: Diagnosis not present

## 2017-05-12 DIAGNOSIS — E669 Obesity, unspecified: Secondary | ICD-10-CM | POA: Diagnosis not present

## 2017-05-13 DIAGNOSIS — E669 Obesity, unspecified: Secondary | ICD-10-CM | POA: Diagnosis not present

## 2017-05-14 DIAGNOSIS — E669 Obesity, unspecified: Secondary | ICD-10-CM | POA: Diagnosis not present

## 2017-05-15 DIAGNOSIS — E669 Obesity, unspecified: Secondary | ICD-10-CM | POA: Diagnosis not present

## 2017-05-16 DIAGNOSIS — E669 Obesity, unspecified: Secondary | ICD-10-CM | POA: Diagnosis not present

## 2017-05-17 DIAGNOSIS — E669 Obesity, unspecified: Secondary | ICD-10-CM | POA: Diagnosis not present

## 2017-05-18 DIAGNOSIS — E669 Obesity, unspecified: Secondary | ICD-10-CM | POA: Diagnosis not present

## 2017-05-19 DIAGNOSIS — E669 Obesity, unspecified: Secondary | ICD-10-CM | POA: Diagnosis not present

## 2017-05-20 DIAGNOSIS — E669 Obesity, unspecified: Secondary | ICD-10-CM | POA: Diagnosis not present

## 2017-05-21 DIAGNOSIS — E669 Obesity, unspecified: Secondary | ICD-10-CM | POA: Diagnosis not present

## 2017-05-22 DIAGNOSIS — E669 Obesity, unspecified: Secondary | ICD-10-CM | POA: Diagnosis not present

## 2017-05-23 DIAGNOSIS — E669 Obesity, unspecified: Secondary | ICD-10-CM | POA: Diagnosis not present

## 2017-05-24 DIAGNOSIS — E669 Obesity, unspecified: Secondary | ICD-10-CM | POA: Diagnosis not present

## 2017-05-25 DIAGNOSIS — E669 Obesity, unspecified: Secondary | ICD-10-CM | POA: Diagnosis not present

## 2017-05-27 ENCOUNTER — Other Ambulatory Visit: Payer: Self-pay | Admitting: Internal Medicine

## 2017-05-27 DIAGNOSIS — E669 Obesity, unspecified: Secondary | ICD-10-CM | POA: Diagnosis not present

## 2017-05-29 DIAGNOSIS — E669 Obesity, unspecified: Secondary | ICD-10-CM | POA: Diagnosis not present

## 2017-05-30 DIAGNOSIS — E669 Obesity, unspecified: Secondary | ICD-10-CM | POA: Diagnosis not present

## 2017-05-31 ENCOUNTER — Other Ambulatory Visit: Payer: Self-pay | Admitting: Internal Medicine

## 2017-05-31 DIAGNOSIS — E669 Obesity, unspecified: Secondary | ICD-10-CM | POA: Diagnosis not present

## 2017-06-01 DIAGNOSIS — E669 Obesity, unspecified: Secondary | ICD-10-CM | POA: Diagnosis not present

## 2017-06-02 DIAGNOSIS — E669 Obesity, unspecified: Secondary | ICD-10-CM | POA: Diagnosis not present

## 2017-06-03 DIAGNOSIS — E669 Obesity, unspecified: Secondary | ICD-10-CM | POA: Diagnosis not present

## 2017-06-04 DIAGNOSIS — E669 Obesity, unspecified: Secondary | ICD-10-CM | POA: Diagnosis not present

## 2017-06-05 DIAGNOSIS — E669 Obesity, unspecified: Secondary | ICD-10-CM | POA: Diagnosis not present

## 2017-06-06 DIAGNOSIS — E669 Obesity, unspecified: Secondary | ICD-10-CM | POA: Diagnosis not present

## 2017-06-07 DIAGNOSIS — E669 Obesity, unspecified: Secondary | ICD-10-CM | POA: Diagnosis not present

## 2017-06-07 NOTE — Progress Notes (Signed)
x   Subjective:    Patient ID: Shawna Fuentes, female    DOB: 29-Nov-1932, 83 y.o.   MRN: [id number]  HPI The patient is here for follow up.  Hypertension: She is taking her medication daily. She is compliant with a low sodium diet.  She denies chest pain, palpitations,  shortness of breath and regular headaches. She is not exercising regularly.  She does monitor her blood pressure at home - 130's.    Leg edema:  She is taking lasix 20 mg daily.  She feels her leg edema is well controlled with her current dose of furosemide.  Hyperlipidemia: She is taking her medication daily. She is compliant with a low fat/cholesterol diet. She is not exercising regularly. She denies myalgias.   Hypothyroidism:  She is taking her medication daily.  She denies any recent changes in energy or weight that are unexplained.   Hyperglycemia:  She is compliant with a low sugar/carbohydrate diet.  She is not exercising regularly.  OP:  She has had prolia 03/14/17.  She denies any side effects.  She is taking calcium and vitamin D daily.  Medications and allergies reviewed with patient and updated if appropriate.  Patient Active Problem List   Diagnosis Date Noted  . Hyperglycemia 12/05/2016  . Cough 07/10/2016  . CKD (chronic kidney disease) stage 3, GFR 30-59 ml/min (HCC) 06/08/2016  . Urinary incontinence 05/13/2016  . Cramp of both lower extremities 12/05/2015  . Obstructive sleep apnea 09/15/2014  . Osteoporosis 07/15/2014  . Arthritis pain of hand 07/10/2014  . Anemia, iron deficiency 07/10/2014  . Deep vein thrombosis ([place]) 12/24/2013  . Chronic respiratory failure with hypercapnia ([place]) 12/21/2013  . Post-surgical hypothyroidism 12/21/2013  . Hypoxemia 12/20/2013  . Edema 12/12/2013  . Fracture, subtrochanteric, right femur, closed ([place]) 09/22/2013  . Chronic radicular low back pain 12/30/2012  . Osteoarthritis of right knee 12/30/2012  . Hyperlipidemia   . Hypertension     Current  Outpatient Medications on File Prior to Visit  Medication Sig Dispense Refill  . amLODipine (NORVASC) 2.5 MG tablet TAKE 1 TABLET BY MOUTH EVERY DAY 90 tablet 0  . aspirin 81 MG tablet Take 1 tablet (81 mg total) by mouth daily. 30 tablet   . atorvastatin (LIPITOR) 10 MG tablet TAKE 1 TABLET BY MOUTH EVERY EVENING 90 tablet 3  . calcitRIOL (ROCALTROL) 0.25 MCG capsule TAKE 1 CAPSULE (0.25 MCG TOTAL) BY MOUTH 2 (TWO) TIMES DAILY. 180 capsule 1  . calcium carbonate (OS-CAL) 600 MG TABS tablet Take 1 tablet (600 mg total) by mouth 2 (two) times daily with a meal. 180 tablet 3  . famotidine (PEPCID) 20 MG tablet TAKE 1 TABLET BY MOUTH EVERY DAY AT BEDTIME 90 tablet 0  . furosemide (LASIX) 20 MG tablet Take 1 tablet (20 mg total) by mouth daily. 10 tablet 0  . levothyroxine (SYNTHROID, LEVOTHROID) 112 MCG tablet TAKE 1 TABLET BY MOUTH EVERY DAY 90 tablet 0  . losartan (COZAAR) 100 MG tablet Take 1 tablet (100 mg total) by mouth daily. 90 tablet 3  . metoprolol tartrate (LOPRESSOR) 25 MG tablet TAKE 1/2 TABLET BY MOUTH TWICE A DAY 90 tablet 2  . nystatin (MYCOSTATIN/NYSTOP) 100000 UNIT/GM POWD Apply to affected area 3 times a day 60 g 0  . traMADol (ULTRAM) 50 MG tablet Take 1 tablet (50 mg total) by mouth every 8 (eight) hours as needed. 30 tablet 0  . Vitamin D, Ergocalciferol, (DRISDOL) 50000 UNITS CAPS capsule Take 50,000 Units  by mouth.     No current facility-administered medications on file prior to visit.     Past Medical History:  Diagnosis Date  . Arthritis   . DVT (deep venous thrombosis) ([place]) 12/2013   bilateral upper extremity DVT found at time of thyroidectomy   . History of kidney stones   . Hyperlipidemia   . Hypertension   . Hypothyroidism (acquired) 12/2013   s/p [facility]ctomy 12/21/13 at [facility]  . Osteoporosis 07/15/2014   DEXA @ [facility] 07/14/14: -3.1  . Thyroid goiter    s/p thyroidectomy - pathology demonstrated nodular hyperplasia    Past Surgical History:  Procedure  Laterality Date  . FEMUR IM NAIL Right 09/22/2013   Procedure: INTRAMEDULLARY (IM) NAIL FEMORAL;  Surgeon: Wylene Simmer, MD;  Location: [place];  Service: Orthopedics;  Laterality: Right;  . HIP SURGERY    . THYROIDECTOMY WITH STERNOTOMY  12/21/13   thyroid mass found incidentally on CT scan 12/2013; transferred to [facility] for surgical removal of thyroid and goiter requiring sternotomy    Social History   Socioeconomic History  . Marital status: Widowed    Spouse name: None  . Number of children: None  . Years of education: None  . Highest education level: None  Social Needs  . Financial resource strain: None  . Food insecurity - worry: None  . Food insecurity - inability: None  . Transportation needs - medical: None  . Transportation needs - non-medical: None  Occupational History  . Occupation: retired  Tobacco Use  . Smoking status: Never Smoker  . Smokeless tobacco: Never Used  . Tobacco comment: lives w/ dtr dorthea, 2nd dtr and 3 sons in town  Substance and Sexual Activity  . Alcohol use: No    Alcohol/week: 0.0 oz  . Drug use: No  . Sexual activity: None  Other Topics Concern  . 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   Lives with daughter dorthea - has 3 sons and another daughter in town, supportive    Family History  Problem Relation Age of Onset  . Osteoarthritis Mother   . Hypertension Mother   . Hypertension Father   . Cancer Father        unknown type  . Diabetes Son   . Diabetes Son   . Clotting disorder Unknown   . Rheumatologic disease Unknown     Review of Systems  Constitutional: Negative for chills and fever.  Respiratory: Negative for cough, shortness of breath and wheezing.   Cardiovascular: Positive for leg swelling. Negative for chest pain and palpitations.  Neurological: Positive for headaches (occ). Negative for dizziness and light-headedness.        Objective:   Vitals:   06/08/17 0946  BP: (!) 154/88  Pulse: 77  Resp: 16  Temp: 98.1 F (36.7 C)  SpO2: 95%   Wt Readings from Last 3 Encounters:  06/08/17 256 lb (116.1 kg)  12/06/16 256 lb (116.1 kg)  12/06/16 255 lb 9.6 oz (115.9 kg)   Body mass index is 45.35 kg/m.   Physical Exam    Constitutional: Appears well-developed and well-nourished. No distress.  HENT:  Head: Normocephalic and atraumatic.  Neck: Neck supple. No tracheal deviation present. No thyromegaly present.  No cervical lymphadenopathy Cardiovascular: Normal rate, regular rhythm and normal heart sounds.   No murmur heard. No carotid bruit .   1+ bilateral lower extremity pitting edema Pulmonary/Chest:  Effort normal and breath sounds normal. No respiratory distress. No has no wheezes. No rales.  Skin: Skin is warm and dry. Not diaphoretic.  Psychiatric: Normal mood and affect. Behavior is normal.      Assessment & Plan:    See Problem List for Assessment and Plan of chronic medical problems.

## 2017-06-08 ENCOUNTER — Ambulatory Visit (INDEPENDENT_AMBULATORY_CARE_PROVIDER_SITE_OTHER): Payer: Medicare HMO | Admitting: Internal Medicine

## 2017-06-08 ENCOUNTER — Other Ambulatory Visit (INDEPENDENT_AMBULATORY_CARE_PROVIDER_SITE_OTHER): Payer: Medicare HMO

## 2017-06-08 ENCOUNTER — Encounter: Payer: Self-pay | Admitting: Internal Medicine

## 2017-06-08 VITALS — BP 154/88 | HR 77 | Temp 98.1°F | Resp 16 | Wt 256.0 lb

## 2017-06-08 DIAGNOSIS — Z23 Encounter for immunization: Secondary | ICD-10-CM

## 2017-06-08 DIAGNOSIS — E7849 Other hyperlipidemia: Secondary | ICD-10-CM | POA: Diagnosis not present

## 2017-06-08 DIAGNOSIS — R739 Hyperglycemia, unspecified: Secondary | ICD-10-CM | POA: Diagnosis not present

## 2017-06-08 DIAGNOSIS — E669 Obesity, unspecified: Secondary | ICD-10-CM | POA: Diagnosis not present

## 2017-06-08 DIAGNOSIS — R600 Localized edema: Secondary | ICD-10-CM

## 2017-06-08 DIAGNOSIS — E890 Postprocedural hypothyroidism: Secondary | ICD-10-CM

## 2017-06-08 DIAGNOSIS — I10 Essential (primary) hypertension: Secondary | ICD-10-CM

## 2017-06-08 LAB — LIPID PANEL
CHOLESTEROL: 149 mg/dL (ref 0–200)
HDL: 65.2 mg/dL (ref >39.00)
LDL CALC: 71 mg/dL (ref 0–99)
NonHDL: 83.37
TRIGLYCERIDES: 60 mg/dL (ref 0.0–149.0)
Total CHOL/HDL Ratio: 2
VLDL: 12 mg/dL (ref 0.0–40.0)

## 2017-06-08 LAB — COMPREHENSIVE METABOLIC PANEL
ALT: 15 U/L (ref 0–35)
AST: 17 U/L (ref 0–37)
Albumin: 4.1 g/dL (ref 3.5–5.2)
Alkaline Phosphatase: 70 U/L (ref 39–117)
BUN: 30 mg/dL — ABNORMAL HIGH (ref 6–23)
CALCIUM: 8.9 mg/dL (ref 8.4–10.5)
CHLORIDE: 103 meq/L (ref 96–112)
CO2: 30 meq/L (ref 19–32)
CREATININE: 1.21 mg/dL — AB (ref 0.40–1.20)
GFR: 54.72 mL/min — AB (ref >60.00)
Glucose, Bld: 92 mg/dL (ref 70–99)
POTASSIUM: 4.6 meq/L (ref 3.5–5.1)
Sodium: 142 mEq/L (ref 135–145)
Total Bilirubin: 0.9 mg/dL (ref 0.2–1.2)
Total Protein: 7.7 g/dL (ref 6.0–8.3)

## 2017-06-08 LAB — CBC WITH DIFFERENTIAL/PLATELET
BASOS PCT: 1.1 % (ref 0.0–3.0)
Basophils Absolute: 0.1 10*3/uL (ref 0.0–0.1)
EOS PCT: 3.3 % (ref 0.0–5.0)
Eosinophils Absolute: 0.2 10*3/uL (ref 0.0–0.7)
HEMATOCRIT: 37.9 % (ref 36.0–46.0)
HEMOGLOBIN: 12.2 g/dL (ref 12.0–15.0)
LYMPHS PCT: 32.4 % (ref 12.0–46.0)
Lymphs Abs: 1.8 10*3/uL (ref 0.7–4.0)
MCHC: 32.2 g/dL (ref 30.0–36.0)
MCV: 90.7 fl (ref 78.0–100.0)
MONOS PCT: 6.9 % (ref 3.0–12.0)
Monocytes Absolute: 0.4 10*3/uL (ref 0.1–1.0)
NEUTROS ABS: 3.2 10*3/uL (ref 1.4–7.7)
Neutrophils Relative %: 56.3 % (ref 43.0–77.0)
PLATELETS: 160 10*3/uL (ref 150.0–400.0)
RBC: 4.18 Mil/uL (ref 3.87–5.11)
RDW: 14.6 % (ref 11.5–15.5)
WBC: 5.7 10*3/uL (ref 4.0–10.5)

## 2017-06-08 LAB — TSH: TSH: 0.27 u[IU]/mL — AB (ref 0.35–4.50)

## 2017-06-08 LAB — HEMOGLOBIN A1C: HEMOGLOBIN A1C: 5.7 % (ref 4.6–6.5)

## 2017-06-08 NOTE — Patient Instructions (Signed)

## 2017-06-08 NOTE — Assessment & Plan Note (Signed)
We will check A1c ?

## 2017-06-08 NOTE — Assessment & Plan Note (Signed)
Check lipid panel, CMP Continue statin 

## 2017-06-08 NOTE — Assessment & Plan Note (Signed)
Leg edema controlled Continue Lasix 20 mg daily

## 2017-06-08 NOTE — Assessment & Plan Note (Signed)
Check TSH We will adjust medication if needed

## 2017-06-08 NOTE — Assessment & Plan Note (Signed)
She has not taken her medication yet today because she was coming here ?  Blood pressure controlled  Her blood pressure when she checks it at home is in the 917H systolically so it does appear to be controlled We will continue current medications Encouraged her to continue to monitor it CMP, TSH, CBC

## 2017-06-09 ENCOUNTER — Other Ambulatory Visit: Payer: Self-pay | Admitting: Emergency Medicine

## 2017-06-09 DIAGNOSIS — E669 Obesity, unspecified: Secondary | ICD-10-CM | POA: Diagnosis not present

## 2017-06-09 MED ORDER — LEVOTHYROXINE SODIUM 100 MCG PO TABS: 100.0000 ug | ORAL_TABLET | Freq: Every day | ORAL | 1 refills | Status: DC

## 2017-06-10 DIAGNOSIS — J9610 Chronic respiratory failure, unspecified whether with hypoxia or hypercapnia: Secondary | ICD-10-CM | POA: Diagnosis not present

## 2017-06-10 DIAGNOSIS — E669 Obesity, unspecified: Secondary | ICD-10-CM | POA: Diagnosis not present

## 2017-06-11 DIAGNOSIS — E669 Obesity, unspecified: Secondary | ICD-10-CM | POA: Diagnosis not present

## 2017-06-15 DIAGNOSIS — E669 Obesity, unspecified: Secondary | ICD-10-CM | POA: Diagnosis not present

## 2017-06-16 ENCOUNTER — Other Ambulatory Visit: Payer: Self-pay | Admitting: Internal Medicine

## 2017-06-16 DIAGNOSIS — E669 Obesity, unspecified: Secondary | ICD-10-CM | POA: Diagnosis not present

## 2017-06-17 DIAGNOSIS — E669 Obesity, unspecified: Secondary | ICD-10-CM | POA: Diagnosis not present

## 2017-06-18 DIAGNOSIS — E669 Obesity, unspecified: Secondary | ICD-10-CM | POA: Diagnosis not present

## 2017-06-19 DIAGNOSIS — E669 Obesity, unspecified: Secondary | ICD-10-CM | POA: Diagnosis not present

## 2017-06-20 DIAGNOSIS — E669 Obesity, unspecified: Secondary | ICD-10-CM | POA: Diagnosis not present

## 2017-06-21 DIAGNOSIS — E669 Obesity, unspecified: Secondary | ICD-10-CM | POA: Diagnosis not present

## 2017-06-22 DIAGNOSIS — E669 Obesity, unspecified: Secondary | ICD-10-CM | POA: Diagnosis not present

## 2017-06-22 DIAGNOSIS — J9610 Chronic respiratory failure, unspecified whether with hypoxia or hypercapnia: Secondary | ICD-10-CM | POA: Diagnosis not present

## 2017-06-23 DIAGNOSIS — E669 Obesity, unspecified: Secondary | ICD-10-CM | POA: Diagnosis not present

## 2017-06-24 DIAGNOSIS — E669 Obesity, unspecified: Secondary | ICD-10-CM | POA: Diagnosis not present

## 2017-06-25 DIAGNOSIS — E669 Obesity, unspecified: Secondary | ICD-10-CM | POA: Diagnosis not present

## 2017-06-26 DIAGNOSIS — E669 Obesity, unspecified: Secondary | ICD-10-CM | POA: Diagnosis not present

## 2017-06-27 ENCOUNTER — Telehealth: Payer: Self-pay | Admitting: Internal Medicine

## 2017-06-27 DIAGNOSIS — E669 Obesity, unspecified: Secondary | ICD-10-CM | POA: Diagnosis not present

## 2017-06-27 NOTE — Telephone Encounter (Signed)
Called and spoke with Johnnette Gourd with [facility], who request our office fax number to send over [place]. Johnnette Gourd has been provided with fax number.  Nothing further is needed.

## 2017-06-29 DIAGNOSIS — E669 Obesity, unspecified: Secondary | ICD-10-CM | POA: Diagnosis not present

## 2017-06-30 DIAGNOSIS — E669 Obesity, unspecified: Secondary | ICD-10-CM | POA: Diagnosis not present

## 2017-07-01 DIAGNOSIS — E669 Obesity, unspecified: Secondary | ICD-10-CM | POA: Diagnosis not present

## 2017-07-02 DIAGNOSIS — E669 Obesity, unspecified: Secondary | ICD-10-CM | POA: Diagnosis not present

## 2017-07-03 DIAGNOSIS — E669 Obesity, unspecified: Secondary | ICD-10-CM | POA: Diagnosis not present

## 2017-07-04 DIAGNOSIS — E669 Obesity, unspecified: Secondary | ICD-10-CM | POA: Diagnosis not present

## 2017-07-06 DIAGNOSIS — E669 Obesity, unspecified: Secondary | ICD-10-CM | POA: Diagnosis not present

## 2017-07-07 DIAGNOSIS — E669 Obesity, unspecified: Secondary | ICD-10-CM | POA: Diagnosis not present

## 2017-07-08 DIAGNOSIS — E669 Obesity, unspecified: Secondary | ICD-10-CM | POA: Diagnosis not present

## 2017-07-09 DIAGNOSIS — E669 Obesity, unspecified: Secondary | ICD-10-CM | POA: Diagnosis not present

## 2017-07-10 DIAGNOSIS — E669 Obesity, unspecified: Secondary | ICD-10-CM | POA: Diagnosis not present

## 2017-07-11 DIAGNOSIS — E669 Obesity, unspecified: Secondary | ICD-10-CM | POA: Diagnosis not present

## 2017-07-12 ENCOUNTER — Other Ambulatory Visit: Payer: Self-pay | Admitting: Internal Medicine

## 2017-07-12 DIAGNOSIS — E669 Obesity, unspecified: Secondary | ICD-10-CM | POA: Diagnosis not present

## 2017-07-13 DIAGNOSIS — E669 Obesity, unspecified: Secondary | ICD-10-CM | POA: Diagnosis not present

## 2017-07-14 DIAGNOSIS — E669 Obesity, unspecified: Secondary | ICD-10-CM | POA: Diagnosis not present

## 2017-07-16 DIAGNOSIS — E669 Obesity, unspecified: Secondary | ICD-10-CM | POA: Diagnosis not present

## 2017-07-18 DIAGNOSIS — E669 Obesity, unspecified: Secondary | ICD-10-CM | POA: Diagnosis not present

## 2017-07-19 DIAGNOSIS — E669 Obesity, unspecified: Secondary | ICD-10-CM | POA: Diagnosis not present

## 2017-07-20 ENCOUNTER — Ambulatory Visit (INDEPENDENT_AMBULATORY_CARE_PROVIDER_SITE_OTHER): Payer: Medicare HMO | Admitting: Adult Health

## 2017-07-20 ENCOUNTER — Encounter: Payer: Self-pay | Admitting: Adult Health

## 2017-07-20 DIAGNOSIS — G4733 Obstructive sleep apnea (adult) (pediatric): Secondary | ICD-10-CM | POA: Diagnosis not present

## 2017-07-20 DIAGNOSIS — J9612 Chronic respiratory failure with hypercapnia: Secondary | ICD-10-CM | POA: Diagnosis not present

## 2017-07-20 DIAGNOSIS — E669 Obesity, unspecified: Secondary | ICD-10-CM | POA: Diagnosis not present

## 2017-07-20 NOTE — Addendum Note (Signed)
Addended by: Elton Sin on: 07/20/2017 11:15 AM   Modules accepted: Orders

## 2017-07-20 NOTE — Assessment & Plan Note (Signed)
Hypercarbic respiratory failure on nocturnal trilogy machine with oxygen.  Per insurance requirements will need overnight oximetry test on room air with trilogy machine.  Plan Patient Instructions  Continue on Trilogy Vent At bedtime   Set up overnight oximetry test on room air while on Trilogy Vent At bedtime  .  follow up  Dr. Annamaria Boots  In 1 year and As needed

## 2017-07-20 NOTE — Progress Notes (Signed)
@Patient  ID: Shawna Fuentes, female    DOB: 1933/05/16, 84 y.o.   MRN: [id number]  Chief Complaint  Patient presents with  . Follow-up    Referring provider: Binnie Rail, MD  HPI:   Allergies  Allergen Reactions  . Oxycodone Other (See Comments)    Unknown goofiness  . Tetanus Toxoid Adsorbed Other (See Comments)    unknown  . Tetanus Toxoids     Pt declines / states she is not sure why / discussed out of pocket and may wait until resources are saved    Immunization History  Administered Date(s) Administered  . Influenza, High Dose Seasonal PF 04/20/2013, 04/09/2014, 06/04/2015, 05/21/2016, 06/08/2017  . Pneumococcal Conjugate-13 07/10/2014  . Pneumococcal-Unspecified 07/05/2006    Past Medical History:  Diagnosis Date  . Arthritis   . DVT (deep venous thrombosis) ([place]) 12/2013   bilateral upper extremity DVT found at time of thyroidectomy   . History of kidney stones   . Hyperlipidemia   . Hypertension   . Hypothyroidism (acquired) 12/2013   s/p [facility]ctomy 12/21/13 at [facility]  . Osteoporosis 07/15/2014   DEXA @ [facility] 07/14/14: -3.1  . Thyroid goiter    s/p thyroidectomy - pathology demonstrated nodular hyperplasia    Tobacco History: Social History   Tobacco Use  Smoking Status Never Smoker  Smokeless Tobacco Never Used  Tobacco Comment   lives w/ dtr dorthea, 2nd dtr and 3 sons in town   Counseling given: Not Answered Comment: lives w/ dtr dorthea, 2nd dtr and 3 sons in town   Outpatient Encounter Medications as of 07/20/2017  Medication Sig  . amLODipine (NORVASC) 2.5 MG tablet TAKE 1 TABLET BY MOUTH EVERY DAY  . aspirin 81 MG tablet Take 1 tablet (81 mg total) by mouth daily.  Marland Kitchen atorvastatin (LIPITOR) 10 MG tablet TAKE 1 TABLET BY MOUTH EVERY EVENING  . calcitRIOL (ROCALTROL) 0.25 MCG capsule TAKE 1 CAPSULE (0.25 MCG TOTAL) BY MOUTH 2 (TWO) TIMES DAILY.  . calcium carbonate (OS-CAL) 600 MG TABS tablet Take 1 tablet (600 mg total) by mouth 2 (two)  times daily with a meal.  . famotidine (PEPCID) 20 MG tablet TAKE 1 TABLET BY MOUTH EVERY DAY AT BEDTIME  . furosemide (LASIX) 20 MG tablet Take 1 tablet (20 mg total) by mouth daily.  Marland Kitchen levothyroxine (SYNTHROID, LEVOTHROID) 100 MCG tablet Take 1 tablet (100 mcg total) by mouth daily.  Marland Kitchen losartan (COZAAR) 100 MG tablet Take 1 tablet (100 mg total) by mouth daily.  . metoprolol tartrate (LOPRESSOR) 25 MG tablet TAKE 1/2 TABLET BY MOUTH TWICE A DAY  . traMADol (ULTRAM) 50 MG tablet Take 1 tablet (50 mg total) by mouth every 8 (eight) hours as needed.  . Vitamin D, Ergocalciferol, (DRISDOL) 50000 UNITS CAPS capsule Take 50,000 Units by mouth every 7 (seven) days.   Marland Kitchen nystatin (MYCOSTATIN/NYSTOP) 100000 UNIT/GM POWD Apply to affected area 3 times a day (Patient not taking: Reported on 07/20/2017)  . [DISCONTINUED] furosemide (LASIX) 20 MG tablet TAKE 1 TABLET BY MOUTH EVERY DAY   No facility-administered encounter medications on file as of 07/20/2017.      Review of Systems  Constitutional:   No  weight loss, night sweats,  Fevers, chills, fatigue, or  lassitude.  HEENT:   No headaches,  Difficulty swallowing,  Tooth/dental problems, or  Sore throat,                No sneezing, itching, ear ache, nasal congestion, post nasal drip,  CV:  No chest pain,  Orthopnea, PND, swelling in lower extremities, anasarca, dizziness, palpitations, syncope.   GI  No heartburn, indigestion, abdominal pain, nausea, vomiting, diarrhea, change in bowel habits, loss of appetite, bloody stools.   Resp: No shortness of breath with exertion or at rest.  No excess mucus, no productive cough,  No non-productive cough,  No coughing up of blood.  No change in color of mucus.  No wheezing.  No chest wall deformity  Skin: no rash or lesions.  GU: no dysuria, change in color of urine, no urgency or frequency.  No flank pain, no hematuria   MS:  No joint pain or swelling.  No decreased range of motion.  No back  pain.    Physical Exam  BP 104/74 (BP Location: Left Arm, Cuff Size: Normal)   Pulse 60   Ht 5\' 3"  (1.6 m)   Wt 258 lb 1.6 oz (117.1 kg)   SpO2 100%   BMI 45.72 kg/m   GEN: A/Ox3; pleasant , NAD, well nourished    HEENT:  [place]/AT,  EACs-clear, TMs-wnl, NOSE-clear, THROAT-clear, no lesions, no postnasal drip or exudate noted.   NECK:  Supple w/ fair ROM; no JVD; normal carotid impulses w/o bruits; no thyromegaly or nodules palpated; no lymphadenopathy.    RESP  Clear  P & A; w/o, wheezes/ rales/ or rhonchi. no accessory muscle use, no dullness to percussion  CARD:  RRR, no m/r/g, no peripheral edema, pulses intact, no cyanosis or clubbing.  GI:   Soft & nt; nml bowel sounds; no organomegaly or masses detected.   Musco: Warm bil, no deformities or joint swelling noted.   Neuro: alert, no focal deficits noted.    Skin: Warm, no lesions or rashes    Lab Results:  CBC    Component Value Date/Time   WBC 5.7 06/08/2017 1021   RBC 4.18 06/08/2017 1021   HGB 12.2 06/08/2017 1021   HCT 37.9 06/08/2017 1021   PLT 160.0 06/08/2017 1021   MCV 90.7 06/08/2017 1021   MCH 25.6 (L) 12/20/2013 1823   MCHC 32.2 06/08/2017 1021   RDW 14.6 06/08/2017 1021   LYMPHSABS 1.8 06/08/2017 1021   MONOABS 0.4 06/08/2017 1021   EOSABS 0.2 06/08/2017 1021   BASOSABS 0.1 06/08/2017 1021    BMET    Component Value Date/Time   NA 142 06/08/2017 1021   NA 143 05/25/2012   K 4.6 06/08/2017 1021   CL 103 06/08/2017 1021   CO2 30 06/08/2017 1021   GLUCOSE 92 06/08/2017 1021   BUN 30 (H) 06/08/2017 1021   BUN 26 (A) 05/25/2012   CREATININE 1.21 (H) 06/08/2017 1021   CALCIUM 8.9 06/08/2017 1021   GFRNONAA 59 (L) 12/20/2013 1823   GFRAA 68 (L) 12/20/2013 1823    BNP No results found for: BNP  ProBNP    Component Value Date/Time   PROBNP 1,286.0 (H) 12/20/2013 1823    Imaging: No results found.   Assessment & Plan:   No problem-specific Assessment & Plan notes found for this  encounter.     Rexene Edison, NP 07/20/2017

## 2017-07-20 NOTE — Progress Notes (Signed)
@Patient  ID: Shawna Fuentes, female    DOB: 09/28/1932, 84 y.o.   MRN: [id number]  Chief Complaint  Patient presents with  . Follow-up    OSA     Referring provider: Binnie Rail, MD  HPI: 84 year old female never smoker followed for obstructive sleep apnea OHS with hypercarbic respiratory failure and chronic right diaphragm elevation on Trilogy NIV mode -AVAP-AE  AVAPS rate 3, Vt 330, PS max 30, min 8, EPAP max 15/ min 4, ramp off,   TEST  ABG on 2 L oxygen 07/09/2014-pH 7.36, PCO2 52.8, PO2 101, HCO3 29.6 Walk test on room air-08/07/15-held saturation 97% on room air, resting saturation on room air 97%. Walked slowly with a walker 185 feet. ABG on room air 08/07/2015-pH 7.40, PCO2 49, PO2 67.5, HCO3 29.9, saturation 92.1%.  07/20/2017 Follow up: OSA/OHS w/ hyercarbic RF and chronic Right Diaphragm on Trilogy At bedtime   Patient presents for a six-month follow-up.  Patient remains on trilogy NIV machine at bedtime.  She uses a full face mask.  Says that she never misses a night.  She has denies any significant daytime sleepiness.  Insurance is requiring her to have an overnight oximetry on room air with her trilogy machine.  She currently uses her trilogy machine with 2 L of oxygen at bedtime. Patient was on 2 L of continuous oxygen 1-2 years ago but has since been discontinued and does not have any daytime desaturations. Patient says overall her breathing is doing okay.  She denies any increased cough or shortness of breath.   Allergies  Allergen Reactions  . Oxycodone Other (See Comments)    Unknown goofiness  . Tetanus Toxoid Adsorbed Other (See Comments)    unknown  . Tetanus Toxoids     Pt declines / states she is not sure why / discussed out of pocket and may wait until resources are saved    Immunization History  Administered Date(s) Administered  . Influenza, High Dose Seasonal PF 04/20/2013, 04/09/2014, 06/04/2015, 05/21/2016, 06/08/2017  . Pneumococcal Conjugate-13  07/10/2014  . Pneumococcal-Unspecified 07/05/2006    Past Medical History:  Diagnosis Date  . Arthritis   . DVT (deep venous thrombosis) ([place]) 12/2013   bilateral upper extremity DVT found at time of thyroidectomy   . History of kidney stones   . Hyperlipidemia   . Hypertension   . Hypothyroidism (acquired) 12/2013   s/p thyroidectomy 12/21/13 at [facility]  . Osteoporosis 07/15/2014   DEXA @ [facility] 07/14/14: -3.1  . Thyroid goiter    s/p thyroidectomy - pathology demonstrated nodular hyperplasia    Tobacco History: Social History   Tobacco Use  Smoking Status Never Smoker  Smokeless Tobacco Never Used  Tobacco Comment   lives w/ dtr dorthea, 2nd dtr and 3 sons in town   Counseling given: Not Answered Comment: lives w/ dtr dorthea, 2nd dtr and 3 sons in town   Outpatient Encounter Medications as of 07/20/2017  Medication Sig  . amLODipine (NORVASC) 2.5 MG tablet TAKE 1 TABLET BY MOUTH EVERY DAY  . aspirin 81 MG tablet Take 1 tablet (81 mg total) by mouth daily.  Marland Kitchen atorvastatin (LIPITOR) 10 MG tablet TAKE 1 TABLET BY MOUTH EVERY EVENING  . calcitRIOL (ROCALTROL) 0.25 MCG capsule TAKE 1 CAPSULE (0.25 MCG TOTAL) BY MOUTH 2 (TWO) TIMES DAILY.  . calcium carbonate (OS-CAL) 600 MG TABS tablet Take 1 tablet (600 mg total) by mouth 2 (two) times daily with a meal.  . famotidine (PEPCID) 20 MG  tablet TAKE 1 TABLET BY MOUTH EVERY DAY AT BEDTIME  . furosemide (LASIX) 20 MG tablet Take 1 tablet (20 mg total) by mouth daily.  Marland Kitchen levothyroxine (SYNTHROID, LEVOTHROID) 100 MCG tablet Take 1 tablet (100 mcg total) by mouth daily.  Marland Kitchen losartan (COZAAR) 100 MG tablet Take 1 tablet (100 mg total) by mouth daily.  . metoprolol tartrate (LOPRESSOR) 25 MG tablet TAKE 1/2 TABLET BY MOUTH TWICE A DAY  . traMADol (ULTRAM) 50 MG tablet Take 1 tablet (50 mg total) by mouth every 8 (eight) hours as needed.  . Vitamin D, Ergocalciferol, (DRISDOL) 50000 UNITS CAPS capsule Take 50,000 Units by mouth every 7 (seven)  days.   Marland Kitchen nystatin (MYCOSTATIN/NYSTOP) 100000 UNIT/GM POWD Apply to affected area 3 times a day (Patient not taking: Reported on 07/20/2017)  . [DISCONTINUED] furosemide (LASIX) 20 MG tablet TAKE 1 TABLET BY MOUTH EVERY DAY   No facility-administered encounter medications on file as of 07/20/2017.      Review of Systems  Constitutional:   No  weight loss, night sweats,  Fevers, chills + fatigue, or  lassitude.  HEENT:   No headaches,  Difficulty swallowing,  Tooth/dental problems, or  Sore throat,                No sneezing, itching, ear ache, nasal congestion, post nasal drip,   CV:  No chest pain,  Orthopnea, PND,  anasarca, dizziness, palpitations, syncope.   GI  No heartburn, indigestion, abdominal pain, nausea, vomiting, diarrhea, change in bowel habits, loss of appetite, bloody stools.   Resp: No excess mucus, no productive cough,  No non-productive cough,  No coughing up of blood.  No change in color of mucus.  No wheezing.  No chest wall deformity  Skin: no rash or lesions.  GU: no dysuria, change in color of urine, no urgency or frequency.  No flank pain, no hematuria   MS:  No joint pain or swelling.  No decreased range of motion.  No back pain.    Physical Exam  BP 104/74 (BP Location: Left Arm, Cuff Size: Normal)   Pulse 60   Ht 5\' 3"  (1.6 m)   Wt 258 lb 1.6 oz (117.1 kg)   SpO2 100%   BMI 45.72 kg/m   GEN: A/Ox3; pleasant , NAD, morbidly obese   HEENT:  [place]/AT,  EACs-clear, TMs-wnl, NOSE-clear, THROAT-clear, no lesions, no postnasal drip or exudate noted.  Class III MP airway  NECK:  Supple w/ fair ROM; no JVD; normal carotid impulses w/o bruits; no thyromegaly or nodules palpated; no lymphadenopathy.    RESP  Clear  P & A; w/o, wheezes/ rales/ or rhonchi. no accessory muscle use, no dullness to percussion  CARD:  RRR, no m/r/g, trace peripheral edema, pulses intact, no cyanosis or clubbing.  GI:   Soft & nt; nml bowel sounds; no organomegaly or masses  detected.   Musco: Warm bil, no deformities or joint swelling noted.   Neuro: alert, no focal deficits noted.    Skin: Warm, no lesions or rashes    Lab Results:  CBC   BNP No results found for: BNP  ProBNP  No results found.   Assessment & Plan:   Chronic respiratory failure with hypercapnia (HCC) Hypercarbic respiratory failure on nocturnal trilogy machine with oxygen.  Per insurance requirements will need overnight oximetry test on room air with trilogy machine.  Plan Patient Instructions  Continue on Trilogy Vent At bedtime   Set up overnight oximetry test  on room air while on Trilogy Vent At bedtime  .  follow up  Dr. Annamaria Boots  In 1 year and As needed       Obstructive sleep apnea Cont on Trilogy NIV Machine w/ O2  Check ONO to make sure O2 is needed per DME /Insurance Requirment .      Rexene Edison, NP 07/20/2017

## 2017-07-20 NOTE — Assessment & Plan Note (Signed)
Cont on Trilogy NIV Machine w/ O2  Check ONO to make sure O2 is needed per DME /Insurance Requirment .

## 2017-07-20 NOTE — Patient Instructions (Signed)
Continue on Trilogy Vent At bedtime   Set up overnight oximetry test on room air while on Trilogy Vent At bedtime  .  follow up  Dr. Annamaria Boots  In 1 year and As needed

## 2017-07-21 DIAGNOSIS — E669 Obesity, unspecified: Secondary | ICD-10-CM | POA: Diagnosis not present

## 2017-07-22 DIAGNOSIS — E669 Obesity, unspecified: Secondary | ICD-10-CM | POA: Diagnosis not present

## 2017-07-22 DIAGNOSIS — J9610 Chronic respiratory failure, unspecified whether with hypoxia or hypercapnia: Secondary | ICD-10-CM | POA: Diagnosis not present

## 2017-07-22 DIAGNOSIS — J449 Chronic obstructive pulmonary disease, unspecified: Secondary | ICD-10-CM | POA: Diagnosis not present

## 2017-07-23 DIAGNOSIS — E669 Obesity, unspecified: Secondary | ICD-10-CM | POA: Diagnosis not present

## 2017-07-23 DIAGNOSIS — J9610 Chronic respiratory failure, unspecified whether with hypoxia or hypercapnia: Secondary | ICD-10-CM | POA: Diagnosis not present

## 2017-07-24 DIAGNOSIS — E669 Obesity, unspecified: Secondary | ICD-10-CM | POA: Diagnosis not present

## 2017-07-26 ENCOUNTER — Telehealth: Payer: Self-pay | Admitting: Adult Health

## 2017-07-26 DIAGNOSIS — J9612 Chronic respiratory failure with hypercapnia: Secondary | ICD-10-CM

## 2017-07-26 DIAGNOSIS — E669 Obesity, unspecified: Secondary | ICD-10-CM | POA: Diagnosis not present

## 2017-07-26 NOTE — Telephone Encounter (Signed)
Pt seen by TP on 1.16.18 ONO on room air with Trilogy vent to assess O2 needs  Received ONO results Reviewed by TP: minimal desat <88% on BiPAP.  Only approximately 2.1min intervals.  No O2 indicated with BiPAP at this time.  Called spoke with patient and discussed ONO results/recommendations.   Pt voiced her understanding.   Order sent to [employer] to d/c O2 at bedtime - pt aware to continue BiPAP at bedtime and with naps  ONO results sent for scan

## 2017-07-27 ENCOUNTER — Other Ambulatory Visit: Payer: Self-pay | Admitting: Internal Medicine

## 2017-07-27 DIAGNOSIS — E669 Obesity, unspecified: Secondary | ICD-10-CM | POA: Diagnosis not present

## 2017-07-28 DIAGNOSIS — E669 Obesity, unspecified: Secondary | ICD-10-CM | POA: Diagnosis not present

## 2017-07-29 DIAGNOSIS — E669 Obesity, unspecified: Secondary | ICD-10-CM | POA: Diagnosis not present

## 2017-07-30 DIAGNOSIS — E669 Obesity, unspecified: Secondary | ICD-10-CM | POA: Diagnosis not present

## 2017-07-31 DIAGNOSIS — E669 Obesity, unspecified: Secondary | ICD-10-CM | POA: Diagnosis not present

## 2017-08-01 DIAGNOSIS — E669 Obesity, unspecified: Secondary | ICD-10-CM | POA: Diagnosis not present

## 2017-08-02 DIAGNOSIS — E669 Obesity, unspecified: Secondary | ICD-10-CM | POA: Diagnosis not present

## 2017-08-03 DIAGNOSIS — E669 Obesity, unspecified: Secondary | ICD-10-CM | POA: Diagnosis not present

## 2017-08-04 DIAGNOSIS — E669 Obesity, unspecified: Secondary | ICD-10-CM | POA: Diagnosis not present

## 2017-08-05 DIAGNOSIS — E669 Obesity, unspecified: Secondary | ICD-10-CM | POA: Diagnosis not present

## 2017-08-06 DIAGNOSIS — E669 Obesity, unspecified: Secondary | ICD-10-CM | POA: Diagnosis not present

## 2017-08-07 DIAGNOSIS — E669 Obesity, unspecified: Secondary | ICD-10-CM | POA: Diagnosis not present

## 2017-08-08 DIAGNOSIS — E669 Obesity, unspecified: Secondary | ICD-10-CM | POA: Diagnosis not present

## 2017-08-09 DIAGNOSIS — E669 Obesity, unspecified: Secondary | ICD-10-CM | POA: Diagnosis not present

## 2017-08-10 DIAGNOSIS — E669 Obesity, unspecified: Secondary | ICD-10-CM | POA: Diagnosis not present

## 2017-08-11 DIAGNOSIS — E669 Obesity, unspecified: Secondary | ICD-10-CM | POA: Diagnosis not present

## 2017-08-12 DIAGNOSIS — E669 Obesity, unspecified: Secondary | ICD-10-CM | POA: Diagnosis not present

## 2017-08-13 DIAGNOSIS — E669 Obesity, unspecified: Secondary | ICD-10-CM | POA: Diagnosis not present

## 2017-08-14 DIAGNOSIS — E669 Obesity, unspecified: Secondary | ICD-10-CM | POA: Diagnosis not present

## 2017-08-15 DIAGNOSIS — E669 Obesity, unspecified: Secondary | ICD-10-CM | POA: Diagnosis not present

## 2017-08-16 DIAGNOSIS — E669 Obesity, unspecified: Secondary | ICD-10-CM | POA: Diagnosis not present

## 2017-08-17 DIAGNOSIS — E669 Obesity, unspecified: Secondary | ICD-10-CM | POA: Diagnosis not present

## 2017-08-18 DIAGNOSIS — E669 Obesity, unspecified: Secondary | ICD-10-CM | POA: Diagnosis not present

## 2017-08-19 DIAGNOSIS — E669 Obesity, unspecified: Secondary | ICD-10-CM | POA: Diagnosis not present

## 2017-08-20 DIAGNOSIS — E669 Obesity, unspecified: Secondary | ICD-10-CM | POA: Diagnosis not present

## 2017-08-21 DIAGNOSIS — E669 Obesity, unspecified: Secondary | ICD-10-CM | POA: Diagnosis not present

## 2017-08-22 DIAGNOSIS — E669 Obesity, unspecified: Secondary | ICD-10-CM | POA: Diagnosis not present

## 2017-08-23 DIAGNOSIS — E669 Obesity, unspecified: Secondary | ICD-10-CM | POA: Diagnosis not present

## 2017-08-23 DIAGNOSIS — J9610 Chronic respiratory failure, unspecified whether with hypoxia or hypercapnia: Secondary | ICD-10-CM | POA: Diagnosis not present

## 2017-08-24 ENCOUNTER — Other Ambulatory Visit: Payer: Self-pay | Admitting: Emergency Medicine

## 2017-08-24 DIAGNOSIS — E669 Obesity, unspecified: Secondary | ICD-10-CM | POA: Diagnosis not present

## 2017-08-24 MED ORDER — FAMOTIDINE 20 MG PO TABS: 20.0000 mg | ORAL_TABLET | Freq: Every day | ORAL | 1 refills | Status: DC

## 2017-08-25 DIAGNOSIS — E669 Obesity, unspecified: Secondary | ICD-10-CM | POA: Diagnosis not present

## 2017-08-26 DIAGNOSIS — E669 Obesity, unspecified: Secondary | ICD-10-CM | POA: Diagnosis not present

## 2017-08-27 DIAGNOSIS — E669 Obesity, unspecified: Secondary | ICD-10-CM | POA: Diagnosis not present

## 2017-08-28 DIAGNOSIS — E669 Obesity, unspecified: Secondary | ICD-10-CM | POA: Diagnosis not present

## 2017-08-29 DIAGNOSIS — E669 Obesity, unspecified: Secondary | ICD-10-CM | POA: Diagnosis not present

## 2017-08-30 DIAGNOSIS — E669 Obesity, unspecified: Secondary | ICD-10-CM | POA: Diagnosis not present

## 2017-08-31 DIAGNOSIS — E669 Obesity, unspecified: Secondary | ICD-10-CM | POA: Diagnosis not present

## 2017-09-01 DIAGNOSIS — E669 Obesity, unspecified: Secondary | ICD-10-CM | POA: Diagnosis not present

## 2017-09-02 DIAGNOSIS — E669 Obesity, unspecified: Secondary | ICD-10-CM | POA: Diagnosis not present

## 2017-09-03 DIAGNOSIS — E669 Obesity, unspecified: Secondary | ICD-10-CM | POA: Diagnosis not present

## 2017-09-04 ENCOUNTER — Other Ambulatory Visit: Payer: Self-pay | Admitting: Internal Medicine

## 2017-09-04 DIAGNOSIS — E669 Obesity, unspecified: Secondary | ICD-10-CM | POA: Diagnosis not present

## 2017-09-06 DIAGNOSIS — E669 Obesity, unspecified: Secondary | ICD-10-CM | POA: Diagnosis not present

## 2017-09-07 DIAGNOSIS — E669 Obesity, unspecified: Secondary | ICD-10-CM | POA: Diagnosis not present

## 2017-09-08 DIAGNOSIS — E669 Obesity, unspecified: Secondary | ICD-10-CM | POA: Diagnosis not present

## 2017-09-09 DIAGNOSIS — E669 Obesity, unspecified: Secondary | ICD-10-CM | POA: Diagnosis not present

## 2017-09-10 DIAGNOSIS — E669 Obesity, unspecified: Secondary | ICD-10-CM | POA: Diagnosis not present

## 2017-09-11 DIAGNOSIS — E669 Obesity, unspecified: Secondary | ICD-10-CM | POA: Diagnosis not present

## 2017-09-12 DIAGNOSIS — E669 Obesity, unspecified: Secondary | ICD-10-CM | POA: Diagnosis not present

## 2017-09-13 DIAGNOSIS — E669 Obesity, unspecified: Secondary | ICD-10-CM | POA: Diagnosis not present

## 2017-09-14 DIAGNOSIS — E669 Obesity, unspecified: Secondary | ICD-10-CM | POA: Diagnosis not present

## 2017-09-15 DIAGNOSIS — E669 Obesity, unspecified: Secondary | ICD-10-CM | POA: Diagnosis not present

## 2017-09-16 DIAGNOSIS — E669 Obesity, unspecified: Secondary | ICD-10-CM | POA: Diagnosis not present

## 2017-09-17 DIAGNOSIS — E669 Obesity, unspecified: Secondary | ICD-10-CM | POA: Diagnosis not present

## 2017-09-18 DIAGNOSIS — E669 Obesity, unspecified: Secondary | ICD-10-CM | POA: Diagnosis not present

## 2017-09-19 DIAGNOSIS — E669 Obesity, unspecified: Secondary | ICD-10-CM | POA: Diagnosis not present

## 2017-09-20 DIAGNOSIS — J9610 Chronic respiratory failure, unspecified whether with hypoxia or hypercapnia: Secondary | ICD-10-CM | POA: Diagnosis not present

## 2017-09-20 DIAGNOSIS — E669 Obesity, unspecified: Secondary | ICD-10-CM | POA: Diagnosis not present

## 2017-09-21 DIAGNOSIS — E669 Obesity, unspecified: Secondary | ICD-10-CM | POA: Diagnosis not present

## 2017-09-22 DIAGNOSIS — E669 Obesity, unspecified: Secondary | ICD-10-CM | POA: Diagnosis not present

## 2017-09-23 DIAGNOSIS — E669 Obesity, unspecified: Secondary | ICD-10-CM | POA: Diagnosis not present

## 2017-09-24 DIAGNOSIS — E669 Obesity, unspecified: Secondary | ICD-10-CM | POA: Diagnosis not present

## 2017-09-25 DIAGNOSIS — E669 Obesity, unspecified: Secondary | ICD-10-CM | POA: Diagnosis not present

## 2017-09-26 DIAGNOSIS — E669 Obesity, unspecified: Secondary | ICD-10-CM | POA: Diagnosis not present

## 2017-09-28 DIAGNOSIS — E669 Obesity, unspecified: Secondary | ICD-10-CM | POA: Diagnosis not present

## 2017-09-29 DIAGNOSIS — E669 Obesity, unspecified: Secondary | ICD-10-CM | POA: Diagnosis not present

## 2017-09-30 DIAGNOSIS — E669 Obesity, unspecified: Secondary | ICD-10-CM | POA: Diagnosis not present

## 2017-10-01 DIAGNOSIS — E669 Obesity, unspecified: Secondary | ICD-10-CM | POA: Diagnosis not present

## 2017-10-02 DIAGNOSIS — E669 Obesity, unspecified: Secondary | ICD-10-CM | POA: Diagnosis not present

## 2017-10-03 DIAGNOSIS — E669 Obesity, unspecified: Secondary | ICD-10-CM | POA: Diagnosis not present

## 2017-10-04 DIAGNOSIS — E669 Obesity, unspecified: Secondary | ICD-10-CM | POA: Diagnosis not present

## 2017-10-05 DIAGNOSIS — E669 Obesity, unspecified: Secondary | ICD-10-CM | POA: Diagnosis not present

## 2017-10-06 DIAGNOSIS — E669 Obesity, unspecified: Secondary | ICD-10-CM | POA: Diagnosis not present

## 2017-10-07 DIAGNOSIS — E669 Obesity, unspecified: Secondary | ICD-10-CM | POA: Diagnosis not present

## 2017-10-08 DIAGNOSIS — E669 Obesity, unspecified: Secondary | ICD-10-CM | POA: Diagnosis not present

## 2017-10-10 ENCOUNTER — Telehealth: Payer: Self-pay | Admitting: Emergency Medicine

## 2017-10-10 NOTE — Telephone Encounter (Signed)
Called patient to schedule AWV. Patient will call back to reschedule.

## 2017-10-11 DIAGNOSIS — E669 Obesity, unspecified: Secondary | ICD-10-CM | POA: Diagnosis not present

## 2017-10-12 DIAGNOSIS — E669 Obesity, unspecified: Secondary | ICD-10-CM | POA: Diagnosis not present

## 2017-10-12 NOTE — Telephone Encounter (Signed)
°  Pt return call to schedule an AWV   [phone]

## 2017-10-13 ENCOUNTER — Telehealth: Payer: Self-pay | Admitting: Internal Medicine

## 2017-10-13 DIAGNOSIS — E669 Obesity, unspecified: Secondary | ICD-10-CM | POA: Diagnosis not present

## 2017-10-13 NOTE — Telephone Encounter (Signed)
Called to inform patient about Prolia injection. Insurance was verified and she has a $0 copay. Scheduled to see Dr Quay Burow on 12/06/17 and she would like to get it done at that time.

## 2017-10-14 DIAGNOSIS — E669 Obesity, unspecified: Secondary | ICD-10-CM | POA: Diagnosis not present

## 2017-10-15 DIAGNOSIS — E669 Obesity, unspecified: Secondary | ICD-10-CM | POA: Diagnosis not present

## 2017-10-16 DIAGNOSIS — E669 Obesity, unspecified: Secondary | ICD-10-CM | POA: Diagnosis not present

## 2017-10-17 DIAGNOSIS — E669 Obesity, unspecified: Secondary | ICD-10-CM | POA: Diagnosis not present

## 2017-10-18 DIAGNOSIS — E669 Obesity, unspecified: Secondary | ICD-10-CM | POA: Diagnosis not present

## 2017-10-19 DIAGNOSIS — E669 Obesity, unspecified: Secondary | ICD-10-CM | POA: Diagnosis not present

## 2017-10-20 DIAGNOSIS — E669 Obesity, unspecified: Secondary | ICD-10-CM | POA: Diagnosis not present

## 2017-10-21 DIAGNOSIS — E669 Obesity, unspecified: Secondary | ICD-10-CM | POA: Diagnosis not present

## 2017-10-21 DIAGNOSIS — J9610 Chronic respiratory failure, unspecified whether with hypoxia or hypercapnia: Secondary | ICD-10-CM | POA: Diagnosis not present

## 2017-10-22 DIAGNOSIS — E669 Obesity, unspecified: Secondary | ICD-10-CM | POA: Diagnosis not present

## 2017-10-23 DIAGNOSIS — E669 Obesity, unspecified: Secondary | ICD-10-CM | POA: Diagnosis not present

## 2017-10-24 DIAGNOSIS — E669 Obesity, unspecified: Secondary | ICD-10-CM | POA: Diagnosis not present

## 2017-10-25 DIAGNOSIS — E669 Obesity, unspecified: Secondary | ICD-10-CM | POA: Diagnosis not present

## 2017-10-26 DIAGNOSIS — E669 Obesity, unspecified: Secondary | ICD-10-CM | POA: Diagnosis not present

## 2017-10-27 DIAGNOSIS — E669 Obesity, unspecified: Secondary | ICD-10-CM | POA: Diagnosis not present

## 2017-10-28 DIAGNOSIS — E669 Obesity, unspecified: Secondary | ICD-10-CM | POA: Diagnosis not present

## 2017-10-29 DIAGNOSIS — E669 Obesity, unspecified: Secondary | ICD-10-CM | POA: Diagnosis not present

## 2017-10-30 ENCOUNTER — Other Ambulatory Visit: Payer: Self-pay | Admitting: Internal Medicine

## 2017-10-30 DIAGNOSIS — E669 Obesity, unspecified: Secondary | ICD-10-CM | POA: Diagnosis not present

## 2017-10-31 DIAGNOSIS — E669 Obesity, unspecified: Secondary | ICD-10-CM | POA: Diagnosis not present

## 2017-11-01 DIAGNOSIS — E669 Obesity, unspecified: Secondary | ICD-10-CM | POA: Diagnosis not present

## 2017-11-02 DIAGNOSIS — E669 Obesity, unspecified: Secondary | ICD-10-CM | POA: Diagnosis not present

## 2017-11-03 DIAGNOSIS — E669 Obesity, unspecified: Secondary | ICD-10-CM | POA: Diagnosis not present

## 2017-11-04 DIAGNOSIS — E669 Obesity, unspecified: Secondary | ICD-10-CM | POA: Diagnosis not present

## 2017-11-05 DIAGNOSIS — E669 Obesity, unspecified: Secondary | ICD-10-CM | POA: Diagnosis not present

## 2017-11-06 DIAGNOSIS — E669 Obesity, unspecified: Secondary | ICD-10-CM | POA: Diagnosis not present

## 2017-11-07 DIAGNOSIS — E669 Obesity, unspecified: Secondary | ICD-10-CM | POA: Diagnosis not present

## 2017-11-08 DIAGNOSIS — E669 Obesity, unspecified: Secondary | ICD-10-CM | POA: Diagnosis not present

## 2017-11-09 DIAGNOSIS — E669 Obesity, unspecified: Secondary | ICD-10-CM | POA: Diagnosis not present

## 2017-11-10 DIAGNOSIS — E669 Obesity, unspecified: Secondary | ICD-10-CM | POA: Diagnosis not present

## 2017-11-11 DIAGNOSIS — E669 Obesity, unspecified: Secondary | ICD-10-CM | POA: Diagnosis not present

## 2017-11-12 DIAGNOSIS — E669 Obesity, unspecified: Secondary | ICD-10-CM | POA: Diagnosis not present

## 2017-11-13 DIAGNOSIS — E669 Obesity, unspecified: Secondary | ICD-10-CM | POA: Diagnosis not present

## 2017-11-14 DIAGNOSIS — E669 Obesity, unspecified: Secondary | ICD-10-CM | POA: Diagnosis not present

## 2017-11-15 DIAGNOSIS — E669 Obesity, unspecified: Secondary | ICD-10-CM | POA: Diagnosis not present

## 2017-11-16 DIAGNOSIS — E669 Obesity, unspecified: Secondary | ICD-10-CM | POA: Diagnosis not present

## 2017-11-17 DIAGNOSIS — E669 Obesity, unspecified: Secondary | ICD-10-CM | POA: Diagnosis not present

## 2017-11-18 DIAGNOSIS — E669 Obesity, unspecified: Secondary | ICD-10-CM | POA: Diagnosis not present

## 2017-11-19 DIAGNOSIS — E669 Obesity, unspecified: Secondary | ICD-10-CM | POA: Diagnosis not present

## 2017-11-20 DIAGNOSIS — J9610 Chronic respiratory failure, unspecified whether with hypoxia or hypercapnia: Secondary | ICD-10-CM | POA: Diagnosis not present

## 2017-11-20 DIAGNOSIS — E669 Obesity, unspecified: Secondary | ICD-10-CM | POA: Diagnosis not present

## 2017-11-21 DIAGNOSIS — E669 Obesity, unspecified: Secondary | ICD-10-CM | POA: Diagnosis not present

## 2017-11-22 DIAGNOSIS — E669 Obesity, unspecified: Secondary | ICD-10-CM | POA: Diagnosis not present

## 2017-11-22 DIAGNOSIS — N189 Chronic kidney disease, unspecified: Secondary | ICD-10-CM | POA: Diagnosis not present

## 2017-11-22 DIAGNOSIS — Z6841 Body Mass Index (BMI) 40.0 and over, adult: Secondary | ICD-10-CM | POA: Diagnosis not present

## 2017-11-22 DIAGNOSIS — N179 Acute kidney failure, unspecified: Secondary | ICD-10-CM | POA: Diagnosis not present

## 2017-11-23 DIAGNOSIS — E669 Obesity, unspecified: Secondary | ICD-10-CM | POA: Diagnosis not present

## 2017-11-24 DIAGNOSIS — E669 Obesity, unspecified: Secondary | ICD-10-CM | POA: Diagnosis not present

## 2017-11-25 DIAGNOSIS — E669 Obesity, unspecified: Secondary | ICD-10-CM | POA: Diagnosis not present

## 2017-11-26 DIAGNOSIS — E669 Obesity, unspecified: Secondary | ICD-10-CM | POA: Diagnosis not present

## 2017-11-27 ENCOUNTER — Other Ambulatory Visit: Payer: Self-pay | Admitting: Internal Medicine

## 2017-11-27 DIAGNOSIS — E669 Obesity, unspecified: Secondary | ICD-10-CM | POA: Diagnosis not present

## 2017-11-28 DIAGNOSIS — E669 Obesity, unspecified: Secondary | ICD-10-CM | POA: Diagnosis not present

## 2017-11-29 DIAGNOSIS — E669 Obesity, unspecified: Secondary | ICD-10-CM | POA: Diagnosis not present

## 2017-11-30 DIAGNOSIS — E669 Obesity, unspecified: Secondary | ICD-10-CM | POA: Diagnosis not present

## 2017-12-01 DIAGNOSIS — E669 Obesity, unspecified: Secondary | ICD-10-CM | POA: Diagnosis not present

## 2017-12-02 DIAGNOSIS — E669 Obesity, unspecified: Secondary | ICD-10-CM | POA: Diagnosis not present

## 2017-12-03 DIAGNOSIS — E669 Obesity, unspecified: Secondary | ICD-10-CM | POA: Diagnosis not present

## 2017-12-04 ENCOUNTER — Telehealth: Payer: Self-pay | Admitting: Internal Medicine

## 2017-12-04 DIAGNOSIS — R7303 Prediabetes: Secondary | ICD-10-CM | POA: Insufficient documentation

## 2017-12-04 DIAGNOSIS — E669 Obesity, unspecified: Secondary | ICD-10-CM | POA: Diagnosis not present

## 2017-12-04 NOTE — Progress Notes (Signed)
Subjective:    Patient ID: Shawna Fuentes, female    DOB: 02/16/1933, 84 y.o.   MRN: [id number]  HPI The patient is here for follow up.  Overall, she feels well.  Her son is with her today.   Hypertension: She is taking her medication daily. She is compliant with a low sodium diet.  She denies chest pain, palpitations and regular headaches. She is exercising some - walking with walker during day minimally.  She does not monitor her blood pressure at home.    Leg edema:  She is taking lasix daily.  Her edema is well controlled.  She tries to walk during the day with her walker.    Hyperlipidemia: She is taking her medication daily. She is compliant with a low fat/cholesterol diet. She is exercising a little. She denies myalgias.   Prediabetes:  She is compliant with a low sugar/carbohydrate diet.  She is exercising some.  Hypothyroidism:  She is taking her medication daily.  She denies any recent changes in energy or weight that are unexplained.   CKD:  She is drinking 3 bottles of water a day.  She does not take any nsaids.   OP:  She is due for prolia and will get that today.  She is taking calcium/vitamin d daily.  Her last dexa was 3 years ago.     Medications and allergies reviewed with patient and updated if appropriate.  Patient Active Problem List   Diagnosis Date Noted  . Prediabetes 12/04/2017  . Cough 07/10/2016  . CKD (chronic kidney disease) stage 3, GFR 30-59 ml/min (HCC) 06/08/2016  . Urinary incontinence 05/13/2016  . Obstructive sleep apnea 09/15/2014  . Osteoporosis 07/15/2014  . Arthritis pain of hand 07/10/2014  . Anemia, iron deficiency 07/10/2014  . Deep vein thrombosis ([place]) 12/24/2013  . Chronic respiratory failure with hypercapnia ([place]) 12/21/2013  . Post-surgical hypothyroidism 12/21/2013  . Hypoxemia 12/20/2013  . Edema 12/12/2013  . Fracture, subtrochanteric, right femur, closed ([place]) 09/22/2013  . Chronic radicular low back pain 12/30/2012  .  Osteoarthritis of right knee 12/30/2012  . Hyperlipidemia   . Hypertension     Current Outpatient Medications on File Prior to Visit  Medication Sig Dispense Refill  . amLODipine (NORVASC) 2.5 MG tablet TAKE 1 TABLET BY MOUTH EVERY DAY 90 tablet 1  . aspirin 81 MG tablet Take 1 tablet (81 mg total) by mouth daily. 30 tablet   . atorvastatin (LIPITOR) 10 MG tablet TAKE 1 TABLET BY MOUTH EVERY EVENING 90 tablet 3  . calcitRIOL (ROCALTROL) 0.25 MCG capsule TAKE 1 CAPSULE (0.25 MCG TOTAL) BY MOUTH 2 (TWO) TIMES DAILY. 180 capsule 1  . calcium carbonate (OS-CAL) 600 MG TABS tablet Take 1 tablet (600 mg total) by mouth 2 (two) times daily with a meal. 180 tablet 3  . famotidine (PEPCID) 20 MG tablet Take 1 tablet (20 mg total) by mouth at bedtime. 90 tablet 1  . furosemide (LASIX) 20 MG tablet Take 1 tablet (20 mg total) by mouth daily. 10 tablet 0  . levothyroxine (SYNTHROID, LEVOTHROID) 100 MCG tablet Take 1 tablet (100 mcg total) by mouth daily. 90 tablet 1  . losartan (COZAAR) 100 MG tablet Take 1 tablet (100 mg total) by mouth daily. 90 tablet 3  . metoprolol tartrate (LOPRESSOR) 25 MG tablet TAKE 1/2 TABLET BY MOUTH TWICE A DAY 90 tablet 1  . nystatin (MYCOSTATIN/NYSTOP) 100000 UNIT/GM POWD Apply to affected area 3 times a day 60 g 0  .  traMADol (ULTRAM) 50 MG tablet Take 1 tablet (50 mg total) by mouth every 8 (eight) hours as needed. 30 tablet 0  . Vitamin D, Ergocalciferol, (DRISDOL) 50000 UNITS CAPS capsule Take 50,000 Units by mouth every 7 (seven) days.      No current facility-administered medications on file prior to visit.     Past Medical History:  Diagnosis Date  . Arthritis   . DVT (deep venous thrombosis) ([place]) 12/2013   bilateral upper extremity DVT found at time of thyroidectomy   . History of kidney stones   . Hyperlipidemia   . Hypertension   . Hypothyroidism (acquired) 12/2013   s/p thyroidectomy 12/21/13 at [facility]  . Osteoporosis 07/15/2014   DEXA @ [facility] 07/14/14: -3.1    . Thyroid goiter    s/p thyroidectomy - pathology demonstrated nodular hyperplasia    Past Surgical History:  Procedure Laterality Date  . FEMUR IM NAIL Right 09/22/2013   Procedure: INTRAMEDULLARY (IM) NAIL FEMORAL;  Surgeon: Wylene Simmer, MD;  Location: [facility];  Service: Orthopedics;  Laterality: Right;  . HIP SURGERY    . THYROIDECTOMY WITH STERNOTOMY  12/21/13   thyroid mass found incidentally on CT scan 12/2013; transferred to [facility] for surgical removal of thyroid and goiter requiring sternotomy    Social History   Socioeconomic History  . Marital status: Widowed    Spouse name: Not on file  . Number of children: Not on file  . Years of education: Not on file  . Highest education level: Not on file  Occupational History  . Occupation: retired  Scientific laboratory technician  . Financial resource strain: Not on file  . Food insecurity:    Worry: Not on file    Inability: Not on file  . Transportation needs:    Medical: Not on file    Non-medical: Not on file  Tobacco Use  . Smoking status: Never Smoker  . Smokeless tobacco: Never Used  . Tobacco comment: lives w/ dtr dorthea, 2nd dtr and 3 sons in town  Substance and Sexual Activity  . Alcohol use: No    Alcohol/week: 0.0 oz  . Drug use: No  . Sexual activity: Not on file  Lifestyle  . Physical activity:    Days per week: Not on file    Minutes per session: Not on file  . Stress: Not on file  Relationships  . Social connections:    Talks on phone: Not on file    Gets together: Not on file    Attends religious service: Not on file    Active member of club or organization: Not on file    Attends meetings of clubs or organizations: Not on file    Relationship status: Not on file  Other Topics Concern  . 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   Lives with daughter dorthea - has 3 sons and another daughter in town, supportive     Family History  Problem Relation Age of Onset  . Osteoarthritis Mother   . Hypertension Mother   . Hypertension Father   . Cancer Father        unknown type  . Diabetes Son   . Diabetes Son   . Clotting disorder Unknown   . Rheumatologic disease Unknown     Review of Systems  Constitutional: Negative for chills and fever.  Respiratory: Positive for shortness of breath (with exertion  sometimes). Negative for cough and wheezing.   Cardiovascular: Positive for leg swelling. Negative for chest pain and palpitations.  Neurological: Negative for dizziness, light-headedness and headaches.       Objective:   Vitals:   12/06/17 0749  BP: 130/86  Pulse: 80  Resp: 16  Temp: 98.5 F (36.9 C)  SpO2: 95%   BP Readings from Last 3 Encounters:  12/06/17 130/86  07/20/17 104/74  06/08/17 (!) 154/88   Wt Readings from Last 3 Encounters:  12/06/17 264 lb (119.7 kg)  07/20/17 258 lb 1.6 oz (117.1 kg)  06/08/17 256 lb (116.1 kg)   Body mass index is 46.77 kg/m.   Physical Exam    Constitutional: Appears well-developed and well-nourished. No distress.  HENT:  Head: Normocephalic and atraumatic.  Neck: Neck supple. No tracheal deviation present. No thyromegaly present.  No cervical lymphadenopathy Cardiovascular: Normal rate, regular rhythm and normal heart sounds.   No murmur heard. No carotid bruit .  No edema Pulmonary/Chest: Effort normal and breath sounds normal. No respiratory distress. No has no wheezes. No rales.  Skin: Skin is warm and dry. Not diaphoretic.  Psychiatric: Normal mood and affect. Behavior is normal.      Assessment & Plan:    See Problem List for Assessment and Plan of chronic medical problems.

## 2017-12-04 NOTE — Patient Instructions (Addendum)
  Test(s) ordered today. Your results will be released to [place] (or called to you) after review, usually within 72hours after test completion. If any changes need to be made, you will be notified at that same time.   P[place]olia injection administered today.   Medications reviewed and updated.  No changes recommended at this time.    Please followup in 6 months

## 2017-12-05 DIAGNOSIS — E669 Obesity, unspecified: Secondary | ICD-10-CM | POA: Diagnosis not present

## 2017-12-06 ENCOUNTER — Other Ambulatory Visit (INDEPENDENT_AMBULATORY_CARE_PROVIDER_SITE_OTHER): Payer: Medicare HMO

## 2017-12-06 ENCOUNTER — Ambulatory Visit: Payer: Medicare HMO | Admitting: Internal Medicine

## 2017-12-06 ENCOUNTER — Ambulatory Visit (INDEPENDENT_AMBULATORY_CARE_PROVIDER_SITE_OTHER): Payer: Medicare HMO | Admitting: Internal Medicine

## 2017-12-06 ENCOUNTER — Encounter: Payer: Self-pay | Admitting: Internal Medicine

## 2017-12-06 VITALS — BP 130/86 | HR 80 | Temp 98.5°F | Resp 16 | Wt 264.0 lb

## 2017-12-06 DIAGNOSIS — E890 Postprocedural hypothyroidism: Secondary | ICD-10-CM

## 2017-12-06 DIAGNOSIS — R600 Localized edema: Secondary | ICD-10-CM

## 2017-12-06 DIAGNOSIS — M810 Age-related osteoporosis without current pathological fracture: Secondary | ICD-10-CM

## 2017-12-06 DIAGNOSIS — N183 Chronic kidney disease, stage 3 unspecified: Secondary | ICD-10-CM

## 2017-12-06 DIAGNOSIS — E7849 Other hyperlipidemia: Secondary | ICD-10-CM | POA: Diagnosis not present

## 2017-12-06 DIAGNOSIS — R7303 Prediabetes: Secondary | ICD-10-CM | POA: Diagnosis not present

## 2017-12-06 DIAGNOSIS — I10 Essential (primary) hypertension: Secondary | ICD-10-CM

## 2017-12-06 LAB — CBC WITH DIFFERENTIAL/PLATELET
Basophils Absolute: 0 10*3/uL (ref 0.0–0.1)
Basophils Relative: 0.6 % (ref 0.0–3.0)
EOS PCT: 4 % (ref 0.0–5.0)
Eosinophils Absolute: 0.2 10*3/uL (ref 0.0–0.7)
HCT: 39 % (ref 36.0–46.0)
Hemoglobin: 12.8 g/dL (ref 12.0–15.0)
LYMPHS ABS: 1.9 10*3/uL (ref 0.7–4.0)
Lymphocytes Relative: 30.3 % (ref 12.0–46.0)
MCHC: 32.8 g/dL (ref 30.0–36.0)
MCV: 90.7 fl (ref 78.0–100.0)
MONOS PCT: 5.5 % (ref 3.0–12.0)
Monocytes Absolute: 0.3 10*3/uL (ref 0.1–1.0)
NEUTROS PCT: 59.6 % (ref 43.0–77.0)
Neutro Abs: 3.7 10*3/uL (ref 1.4–7.7)
PLATELETS: 184 10*3/uL (ref 150.0–400.0)
RBC: 4.3 Mil/uL (ref 3.87–5.11)
RDW: 14.3 % (ref 11.5–15.5)
WBC: 6.3 10*3/uL (ref 4.0–10.5)

## 2017-12-06 LAB — LIPID PANEL
CHOL/HDL RATIO: 3
Cholesterol: 151 mg/dL (ref 0–200)
HDL: 57.1 mg/dL (ref >39.00)
LDL Cholesterol: 85 mg/dL (ref 0–99)
NONHDL: 94.35
Triglycerides: 48 mg/dL (ref 0.0–149.0)
VLDL: 9.6 mg/dL (ref 0.0–40.0)

## 2017-12-06 LAB — COMPREHENSIVE METABOLIC PANEL
ALT: 12 U/L (ref 0–35)
AST: 13 U/L (ref 0–37)
Albumin: 4 g/dL (ref 3.5–5.2)
Alkaline Phosphatase: 93 U/L (ref 39–117)
BILIRUBIN TOTAL: 0.7 mg/dL (ref 0.2–1.2)
BUN: 27 mg/dL — ABNORMAL HIGH (ref 6–23)
CO2: 32 meq/L (ref 19–32)
Calcium: 9.9 mg/dL (ref 8.4–10.5)
Chloride: 101 mEq/L (ref 96–112)
Creatinine, Ser: 1.16 mg/dL (ref 0.40–1.20)
GFR: 57.38 mL/min — AB (ref >60.00)
GLUCOSE: 102 mg/dL — AB (ref 70–99)
POTASSIUM: 3.9 meq/L (ref 3.5–5.1)
Sodium: 143 mEq/L (ref 135–145)
Total Protein: 7.6 g/dL (ref 6.0–8.3)

## 2017-12-06 LAB — HEMOGLOBIN A1C: Hgb A1c MFr Bld: 5.9 % (ref 4.6–6.5)

## 2017-12-06 LAB — VITAMIN D 25 HYDROXY (VIT D DEFICIENCY, FRACTURES): VITD: 37.39 ng/mL (ref 30.00–100.00)

## 2017-12-06 LAB — TSH: TSH: 1.26 u[IU]/mL (ref 0.35–4.50)

## 2017-12-06 MED ORDER — DENOSUMAB 60 MG/ML ~~LOC~~ SOSY
60.0000 mg | PREFILLED_SYRINGE | Freq: Once | SUBCUTANEOUS | Status: AC
  Administered 2017-12-06: 60 mg via SUBCUTANEOUS

## 2017-12-06 NOTE — Assessment & Plan Note (Signed)
Lab Results  Component Value Date   HGBA1C 5.7 06/08/2017   Check a1c Low sugar / carb diet Stressed regular exercise

## 2017-12-06 NOTE — Assessment & Plan Note (Signed)
Check tsh  Titrate med dose if needed  

## 2017-12-06 NOTE — Assessment & Plan Note (Signed)
prolia given today Exercising some - encouraged to walk as much as possible with walker dexa due - ordered Taking cal/vitamin d Will check vitamin d level

## 2017-12-06 NOTE — Assessment & Plan Note (Signed)
cmp Continue water throughout day

## 2017-12-06 NOTE — Assessment & Plan Note (Signed)
Check lipid panel  Continue daily statin Regular exercise and healthy diet encouraged  

## 2017-12-06 NOTE — Assessment & Plan Note (Signed)
BP well controlled Current regimen effective and well tolerated Continue current medications at current doses cmp  

## 2017-12-06 NOTE — Assessment & Plan Note (Signed)
Chronic Controlled, stable Continue current dose of medication  

## 2017-12-07 DIAGNOSIS — E669 Obesity, unspecified: Secondary | ICD-10-CM | POA: Diagnosis not present

## 2017-12-08 DIAGNOSIS — E669 Obesity, unspecified: Secondary | ICD-10-CM | POA: Diagnosis not present

## 2017-12-09 DIAGNOSIS — E669 Obesity, unspecified: Secondary | ICD-10-CM | POA: Diagnosis not present

## 2017-12-10 DIAGNOSIS — E669 Obesity, unspecified: Secondary | ICD-10-CM | POA: Diagnosis not present

## 2017-12-11 DIAGNOSIS — E669 Obesity, unspecified: Secondary | ICD-10-CM | POA: Diagnosis not present

## 2017-12-12 DIAGNOSIS — E669 Obesity, unspecified: Secondary | ICD-10-CM | POA: Diagnosis not present

## 2017-12-12 MED ORDER — LEVOTHYROXINE SODIUM 100 MCG PO TABS: 100.0000 ug | ORAL_TABLET | Freq: Every day | ORAL | 1 refills | Status: DC

## 2017-12-12 NOTE — Telephone Encounter (Signed)
Nor refilled at appt. Is refill appropriate?  Please advise

## 2017-12-12 NOTE — Addendum Note (Signed)
Addended by: Terence Lux B on: 12/12/2017 09:41 AM   Modules accepted: Orders

## 2017-12-12 NOTE — Telephone Encounter (Signed)
Pt is completely out. She requested this medication on 12/04/17.

## 2017-12-12 NOTE — Telephone Encounter (Signed)
Patient is calling to follow up on this medication. Please advise.   [id number] - , [place] [address]  Phone: [phone] Fax: [phone]

## 2017-12-13 DIAGNOSIS — E669 Obesity, unspecified: Secondary | ICD-10-CM | POA: Diagnosis not present

## 2017-12-14 DIAGNOSIS — E669 Obesity, unspecified: Secondary | ICD-10-CM | POA: Diagnosis not present

## 2017-12-15 DIAGNOSIS — E669 Obesity, unspecified: Secondary | ICD-10-CM | POA: Diagnosis not present

## 2017-12-16 DIAGNOSIS — E669 Obesity, unspecified: Secondary | ICD-10-CM | POA: Diagnosis not present

## 2017-12-17 DIAGNOSIS — E669 Obesity, unspecified: Secondary | ICD-10-CM | POA: Diagnosis not present

## 2017-12-18 DIAGNOSIS — E669 Obesity, unspecified: Secondary | ICD-10-CM | POA: Diagnosis not present

## 2017-12-19 DIAGNOSIS — E669 Obesity, unspecified: Secondary | ICD-10-CM | POA: Diagnosis not present

## 2017-12-20 DIAGNOSIS — E669 Obesity, unspecified: Secondary | ICD-10-CM | POA: Diagnosis not present

## 2017-12-21 DIAGNOSIS — J9610 Chronic respiratory failure, unspecified whether with hypoxia or hypercapnia: Secondary | ICD-10-CM | POA: Diagnosis not present

## 2017-12-21 DIAGNOSIS — E669 Obesity, unspecified: Secondary | ICD-10-CM | POA: Diagnosis not present

## 2017-12-22 DIAGNOSIS — E669 Obesity, unspecified: Secondary | ICD-10-CM | POA: Diagnosis not present

## 2017-12-23 DIAGNOSIS — E669 Obesity, unspecified: Secondary | ICD-10-CM | POA: Diagnosis not present

## 2017-12-24 DIAGNOSIS — E669 Obesity, unspecified: Secondary | ICD-10-CM | POA: Diagnosis not present

## 2017-12-25 DIAGNOSIS — E669 Obesity, unspecified: Secondary | ICD-10-CM | POA: Diagnosis not present

## 2017-12-26 DIAGNOSIS — E669 Obesity, unspecified: Secondary | ICD-10-CM | POA: Diagnosis not present

## 2017-12-27 DIAGNOSIS — E669 Obesity, unspecified: Secondary | ICD-10-CM | POA: Diagnosis not present

## 2017-12-28 DIAGNOSIS — E669 Obesity, unspecified: Secondary | ICD-10-CM | POA: Diagnosis not present

## 2017-12-29 DIAGNOSIS — E669 Obesity, unspecified: Secondary | ICD-10-CM | POA: Diagnosis not present

## 2017-12-30 DIAGNOSIS — E669 Obesity, unspecified: Secondary | ICD-10-CM | POA: Diagnosis not present

## 2017-12-31 DIAGNOSIS — E669 Obesity, unspecified: Secondary | ICD-10-CM | POA: Diagnosis not present

## 2018-01-01 DIAGNOSIS — E669 Obesity, unspecified: Secondary | ICD-10-CM | POA: Diagnosis not present

## 2018-01-02 DIAGNOSIS — E669 Obesity, unspecified: Secondary | ICD-10-CM | POA: Diagnosis not present

## 2018-01-03 DIAGNOSIS — E669 Obesity, unspecified: Secondary | ICD-10-CM | POA: Diagnosis not present

## 2018-01-04 DIAGNOSIS — E669 Obesity, unspecified: Secondary | ICD-10-CM | POA: Diagnosis not present

## 2018-01-06 DIAGNOSIS — E669 Obesity, unspecified: Secondary | ICD-10-CM | POA: Diagnosis not present

## 2018-01-07 ENCOUNTER — Other Ambulatory Visit: Payer: Self-pay | Admitting: Internal Medicine

## 2018-01-07 DIAGNOSIS — E669 Obesity, unspecified: Secondary | ICD-10-CM | POA: Diagnosis not present

## 2018-01-08 DIAGNOSIS — E669 Obesity, unspecified: Secondary | ICD-10-CM | POA: Diagnosis not present

## 2018-01-09 ENCOUNTER — Encounter: Payer: Self-pay | Admitting: Internal Medicine

## 2018-01-09 ENCOUNTER — Ambulatory Visit (INDEPENDENT_AMBULATORY_CARE_PROVIDER_SITE_OTHER): Payer: Medicare HMO | Admitting: Internal Medicine

## 2018-01-09 VITALS — BP 156/88 | HR 96 | Temp 98.6°F | Resp 18 | Wt 264.0 lb

## 2018-01-09 DIAGNOSIS — E669 Obesity, unspecified: Secondary | ICD-10-CM | POA: Diagnosis not present

## 2018-01-09 DIAGNOSIS — R600 Localized edema: Secondary | ICD-10-CM | POA: Diagnosis not present

## 2018-01-09 DIAGNOSIS — I10 Essential (primary) hypertension: Secondary | ICD-10-CM | POA: Diagnosis not present

## 2018-01-09 NOTE — Assessment & Plan Note (Signed)
Bilateral lower extremity edema-acute on chronic.  Worsening of edema for the past 3 days-possibly related to heat and increased salt intake Elevate legs Stressed low-sodium diet Increase Lasix to 40 mg for 2-3 days only-advised that if after 2 days the swelling is back to baseline then returned to the 20 mg daily Call if swelling does not improve

## 2018-01-09 NOTE — Assessment & Plan Note (Signed)
Blood pressure elevated here today, but she did not take her blood pressure medication this morning She will take it when she goes home Last month her blood pressure was well controlled so no change in medications at this time

## 2018-01-09 NOTE — Progress Notes (Signed)
Subjective:    Patient ID: Shawna Fuentes, female    DOB: June 12, 1933, 84 y.o.   MRN: [id number]  HPI The patient is here for an acute visit.  She is here with 2 daughters.  She states she did not take her blood pressure medication this morning.  Leg edema: She is chronic leg edema and takes Lasix 20 mg daily.  3 days ago her leg swelling got worse and she is unsure why.  It has continued to get worse since it started.  It is now at the point that it makes it difficult for her to walk around.  She denies any chest pain or shortness of breath.  She denies any changes in her medications.  She has eaten some popcorn and that may have had some salt in it.  Friday or 3 days ago she also ate [place] food.  She denies any changes in activity.     Medications and allergies reviewed with patient and updated if appropriate.  Patient Active Problem List   Diagnosis Date Noted  . Prediabetes 12/04/2017  . Cough 07/10/2016  . CKD (chronic kidney disease) stage 3, GFR 30-59 ml/min (HCC) 06/08/2016  . Urinary incontinence 05/13/2016  . Obstructive sleep apnea 09/15/2014  . Osteoporosis 07/15/2014  . Arthritis pain of hand 07/10/2014  . Anemia, iron deficiency 07/10/2014  . Deep vein thrombosis ([place]) 12/24/2013  . Chronic respiratory failure with hypercapnia ([place]) 12/21/2013  . Post-surgical hypothyroidism 12/21/2013  . Hypoxemia 12/20/2013  . Edema 12/12/2013  . Fracture, subtrochanteric, right femur, closed ([place]) 09/22/2013  . Chronic radicular low back pain 12/30/2012  . Osteoarthritis of right knee 12/30/2012  . Hyperlipidemia   . Hypertension     Current Outpatient Medications on File Prior to Visit  Medication Sig Dispense Refill  . amLODipine (NORVASC) 2.5 MG tablet TAKE 1 TABLET BY MOUTH EVERY DAY 90 tablet 1  . aspirin 81 MG tablet Take 1 tablet (81 mg total) by mouth daily. 30 tablet   . atorvastatin (LIPITOR) 10 MG tablet TAKE 1 TABLET BY MOUTH EVERY EVENING 90 tablet 3  .  calcitRIOL (ROCALTROL) 0.25 MCG capsule TAKE 1 CAPSULE (0.25 MCG TOTAL) BY MOUTH 2 (TWO) TIMES DAILY. 180 capsule 1  . calcium carbonate (OS-CAL) 600 MG TABS tablet Take 1 tablet (600 mg total) by mouth 2 (two) times daily with a meal. 180 tablet 3  . famotidine (PEPCID) 20 MG tablet Take 1 tablet (20 mg total) by mouth at bedtime. 90 tablet 1  . furosemide (LASIX) 20 MG tablet Take 1 tablet (20 mg total) by mouth daily. 10 tablet 0  . levothyroxine (SYNTHROID, LEVOTHROID) 100 MCG tablet Take 1 tablet (100 mcg total) by mouth daily. 90 tablet 1  . losartan (COZAAR) 100 MG tablet Take 1 tablet (100 mg total) by mouth daily. 90 tablet 3  . metoprolol tartrate (LOPRESSOR) 25 MG tablet TAKE 1/2 TABLET BY MOUTH TWICE A DAY 90 tablet 1  . nystatin (MYCOSTATIN/NYSTOP) 100000 UNIT/GM POWD Apply to affected area 3 times a day 60 g 0  . traMADol (ULTRAM) 50 MG tablet Take 1 tablet (50 mg total) by mouth every 8 (eight) hours as needed. 30 tablet 0  . Vitamin D, Ergocalciferol, (DRISDOL) 50000 UNITS CAPS capsule Take 50,000 Units by mouth every 7 (seven) days.      No current facility-administered medications on file prior to visit.     Past Medical History:  Diagnosis Date  . Arthritis   . DVT (  deep venous thrombosis) ([place]) 12/2013   bilateral upper extremity DVT found at time of thyroidectomy   . History of kidney stones   . Hyperlipidemia   . Hypertension   . Hypothyroidism (acquired) 12/2013   s/p thy[facility]ctomy 12/21/13 at [facility]  . Osteoporosis 07/15/2014   DEXA @ [facility] 07/14/14: -3.1  . Thyroid goiter    s/p thyroidectomy - pathology demonstrated nodular hyperplasia    Past Surgical History:  Procedure Laterality Date  . FEMUR IM NAIL Right 09/22/2013   Procedure: INTRAMEDULLARY (IM) NAIL FEMORAL;  Surgeon: Wylene Simmer, MD;  Location: [place];  Service: Orthopedics;  Laterality: Right;  . HIP SURGERY    . THYROIDECTOMY WITH STERNOTOMY  12/21/13   thyroid mass found incidentally on CT scan 12/2013;  transferred to [facility] for surgical removal of thyroid and goiter requiring sternotomy    Social History   Socioeconomic History  . Marital status: Widowed    Spouse name: Not on file  . Number of children: Not on file  . Years of education: Not on file  . Highest education level: Not on file  Occupational History  . Occupation: retired  Scientific laboratory technician  . Financial resource strain: Not on file  . Food insecurity:    Worry: Not on file    Inability: Not on file  . Transportation needs:    Medical: Not on file    Non-medical: Not on file  Tobacco Use  . Smoking status: Never Smoker  . Smokeless tobacco: Never Used  . Tobacco comment: lives w/ dtr dorthea, 2nd dtr and 3 sons in town  Substance and Sexual Activity  . Alcohol use: No    Alcohol/week: 0.0 oz  . Drug use: No  . Sexual activity: Not on file  Lifestyle  . Physical activity:    Days per week: Not on file    Minutes per session: Not on file  . Stress: Not on file  Relationships  . Social connections:    Talks on phone: Not on file    Gets together: Not on file    Attends religious service: Not on file    Active member of club or organization: Not on file    Attends meetings of clubs or organizations: Not on file    Relationship status: Not on file  Other Topics Concern  . 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   Lives with daughter dorthea - has 3 sons and another daughter in town, supportive    Family History  Problem Relation Age of Onset  . Osteoarthritis Mother   . Hypertension Mother   . Hypertension Father   . Cancer Father        unknown type  . Diabetes Son   . Diabetes Son   . Clotting disorder Unknown   . Rheumatologic disease Unknown     Review of Systems  Constitutional: Negative for chills and fever.  Respiratory: Negative for cough, shortness of breath and wheezing.   Cardiovascular:  Positive for leg swelling. Negative for chest pain and palpitations.  Skin: Negative for color change.  Neurological: Negative for light-headedness and headaches.       Objective:   Vitals:   01/09/18 1011  BP: (!) 156/88  Pulse: 96  Resp: 18  Temp: 98.6 F (37 C)  SpO2: 94%   BP Readings from Last 3 Encounters:  01/09/18 (!) 156/88  12/06/17 130/86  07/20/17 104/74   Wt Readings from Last 3 Encounters:  01/09/18 264 lb (119.7 kg)  12/06/17 264 lb (119.7 kg)  07/20/17 258 lb 1.6 oz (117.1 kg)   Body mass index is 46.77 kg/m.   Physical Exam  Constitutional: She appears well-developed and well-nourished. No distress.  HENT:  Head: Normocephalic and atraumatic.  Neck: Normal range of motion. No JVD present. No tracheal deviation present. No thyromegaly present.  Cardiovascular: Normal rate and regular rhythm.  Pulmonary/Chest: Effort normal and breath sounds normal. No respiratory distress. She has no wheezes. She has no rales.  Musculoskeletal: She exhibits edema (1+ mildly pitting bilateral lower extremity edema right> left).  Lymphadenopathy:    She has no cervical adenopathy.  Skin: Skin is warm and dry. She is not diaphoretic. No erythema.           Assessment & Plan:    See Problem List for Assessment and Plan of chronic medical problems.

## 2018-01-09 NOTE — Patient Instructions (Addendum)
Increase the lasix to 40 mg for 2-3 days and then decrease it back to your 20 mg daily.    If your swelling does not improve please call.

## 2018-01-10 DIAGNOSIS — E669 Obesity, unspecified: Secondary | ICD-10-CM | POA: Diagnosis not present

## 2018-01-11 DIAGNOSIS — E669 Obesity, unspecified: Secondary | ICD-10-CM | POA: Diagnosis not present

## 2018-01-12 DIAGNOSIS — E669 Obesity, unspecified: Secondary | ICD-10-CM | POA: Diagnosis not present

## 2018-01-13 DIAGNOSIS — E669 Obesity, unspecified: Secondary | ICD-10-CM | POA: Diagnosis not present

## 2018-01-14 DIAGNOSIS — E669 Obesity, unspecified: Secondary | ICD-10-CM | POA: Diagnosis not present

## 2018-01-15 DIAGNOSIS — E669 Obesity, unspecified: Secondary | ICD-10-CM | POA: Diagnosis not present

## 2018-01-16 DIAGNOSIS — E669 Obesity, unspecified: Secondary | ICD-10-CM | POA: Diagnosis not present

## 2018-01-16 NOTE — Progress Notes (Addendum)
Subjective:   Shawna Fuentes is a 84 y.o. female who presents for Medicare Annual (Subsequent) preventive examination.  Review of Systems:  No ROS.  Medicare Wellness Visit. Additional risk factors are reflected in the social history.  Cardiac Risk Factors include: advanced age (>99men, >87 women);dyslipidemia;hypertension;obesity (BMI >30kg/m2);sedentary lifestyle Sleep patterns: feels rested on waking, gets up 1-2 times nightly to void and sleeps 8-9 hours nightly.    Home Safety/Smoke Alarms: Feels safe in home. Smoke alarms in place.  Living environment; residence and Firearm Safety: 1-story house/ trailer, equipment: Walkers, Type: rollator, Hydrologist, Type: Tub Seat/Chair and BSC, no firearms. , Lives with daughter, no needs for DME, good support system, has an aide who comes daily to assist with ADLs Seat Belt Safety/Bike Helmet: Wears seat belt.      Objective:     Vitals: BP 134/72   Pulse 62   Resp 18   Ht 5\' 3"  (1.6 m)   Wt 264 lb (119.7 kg)   SpO2 94%   BMI 46.77 kg/m   Body mass index is 46.77 kg/m.  Advanced Directives 01/17/2018 01/21/2015 12/20/2013 09/22/2013  Does Patient Have a Medical Advance Directive? No No Patient does not have advance directive;Patient would not like information Patient does not have advance directive;Patient would not like information  Would patient like information on creating a medical advance directive? No - Patient declined Yes - Scientist, clinical (histocompatibility and immunogenetics) given - -  Pre-existing out of facility DNR order (yellow form or pink MOST form) - - No No    Tobacco Social History   Tobacco Use  Smoking Status Never Smoker  Smokeless Tobacco Never Used  Tobacco Comment   lives w/ dtr dorthea, 2nd dtr and 3 sons in town     Counseling given: Not Answered Comment: lives w/ dtr dorthea, 2nd dtr and 3 sons in town  Past Medical History:  Diagnosis Date  . Arthritis   . DVT (deep venous thrombosis) ([place]) 12/2013   bilateral upper  extremity DVT found at time of thyroidectomy   . History of kidney stones   . Hyperlipidemia   . Hypertension   . Hypothyroidism (acquired) 12/2013   s/p thyroidectomy 12/21/13 at [facility]  . Osteoporosis 07/15/2014   DEXA @ [facility] 07/14/14: -3.1  . Thyroid goiter    s/p thyroidectomy - pathology demonstrated nodular hyperplasia   Past Surgical History:  Procedure Laterality Date  . FEMUR IM NAIL Right 09/22/2013   Procedure: INTRAMEDULLARY (IM) NAIL FEMORAL;  Surgeon: Wylene Simmer, MD;  Location: [place];  Service: Orthopedics;  Laterality: Right;  . HIP SURGERY    . THYROIDECTOMY WITH STERNOTOMY  12/21/13   thyroid mass found incidentally on CT scan 12/2013; transferred to [facility] for surgical removal of thyroid and goiter requiring sternotomy   Family History  Problem Relation Age of Onset  . Osteoarthritis Mother   . Hypertension Mother   . Hypertension Father   . Cancer Father        unknown type  . Diabetes Son   . Diabetes Son   . Clotting disorder Unknown   . Rheumatologic disease Unknown    Social History   Socioeconomic History  . Marital status: Widowed    Spouse name: Not on file  . Number of children: 5  . Years of education: Not on file  . Highest education level: Not on file  Occupational History  . Occupation: retired  Scientific laboratory technician  . Financial resource strain: Not hard at all  .  Food insecurity:    Worry: Never true    Inability: Never true  . Transportation needs:    Medical: No    Non-medical: No  Tobacco Use  . Smoking status: Never Smoker  . Smokeless tobacco: Never Used  . Tobacco comment: lives w/ dtr dorthea, 2nd dtr and 3 sons in town  Substance and Sexual Activity  . Alcohol use: No    Alcohol/week: 0.0 oz  . Drug use: No  . Sexual activity: Not Currently  Lifestyle  . Physical activity:    Days per week: 0 days    Minutes per session: 0 min  . Stress: Not at all  Relationships  . Social connections:    Talks on phone: More than three times a  week    Gets together: More than three times a week    Attends religious service: More than 4 times per year    Active member of club or organization: Yes    Attends meetings of clubs or organizations: More than 4 times per year    Relationship status: Widowed  Other Topics Concern  . 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   Lives with daughter Barbara Cower - has 3 sons and another daughter in town, supportive    Outpatient Encounter Medications as of 01/17/2018  Medication Sig  . amLODipine (NORVASC) 2.5 MG tablet TAKE 1 TABLET BY MOUTH EVERY DAY  . aspirin 81 MG tablet Take 1 tablet (81 mg total) by mouth daily.  Marland Kitchen atorvastatin (LIPITOR) 10 MG tablet TAKE 1 TABLET BY MOUTH EVERY EVENING  . calcitRIOL (ROCALTROL) 0.25 MCG capsule TAKE 1 CAPSULE (0.25 MCG TOTAL) BY MOUTH 2 (TWO) TIMES DAILY.  . calcium carbonate (OS-CAL) 600 MG TABS tablet Take 1 tablet (600 mg total) by mouth 2 (two) times daily with a meal.  . famotidine (PEPCID) 20 MG tablet Take 1 tablet (20 mg total) by mouth at bedtime.  . furosemide (LASIX) 20 MG tablet Take 1 tablet (20 mg total) by mouth daily.  Marland Kitchen levothyroxine (SYNTHROID, LEVOTHROID) 100 MCG tablet Take 1 tablet (100 mcg total) by mouth daily.  Marland Kitchen losartan (COZAAR) 100 MG tablet Take 1 tablet (100 mg total) by mouth daily.  . metoprolol tartrate (LOPRESSOR) 25 MG tablet TAKE 1/2 TABLET BY MOUTH TWICE A DAY  . nystatin (MYCOSTATIN/NYSTOP) 100000 UNIT/GM POWD Apply to affected area 3 times a day  . Vitamin D, Ergocalciferol, (DRISDOL) 50000 UNITS CAPS capsule Take 50,000 Units by mouth every 7 (seven) days.   . [DISCONTINUED] traMADol (ULTRAM) 50 MG tablet Take 1 tablet (50 mg total) by mouth every 8 (eight) hours as needed. (Patient not taking: Reported on 01/17/2018)   No facility-administered encounter medications on file as of 01/17/2018.     Activities of Daily  Living In your present state of health, do you have any difficulty performing the following activities: 01/17/2018  Hearing? N  Vision? N  Difficulty concentrating or making decisions? Y  Walking or climbing stairs? Y  Dressing or bathing? Y  Doing errands, shopping? Y  Preparing Food and eating ? Y  Using the Toilet? N  In the past six months, have you accidently leaked urine? N  Do you have problems with loss of bowel control? N  Managing your Medications? Y  Managing your Finances? Y  Housekeeping or managing your Housekeeping? Y  Some recent data might be hidden  Patient Care Team: Binnie Rail, MD as PCP - General (Internal Medicine) Wylene Simmer, MD (Orthopedic Surgery)    Assessment:   This is a routine wellness examination for [place]. Physical assessment deferred to PCP.   Exercise Activities and Dietary recommendations Current Exercise Habits: Home exercise routine, Type of exercise: stretching;walking(chair exercises), Time (Minutes): 25, Frequency (Times/Week): 4, Weekly Exercise (Minutes/Week): 100, Intensity: Mild, Exercise limited by: orthopedic condition(s);cardiac condition(s) Diet (meal preparation, eat out, water intake, caffeinated beverages, dairy products, fruits and vegetables): in general, a "healthy" diet  , well balanced   Reviewed heart healthy diet, Encouraged patient to maintain daily water and fluid intake.  Goals      Patient Stated   . come off of cpap  (pt-stated)     States she hopes get off oxygen; working to come off;   Also wants to go to church and will discuss portable tank so she can go to church      Other   . Patient Stated     I will continue to do as much for myself as possible by staying active, doing chair exercises, and eating healthy. Enjoy life, family, church and friends.       Fall Risk Fall Risk  01/17/2018 12/06/2016 01/21/2015 12/29/2012  Falls in the past year? No No No Yes  Risk for fall due to : Impaired  balance/gait;Impaired mobility - Impaired balance/gait;Impaired mobility Impaired balance/gait;Impaired mobility    Depression Screen PHQ 2/9 Scores 01/17/2018 12/06/2016 01/21/2015 12/29/2012  PHQ - 2 Score 0 0 0 0  PHQ- 9 Score 1 - - -     Cognitive Function MMSE - Mini Mental State Exam 01/17/2018 01/21/2015  Not completed: - Unable to complete  Orientation to time 5 -  Orientation to Place 5 -  Registration 3 -  Attention/ Calculation 3 -  Recall 0 -  Language- name 2 objects 2 -  Language- repeat 1 -  Language- follow 3 step command 3 -  Language- read & follow direction 1 -  Write a sentence 1 -  Copy design 1 -  Total score 25 -        Immunization History  Administered Date(s) Administered  . Influenza, High Dose Seasonal PF 04/20/2013, 04/09/2014, 06/04/2015, 05/21/2016, 06/08/2017  . Pneumococcal Conjugate-13 07/10/2014  . Pneumococcal-Unspecified 07/05/2006   Screening Tests Health Maintenance  Topic Date Due  . DEXA SCAN  07/09/2016  . INFLUENZA VACCINE  02/02/2018  . PNA vac Low Risk Adult  Completed      Plan:     I have personally reviewed and noted the following in the patient's chart:   . Medical and social history . Use of alcohol, tobacco or illicit drugs  . Current medications and supplements . Functional ability and status . Nutritional status . Physical activity . Advanced directives . List of other physicians . Vitals . Screenings to include cognitive, depression, and falls . Referrals and appointments  In addition, I have reviewed and discussed with patient certain preventive protocols, quality metrics, and best practice recommendations. A written personalized care plan for preventive services as well as general preventive health recommendations were provided to patient.     Michiel Cowboy, RN  01/17/2018   Medical screening examination/treatment/procedure(s) were performed by non-physician practitioner and as supervising physician I was  immediately available for consultation/collaboration. I agree with above. Binnie Rail, MD

## 2018-01-17 ENCOUNTER — Ambulatory Visit (INDEPENDENT_AMBULATORY_CARE_PROVIDER_SITE_OTHER): Payer: Medicare HMO | Admitting: *Deleted

## 2018-01-17 VITALS — BP 134/72 | HR 62 | Resp 18 | Ht 63.0 in | Wt 264.0 lb

## 2018-01-17 DIAGNOSIS — Z0000 Encounter for general adult medical examination without abnormal findings: Secondary | ICD-10-CM | POA: Diagnosis not present

## 2018-01-17 DIAGNOSIS — E669 Obesity, unspecified: Secondary | ICD-10-CM | POA: Diagnosis not present

## 2018-01-17 NOTE — Patient Instructions (Addendum)
[facility] in [place], [place] in: [place] Address: [address], [place], [place] [address] Phone: [phone]  [facility] [place] [facility]ologist in [place], [place] Address: [address], [place], [place] [address] Phone: [phone]  Continue doing brain stimulating activities (puzzles, reading, adult coloring books, staying active) to keep memory sharp.   Continue to eat heart healthy diet (full of fruits, vegetables, whole grains, lean protein, water--limit salt, fat, and sugar intake) and increase physical activity as tolerated.   Shawna Fuentes , Thank you for taking time to come for your Medicare Wellness Visit. I appreciate your ongoing commitment to your health goals. Please review the following plan we discussed and let me know if I can assist you in the future.   These are the goals we discussed: Goals      Patient Stated   . come off of cpap  (pt-stated)     States she hopes get off oxygen; working to come off;   Also wants to go to church and will discuss portable tank so she can go to church      Other   . Patient Stated     I will continue to do as much for myself as possible by staying active, doing chair exercises, and eating healthy. Enjoy life, family, church and friends.       This is a list of the screening recommended for you and due dates:  Health Maintenance  Topic Date Due  . DEXA scan (bone density measurement)  07/09/2016  . Flu Shot  02/02/2018  . Pneumonia vaccines  Completed   Health Maintenance, Female Adopting a healthy lifestyle and getting preventive care can go a long way to promote health and wellness. Talk with your health care provider about what schedule of regular examinations is right for you. This is a good chance for you to check in with your provider about disease prevention and staying healthy. In between checkups, there are plenty of things  you can do on your own. Experts have done a lot of research about which lifestyle changes and preventive measures are most likely to keep you healthy. Ask your health care provider for more information. Weight and diet Eat a healthy diet  Be sure to include plenty of vegetables, fruits, low-fat dairy products, and lean protein.  Do not eat a lot of foods high in solid fats, added sugars, or salt.  Get regular exercise. This is one of the most important things you can do for your health. ? Most adults should exercise for at least 150 minutes each week. The exercise should increase your heart rate and make you sweat (moderate-intensity exercise). ? Most adults should also do strengthening exercises at least twice a week. This is in addition to the moderate-intensity exercise.  Maintain a healthy weight  Body mass index (BMI) is a measurement that can be used to identify possible weight problems. It estimates body fat based on height and weight. Your health care provider can help determine your BMI and help you achieve or maintain a healthy weight.  For females 50 years of age and older: ? A BMI below 18.5 is considered underweight. ? A BMI of 18.5 to 24.9 is normal. ? A BMI of 25 to 29.9 is considered overweight. ? A BMI of 30 and above is considered obese.  Watch levels of cholesterol and blood lipids  You should start having your blood tested for lipids  and cholesterol at 82 years of age, then have this test every 5 years.  You may need to have your cholesterol levels checked more often if: ? Your lipid or cholesterol levels are high. ? You are older than 82 years of age. ? You are at high risk for heart disease.  Cancer screening Lung Cancer  Lung cancer screening is recommended for adults 19-86 years old who are at high risk for lung cancer because of a history of smoking.  A yearly low-dose CT scan of the lungs is recommended for people who: ? Currently smoke. ? Have quit  within the past 15 years. ? Have at least a 30-pack-year history of smoking. A pack year is smoking an average of one pack of cigarettes a day for 1 year.  Yearly screening should continue until it has been 15 years since you quit.  Yearly screening should stop if you develop a health problem that would prevent you from having lung cancer treatment.  Breast Cancer  Practice breast self-awareness. This means understanding how your breasts normally appear and feel.  It also means doing regular breast self-exams. Let your health care provider know about any changes, no matter how small.  If you are in your 20s or 30s, you should have a clinical breast exam (CBE) by a health care provider every 1-3 years as part of a regular health exam.  If you are 69 or older, have a CBE every year. Also consider having a breast X-ray (mammogram) every year.  If you have a family history of breast cancer, talk to your health care provider about genetic screening.  If you are at high risk for breast cancer, talk to your health care provider about having an MRI and a mammogram every year.  Breast cancer gene (BRCA) assessment is recommended for women who have family members with BRCA-related cancers. BRCA-related cancers include: ? Breast. ? Ovarian. ? Tubal. ? Peritoneal cancers.  Results of the assessment will determine the need for genetic counseling and BRCA1 and BRCA2 testing.  Cervical Cancer Your health care provider may recommend that you be screened regularly for cancer of the pelvic organs (ovaries, uterus, and vagina). This screening involves a pelvic examination, including checking for microscopic changes to the surface of your cervix (Pap test). You may be encouraged to have this screening done every 3 years, beginning at age 35.  For women ages 73-65, health care providers may recommend pelvic exams and Pap testing every 3 years, or they may recommend the Pap and pelvic exam, combined with  testing for human papilloma virus (HPV), every 5 years. Some types of HPV increase your risk of cervical cancer. Testing for HPV may also be done on women of any age with unclear Pap test results.  Other health care providers may not recommend any screening for nonpregnant women who are considered low risk for pelvic cancer and who do not have symptoms. Ask your health care provider if a screening pelvic exam is right for you.  If you have had past treatment for cervical cancer or a condition that could lead to cancer, you need Pap tests and screening for cancer for at least 20 years after your treatment. If Pap tests have been discontinued, your risk factors (such as having a new sexual partner) need to be reassessed to determine if screening should resume. Some women have medical problems that increase the chance of getting cervical cancer. In these cases, your health care provider may recommend more frequent  screening and Pap tests.  Colorectal Cancer  This type of cancer can be detected and often prevented.  Routine colorectal cancer screening usually begins at 82 years of age and continues through 82 years of age.  Your health care provider may recommend screening at an earlier age if you have risk factors for colon cancer.  Your health care provider may also recommend using home test kits to check for hidden blood in the stool.  A small camera at the end of a tube can be used to examine your colon directly (sigmoidoscopy or colonoscopy). This is done to check for the earliest forms of colorectal cancer.  Routine screening usually begins at age 50.  Direct examination of the colon should be repeated every 5-10 years through 82 years of age. However, you may need to be screened more often if early forms of precancerous polyps or small growths are found.  Skin Cancer  Check your skin from head to toe regularly.  Tell your health care provider about any new moles or changes in moles,  especially if there is a change in a mole's shape or color.  Also tell your health care provider if you have a mole that is larger than the size of a pencil eraser.  Always use sunscreen. Apply sunscreen liberally and repeatedly throughout the day.  Protect yourself by wearing long sleeves, pants, a wide-brimmed hat, and sunglasses whenever you are outside.  Heart disease, diabetes, and high blood pressure  High blood pressure causes heart disease and increases the risk of stroke. High blood pressure is more likely to develop in: ? People who have blood pressure in the high end of the normal range (130-139/85-89 mm Hg). ? People who are overweight or obese. ? People who are African American.  If you are 18-39 years of age, have your blood pressure checked every 3-5 years. If you are 40 years of age or older, have your blood pressure checked every year. You should have your blood pressure measured twice-once when you are at a hospital or clinic, and once when you are not at a hospital or clinic. Record the average of the two measurements. To check your blood pressure when you are not at a hospital or clinic, you can use: ? An automated blood pressure machine at a pharmacy. ? A home blood pressure monitor.  If you are between 55 years and 79 years old, ask your health care provider if you should take aspirin to prevent strokes.  Have regular diabetes screenings. This involves taking a blood sample to check your fasting blood sugar level. ? If you are at a normal weight and have a low risk for diabetes, have this test once every three years after 82 years of age. ? If you are overweight and have a high risk for diabetes, consider being tested at a younger age or more often. Preventing infection Hepatitis B  If you have a higher risk for hepatitis B, you should be screened for this virus. You are considered at high risk for hepatitis B if: ? You were born in a country where hepatitis B is  common. Ask your health care provider which countries are considered high risk. ? Your parents were born in a high-risk country, and you have not been immunized against hepatitis B (hepatitis B vaccine). ? You have HIV or AIDS. ? You use needles to inject street drugs. ? You live with someone who has hepatitis B. ? You have had sex with someone   who has hepatitis B. ? You get hemodialysis treatment. ? You take certain medicines for conditions, including cancer, organ transplantation, and autoimmune conditions.  Hepatitis C  Blood testing is recommended for: ? Everyone born from 1945 through 1965. ? Anyone with known risk factors for hepatitis C.  Sexually transmitted infections (STIs)  You should be screened for sexually transmitted infections (STIs) including gonorrhea and chlamydia if: ? You are sexually active and are younger than 82 years of age. ? You are older than 82 years of age and your health care provider tells you that you are at risk for this type of infection. ? Your sexual activity has changed since you were last screened and you are at an increased risk for chlamydia or gonorrhea. Ask your health care provider if you are at risk.  If you do not have HIV, but are at risk, it may be recommended that you take a prescription medicine daily to prevent HIV infection. This is called pre-exposure prophylaxis (PrEP). You are considered at risk if: ? You are sexually active and do not regularly use condoms or know the HIV status of your partner(s). ? You take drugs by injection. ? You are sexually active with a partner who has HIV.  Talk with your health care provider about whether you are at high risk of being infected with HIV. If you choose to begin PrEP, you should first be tested for HIV. You should then be tested every 3 months for as long as you are taking PrEP. Pregnancy  If you are premenopausal and you may become pregnant, ask your health care provider about preconception  counseling.  If you may become pregnant, take 400 to 800 micrograms (mcg) of folic acid every day.  If you want to prevent pregnancy, talk to your health care provider about birth control (contraception). Osteoporosis and menopause  Osteoporosis is a disease in which the bones lose minerals and strength with aging. This can result in serious bone fractures. Your risk for osteoporosis can be identified using a bone density scan.  If you are 65 years of age or older, or if you are at risk for osteoporosis and fractures, ask your health care provider if you should be screened.  Ask your health care provider whether you should take a calcium or vitamin D supplement to lower your risk for osteoporosis.  Menopause may have certain physical symptoms and risks.  Hormone replacement therapy may reduce some of these symptoms and risks. Talk to your health care provider about whether hormone replacement therapy is right for you. Follow these instructions at home:  Schedule regular health, dental, and eye exams.  Stay current with your immunizations.  Do not use any tobacco products including cigarettes, chewing tobacco, or electronic cigarettes.  If you are pregnant, do not drink alcohol.  If you are breastfeeding, limit how much and how often you drink alcohol.  Limit alcohol intake to no more than 1 drink per day for nonpregnant women. One drink equals 12 ounces of beer, 5 ounces of Daryus Sowash, or 1 ounces of hard liquor.  Do not use street drugs.  Do not share needles.  Ask your health care provider for help if you need support or information about quitting drugs.  Tell your health care provider if you often feel depressed.  Tell your health care provider if you have ever been abused or do not feel safe at home. This information is not intended to replace advice given to you by your health   care provider. Make sure you discuss any questions you have with your health care provider. Document  Released: 01/04/2011 Document Revised: 11/27/2015 Document Reviewed: 03/25/2015 Elsevier Interactive Patient Education  2018 Elsevier Inc.  

## 2018-01-18 DIAGNOSIS — E669 Obesity, unspecified: Secondary | ICD-10-CM | POA: Diagnosis not present

## 2018-01-19 ENCOUNTER — Other Ambulatory Visit: Payer: Self-pay | Admitting: Internal Medicine

## 2018-01-19 DIAGNOSIS — E669 Obesity, unspecified: Secondary | ICD-10-CM | POA: Diagnosis not present

## 2018-01-20 DIAGNOSIS — E669 Obesity, unspecified: Secondary | ICD-10-CM | POA: Diagnosis not present

## 2018-01-20 DIAGNOSIS — J9610 Chronic respiratory failure, unspecified whether with hypoxia or hypercapnia: Secondary | ICD-10-CM | POA: Diagnosis not present

## 2018-01-21 DIAGNOSIS — E669 Obesity, unspecified: Secondary | ICD-10-CM | POA: Diagnosis not present

## 2018-01-22 DIAGNOSIS — E669 Obesity, unspecified: Secondary | ICD-10-CM | POA: Diagnosis not present

## 2018-01-23 DIAGNOSIS — E669 Obesity, unspecified: Secondary | ICD-10-CM | POA: Diagnosis not present

## 2018-01-24 DIAGNOSIS — E669 Obesity, unspecified: Secondary | ICD-10-CM | POA: Diagnosis not present

## 2018-01-25 DIAGNOSIS — E669 Obesity, unspecified: Secondary | ICD-10-CM | POA: Diagnosis not present

## 2018-01-26 ENCOUNTER — Other Ambulatory Visit: Payer: Self-pay | Admitting: Internal Medicine

## 2018-01-26 DIAGNOSIS — E669 Obesity, unspecified: Secondary | ICD-10-CM | POA: Diagnosis not present

## 2018-01-27 DIAGNOSIS — E669 Obesity, unspecified: Secondary | ICD-10-CM | POA: Diagnosis not present

## 2018-01-28 DIAGNOSIS — E669 Obesity, unspecified: Secondary | ICD-10-CM | POA: Diagnosis not present

## 2018-01-29 DIAGNOSIS — E669 Obesity, unspecified: Secondary | ICD-10-CM | POA: Diagnosis not present

## 2018-01-30 DIAGNOSIS — E669 Obesity, unspecified: Secondary | ICD-10-CM | POA: Diagnosis not present

## 2018-01-31 DIAGNOSIS — E669 Obesity, unspecified: Secondary | ICD-10-CM | POA: Diagnosis not present

## 2018-02-01 DIAGNOSIS — E669 Obesity, unspecified: Secondary | ICD-10-CM | POA: Diagnosis not present

## 2018-02-02 DIAGNOSIS — E669 Obesity, unspecified: Secondary | ICD-10-CM | POA: Diagnosis not present

## 2018-02-03 DIAGNOSIS — E669 Obesity, unspecified: Secondary | ICD-10-CM | POA: Diagnosis not present

## 2018-02-04 DIAGNOSIS — E669 Obesity, unspecified: Secondary | ICD-10-CM | POA: Diagnosis not present

## 2018-02-05 DIAGNOSIS — E669 Obesity, unspecified: Secondary | ICD-10-CM | POA: Diagnosis not present

## 2018-02-06 DIAGNOSIS — E669 Obesity, unspecified: Secondary | ICD-10-CM | POA: Diagnosis not present

## 2018-02-07 DIAGNOSIS — E669 Obesity, unspecified: Secondary | ICD-10-CM | POA: Diagnosis not present

## 2018-02-08 DIAGNOSIS — E669 Obesity, unspecified: Secondary | ICD-10-CM | POA: Diagnosis not present

## 2018-02-09 DIAGNOSIS — E669 Obesity, unspecified: Secondary | ICD-10-CM | POA: Diagnosis not present

## 2018-02-10 DIAGNOSIS — E669 Obesity, unspecified: Secondary | ICD-10-CM | POA: Diagnosis not present

## 2018-02-11 DIAGNOSIS — E669 Obesity, unspecified: Secondary | ICD-10-CM | POA: Diagnosis not present

## 2018-02-12 DIAGNOSIS — E669 Obesity, unspecified: Secondary | ICD-10-CM | POA: Diagnosis not present

## 2018-02-13 DIAGNOSIS — E669 Obesity, unspecified: Secondary | ICD-10-CM | POA: Diagnosis not present

## 2018-02-15 DIAGNOSIS — E669 Obesity, unspecified: Secondary | ICD-10-CM | POA: Diagnosis not present

## 2018-02-16 DIAGNOSIS — E669 Obesity, unspecified: Secondary | ICD-10-CM | POA: Diagnosis not present

## 2018-02-17 ENCOUNTER — Other Ambulatory Visit: Payer: Self-pay | Admitting: Internal Medicine

## 2018-02-17 DIAGNOSIS — E669 Obesity, unspecified: Secondary | ICD-10-CM | POA: Diagnosis not present

## 2018-02-18 DIAGNOSIS — E669 Obesity, unspecified: Secondary | ICD-10-CM | POA: Diagnosis not present

## 2018-02-19 DIAGNOSIS — E669 Obesity, unspecified: Secondary | ICD-10-CM | POA: Diagnosis not present

## 2018-02-20 DIAGNOSIS — J9610 Chronic respiratory failure, unspecified whether with hypoxia or hypercapnia: Secondary | ICD-10-CM | POA: Diagnosis not present

## 2018-02-21 DIAGNOSIS — E669 Obesity, unspecified: Secondary | ICD-10-CM | POA: Diagnosis not present

## 2018-02-22 DIAGNOSIS — E669 Obesity, unspecified: Secondary | ICD-10-CM | POA: Diagnosis not present

## 2018-02-23 DIAGNOSIS — E669 Obesity, unspecified: Secondary | ICD-10-CM | POA: Diagnosis not present

## 2018-02-24 DIAGNOSIS — E669 Obesity, unspecified: Secondary | ICD-10-CM | POA: Diagnosis not present

## 2018-02-25 DIAGNOSIS — E669 Obesity, unspecified: Secondary | ICD-10-CM | POA: Diagnosis not present

## 2018-02-26 DIAGNOSIS — E669 Obesity, unspecified: Secondary | ICD-10-CM | POA: Diagnosis not present

## 2018-02-27 DIAGNOSIS — E669 Obesity, unspecified: Secondary | ICD-10-CM | POA: Diagnosis not present

## 2018-02-28 DIAGNOSIS — E669 Obesity, unspecified: Secondary | ICD-10-CM | POA: Diagnosis not present

## 2018-03-01 DIAGNOSIS — E669 Obesity, unspecified: Secondary | ICD-10-CM | POA: Diagnosis not present

## 2018-03-02 DIAGNOSIS — E669 Obesity, unspecified: Secondary | ICD-10-CM | POA: Diagnosis not present

## 2018-03-03 DIAGNOSIS — E669 Obesity, unspecified: Secondary | ICD-10-CM | POA: Diagnosis not present

## 2018-03-04 DIAGNOSIS — E669 Obesity, unspecified: Secondary | ICD-10-CM | POA: Diagnosis not present

## 2018-03-05 DIAGNOSIS — E669 Obesity, unspecified: Secondary | ICD-10-CM | POA: Diagnosis not present

## 2018-03-07 DIAGNOSIS — E669 Obesity, unspecified: Secondary | ICD-10-CM | POA: Diagnosis not present

## 2018-03-08 DIAGNOSIS — E669 Obesity, unspecified: Secondary | ICD-10-CM | POA: Diagnosis not present

## 2018-03-09 DIAGNOSIS — E669 Obesity, unspecified: Secondary | ICD-10-CM | POA: Diagnosis not present

## 2018-03-10 DIAGNOSIS — E669 Obesity, unspecified: Secondary | ICD-10-CM | POA: Diagnosis not present

## 2018-03-11 DIAGNOSIS — E669 Obesity, unspecified: Secondary | ICD-10-CM | POA: Diagnosis not present

## 2018-03-11 NOTE — Progress Notes (Signed)
Subjective:    Patient ID: Shawna Fuentes, female    DOB: 1933-05-07, 84 y.o.   MRN: [id number]  HPI The patient is here for an acute visit for right leg swelling.  She was here last month for increased bilateral leg swelling-acute on chronic.  She does take 20 mg of Lasix daily.  We thought the increased swelling may have been related to heat and increased salt intake.  I increased her Lasix to 40 mg for 2-3 days and then she was to go back to her 20 mg daily.  The swelling improved with the increased dose.    Her right leg has been more swollen for just over one week.  It is painful to walk and sometimes when she is sleeping.  She denies redness.  She takes tylenol as needed and it helps.  She is compliant with a low sodium diet.  She elevates her legs when sitting. It does go down overnight.  Since the swelling started and it is painful she has been less active and is not walking around his usual.  She denies any breaks in her skin or injuries to her lower leg.   Medications and allergies reviewed with patient and updated if appropriate.  Patient Active Problem List   Diagnosis Date Noted  . Prediabetes 12/04/2017  . Cough 07/10/2016  . CKD (chronic kidney disease) stage 3, GFR 30-59 ml/min (HCC) 06/08/2016  . Urinary incontinence 05/13/2016  . Obstructive sleep apnea 09/15/2014  . Osteoporosis 07/15/2014  . Arthritis pain of hand 07/10/2014  . Anemia, iron deficiency 07/10/2014  . Deep vein thrombosis ([facility]) 12/24/2013  . Chronic respiratory failure with hypercapnia ([facility]) 12/21/2013  . Post-surgical hypothyroidism 12/21/2013  . Hypoxemia 12/20/2013  . Edema 12/12/2013  . Fracture, subtrochanteric, right femur, closed ([facility]) 09/22/2013  . Chronic radicular low back pain 12/30/2012  . Osteoarthritis of right knee 12/30/2012  . Hyperlipidemia   . Hypertension     Current Outpatient Medications on File Prior to Visit  Medication Sig Dispense Refill  . amLODipine (NORVASC) 2.5  MG tablet TAKE 1 TABLET BY MOUTH EVERY DAY 90 tablet 1  . aspirin 81 MG tablet Take 1 tablet (81 mg total) by mouth daily. 30 tablet   . atorvastatin (LIPITOR) 10 MG tablet TAKE 1 TABLET BY MOUTH EVERY EVENING 90 tablet 3  . calcitRIOL (ROCALTROL) 0.25 MCG capsule TAKE 1 CAPSULE (0.25 MCG TOTAL) BY MOUTH 2 (TWO) TIMES DAILY. 180 capsule 3  . calcium carbonate (OS-CAL) 600 MG TABS tablet Take 1 tablet (600 mg total) by mouth 2 (two) times daily with a meal. 180 tablet 3  . famotidine (PEPCID) 20 MG tablet TAKE 1 TABLET BY MOUTH EVERYDAY AT BEDTIME 90 tablet 1  . furosemide (LASIX) 20 MG tablet Take 1 tablet (20 mg total) by mouth daily. 10 tablet 0  . levothyroxine (SYNTHROID, LEVOTHROID) 100 MCG tablet Take 1 tablet (100 mcg total) by mouth daily. 90 tablet 1  . losartan (COZAAR) 100 MG tablet TAKE 1 TABLET BY MOUTH EVERY DAY 90 tablet 3  . metoprolol tartrate (LOPRESSOR) 25 MG tablet TAKE 1/2 TABLET BY MOUTH TWICE A DAY 90 tablet 1  . nystatin (MYCOSTATIN/NYSTOP) 100000 UNIT/GM POWD Apply to affected area 3 times a day 60 g 0  . Vitamin D, Ergocalciferol, (DRISDOL) 50000 UNITS CAPS capsule Take 50,000 Units by mouth every 7 (seven) days.      No current facility-administered medications on file prior to visit.  Past Medical History:  Diagnosis Date  . Arthritis   . DVT (deep venous thrombosis) ([facility]) 12/2013   bilateral upper extremity DVT found at time of thyroidectomy   . History of kidney stones   . Hyperlipidemia   . Hypertension   . Hypothyroidism (acquired) 12/2013   s/p thyroidectomy 12/21/13 at [facility]  . Osteoporosis 07/15/2014   DEXA @ [facility] 07/14/14: -3.1  . Thyroid goiter    s/p thyroidectomy - pathology demonstrated nodular hyperplasia    Past Surgical History:  Procedure Laterality Date  . FEMUR IM NAIL Right 09/22/2013   Procedure: INTRAMEDULLARY (IM) NAIL FEMORAL;  Surgeon: Wylene Simmer, MD;  Location: [facility];  Service: Orthopedics;  Laterality: Right;  . HIP SURGERY    .  THYROIDECTOMY WITH STERNOTOMY  12/21/13   thyroid mass found incidentally on CT scan 12/2013; transferred to [facility] for surgical removal of thyroid and goiter requiring sternotomy    Social History   Socioeconomic History  . Marital status: Widowed    Spouse name: Not on file  . Number of children: 5  . Years of education: Not on file  . Highest education level: Not on file  Occupational History  . Occupation: retired  Scientific laboratory technician  . Financial resource strain: Not hard at all  . Food insecurity:    Worry: Never true    Inability: Never true  . Transportation needs:    Medical: No    Non-medical: No  Tobacco Use  . Smoking status: Never Smoker  . Smokeless tobacco: Never Used  . Tobacco comment: lives w/ dtr dorthea, 2nd dtr and 3 sons in town  Substance and Sexual Activity  . Alcohol use: No    Alcohol/week: 0.0 standard drinks  . Drug use: No  . Sexual activity: Not Currently  Lifestyle  . Physical activity:    Days per week: 0 days    Minutes per session: 0 min  . Stress: Not at all  Relationships  . Social connections:    Talks on phone: More than three times a week    Gets together: More than three times a week    Attends religious service: More than 4 times per year    Active member of club or organization: Yes    Attends meetings of clubs or organizations: More than 4 times per year    Relationship status: Widowed  Other Topics Concern  . 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   Lives with daughter dorthea - has 3 sons and another daughter in town, supportive    Family History  Problem Relation Age of Onset  . Osteoarthritis Mother   . Hypertension Mother   . Hypertension Father   . Cancer Father        unknown type  . Diabetes Son   . Diabetes Son   . Clotting disorder Unknown   . Rheumatologic disease Unknown     Review of Systems    Constitutional: Negative for chills and fever.  Respiratory: Negative for cough, shortness of breath and wheezing.   Cardiovascular: Positive for leg swelling. Negative for chest pain and palpitations.  Neurological: Negative for light-headedness and headaches.       Objective:   Vitals:   03/13/18 0955  BP: (!) 158/86  Pulse: 71  Resp: 20  Temp: 98.2 F (36.8 C)  SpO2: 96%   BP Readings from Last  3 Encounters:  03/13/18 (!) 158/86  01/17/18 134/72  01/09/18 (!) 156/88   Wt Readings from Last 3 Encounters:  03/13/18 264 lb (119.7 kg)  01/17/18 264 lb (119.7 kg)  01/09/18 264 lb (119.7 kg)   Body mass index is 46.77 kg/m.   Physical Exam    Constitutional: Appears well-developed and well-nourished. No distress.  HENT:  Head: Normocephalic and atraumatic.  Neck: Neck supple. No tracheal deviation present. No thyromegaly present.  No cervical lymphadenopathy Cardiovascular: Normal rate, regular rhythm and normal heart sounds.     RLE with mildly pitting edema > left; RLE with mild erythema and warmth Pulmonary/Chest: Effort normal and breath sounds normal. No respiratory distress. No has no wheezes. No rales.  Skin: Skin is warm and dry. Not diaphoretic.  Psychiatric: Normal mood and affect. Behavior is normal.       Assessment & Plan:    See Problem List for Assessment and Plan of chronic medical problems.

## 2018-03-13 ENCOUNTER — Ambulatory Visit (INDEPENDENT_AMBULATORY_CARE_PROVIDER_SITE_OTHER): Payer: Medicare HMO | Admitting: Internal Medicine

## 2018-03-13 ENCOUNTER — Ambulatory Visit (HOSPITAL_COMMUNITY)
Admission: RE | Admit: 2018-03-13 | Discharge: 2018-03-13 | Disposition: A | Discharge status: Home / Self Care | Payer: Medicare HMO | Source: Ambulatory Visit | Attending: Surgery | Admitting: Surgery

## 2018-03-13 ENCOUNTER — Encounter: Payer: Self-pay | Admitting: Internal Medicine

## 2018-03-13 VITALS — BP 158/86 | HR 71 | Temp 98.2°F | Resp 20 | Ht 63.0 in | Wt 264.0 lb

## 2018-03-13 DIAGNOSIS — E669 Obesity, unspecified: Secondary | ICD-10-CM | POA: Diagnosis not present

## 2018-03-13 DIAGNOSIS — M79661 Pain in right lower leg: Secondary | ICD-10-CM

## 2018-03-13 DIAGNOSIS — M7989 Other specified soft tissue disorders: Secondary | ICD-10-CM

## 2018-03-13 DIAGNOSIS — Z23 Encounter for immunization: Secondary | ICD-10-CM | POA: Diagnosis not present

## 2018-03-13 DIAGNOSIS — Z6841 Body Mass Index (BMI) 40.0 and over, adult: Secondary | ICD-10-CM | POA: Insufficient documentation

## 2018-03-13 MED ORDER — CEPHALEXIN 500 MG PO CAPS: 500.0000 mg | ORAL_CAPSULE | Freq: Three times a day (TID) | ORAL | 0 refills | Status: DC

## 2018-03-13 NOTE — Patient Instructions (Addendum)
A ultrasound was ordered to rule out a blood clot.  We will call you with the results.   Take the antibiotic as prescribed.    Take lasix 40 mg daily x 2 days and then go back to the 20 mg daily.     Call if no improvement

## 2018-03-13 NOTE — Assessment & Plan Note (Signed)
Pain , redness and swelling R/o DVT given history of prior DVT - US today Possible mild cellulitis - start keflex TID x 10 days Increase lasix to 40 mg daily x 2 days then return to 20 mg daily  Call if no improvement

## 2018-03-17 ENCOUNTER — Telehealth: Payer: Self-pay | Admitting: Internal Medicine

## 2018-03-17 NOTE — Telephone Encounter (Signed)
Insurance has been submitted and verified for Prolia. Patient is responsible for a $0 copay. Due on or after 06/08/2018 Left message for patient to call back to schedule.  Okay to schedule... Visit Note: Prolia ($0 copay - okay to give per Gareth Eagle) Visit Type: Nurse Provider: Nurse

## 2018-03-20 DIAGNOSIS — E669 Obesity, unspecified: Secondary | ICD-10-CM | POA: Diagnosis not present

## 2018-03-21 DIAGNOSIS — E669 Obesity, unspecified: Secondary | ICD-10-CM | POA: Diagnosis not present

## 2018-03-22 DIAGNOSIS — E669 Obesity, unspecified: Secondary | ICD-10-CM | POA: Diagnosis not present

## 2018-03-23 DIAGNOSIS — J9610 Chronic respiratory failure, unspecified whether with hypoxia or hypercapnia: Secondary | ICD-10-CM | POA: Diagnosis not present

## 2018-03-23 DIAGNOSIS — E669 Obesity, unspecified: Secondary | ICD-10-CM | POA: Diagnosis not present

## 2018-03-24 DIAGNOSIS — E669 Obesity, unspecified: Secondary | ICD-10-CM | POA: Diagnosis not present

## 2018-03-25 DIAGNOSIS — E669 Obesity, unspecified: Secondary | ICD-10-CM | POA: Diagnosis not present

## 2018-03-26 DIAGNOSIS — E669 Obesity, unspecified: Secondary | ICD-10-CM | POA: Diagnosis not present

## 2018-03-27 DIAGNOSIS — E669 Obesity, unspecified: Secondary | ICD-10-CM | POA: Diagnosis not present

## 2018-03-28 DIAGNOSIS — E669 Obesity, unspecified: Secondary | ICD-10-CM | POA: Diagnosis not present

## 2018-03-29 DIAGNOSIS — E669 Obesity, unspecified: Secondary | ICD-10-CM | POA: Diagnosis not present

## 2018-03-30 DIAGNOSIS — E669 Obesity, unspecified: Secondary | ICD-10-CM | POA: Diagnosis not present

## 2018-03-31 DIAGNOSIS — E669 Obesity, unspecified: Secondary | ICD-10-CM | POA: Diagnosis not present

## 2018-04-01 DIAGNOSIS — E669 Obesity, unspecified: Secondary | ICD-10-CM | POA: Diagnosis not present

## 2018-04-02 DIAGNOSIS — E669 Obesity, unspecified: Secondary | ICD-10-CM | POA: Diagnosis not present

## 2018-04-03 DIAGNOSIS — E669 Obesity, unspecified: Secondary | ICD-10-CM | POA: Diagnosis not present

## 2018-04-04 DIAGNOSIS — E669 Obesity, unspecified: Secondary | ICD-10-CM | POA: Diagnosis not present

## 2018-04-05 DIAGNOSIS — E669 Obesity, unspecified: Secondary | ICD-10-CM | POA: Diagnosis not present

## 2018-04-06 DIAGNOSIS — E669 Obesity, unspecified: Secondary | ICD-10-CM | POA: Diagnosis not present

## 2018-04-07 DIAGNOSIS — E669 Obesity, unspecified: Secondary | ICD-10-CM | POA: Diagnosis not present

## 2018-04-08 DIAGNOSIS — E669 Obesity, unspecified: Secondary | ICD-10-CM | POA: Diagnosis not present

## 2018-04-09 DIAGNOSIS — E669 Obesity, unspecified: Secondary | ICD-10-CM | POA: Diagnosis not present

## 2018-04-10 DIAGNOSIS — E669 Obesity, unspecified: Secondary | ICD-10-CM | POA: Diagnosis not present

## 2018-04-11 DIAGNOSIS — E669 Obesity, unspecified: Secondary | ICD-10-CM | POA: Diagnosis not present

## 2018-04-12 DIAGNOSIS — E669 Obesity, unspecified: Secondary | ICD-10-CM | POA: Diagnosis not present

## 2018-04-13 DIAGNOSIS — E669 Obesity, unspecified: Secondary | ICD-10-CM | POA: Diagnosis not present

## 2018-04-14 DIAGNOSIS — E669 Obesity, unspecified: Secondary | ICD-10-CM | POA: Diagnosis not present

## 2018-04-15 DIAGNOSIS — E669 Obesity, unspecified: Secondary | ICD-10-CM | POA: Diagnosis not present

## 2018-04-16 DIAGNOSIS — E669 Obesity, unspecified: Secondary | ICD-10-CM | POA: Diagnosis not present

## 2018-04-17 DIAGNOSIS — E669 Obesity, unspecified: Secondary | ICD-10-CM | POA: Diagnosis not present

## 2018-04-18 DIAGNOSIS — E669 Obesity, unspecified: Secondary | ICD-10-CM | POA: Diagnosis not present

## 2018-04-19 DIAGNOSIS — E669 Obesity, unspecified: Secondary | ICD-10-CM | POA: Diagnosis not present

## 2018-04-20 ENCOUNTER — Other Ambulatory Visit: Payer: Self-pay | Admitting: Internal Medicine

## 2018-04-20 DIAGNOSIS — E669 Obesity, unspecified: Secondary | ICD-10-CM | POA: Diagnosis not present

## 2018-04-21 DIAGNOSIS — E669 Obesity, unspecified: Secondary | ICD-10-CM | POA: Diagnosis not present

## 2018-04-22 DIAGNOSIS — E669 Obesity, unspecified: Secondary | ICD-10-CM | POA: Diagnosis not present

## 2018-04-22 DIAGNOSIS — J9610 Chronic respiratory failure, unspecified whether with hypoxia or hypercapnia: Secondary | ICD-10-CM | POA: Diagnosis not present

## 2018-04-23 DIAGNOSIS — E669 Obesity, unspecified: Secondary | ICD-10-CM | POA: Diagnosis not present

## 2018-04-24 DIAGNOSIS — E669 Obesity, unspecified: Secondary | ICD-10-CM | POA: Diagnosis not present

## 2018-04-25 DIAGNOSIS — E669 Obesity, unspecified: Secondary | ICD-10-CM | POA: Diagnosis not present

## 2018-04-26 DIAGNOSIS — E669 Obesity, unspecified: Secondary | ICD-10-CM | POA: Diagnosis not present

## 2018-04-28 DIAGNOSIS — E669 Obesity, unspecified: Secondary | ICD-10-CM | POA: Diagnosis not present

## 2018-04-29 DIAGNOSIS — E669 Obesity, unspecified: Secondary | ICD-10-CM | POA: Diagnosis not present

## 2018-04-30 DIAGNOSIS — E669 Obesity, unspecified: Secondary | ICD-10-CM | POA: Diagnosis not present

## 2018-05-01 DIAGNOSIS — E669 Obesity, unspecified: Secondary | ICD-10-CM | POA: Diagnosis not present

## 2018-05-02 DIAGNOSIS — E669 Obesity, unspecified: Secondary | ICD-10-CM | POA: Diagnosis not present

## 2018-05-03 DIAGNOSIS — E669 Obesity, unspecified: Secondary | ICD-10-CM | POA: Diagnosis not present

## 2018-05-04 DIAGNOSIS — E669 Obesity, unspecified: Secondary | ICD-10-CM | POA: Diagnosis not present

## 2018-05-05 DIAGNOSIS — E669 Obesity, unspecified: Secondary | ICD-10-CM | POA: Diagnosis not present

## 2018-05-06 DIAGNOSIS — E669 Obesity, unspecified: Secondary | ICD-10-CM | POA: Diagnosis not present

## 2018-05-07 DIAGNOSIS — E669 Obesity, unspecified: Secondary | ICD-10-CM | POA: Diagnosis not present

## 2018-05-08 DIAGNOSIS — E669 Obesity, unspecified: Secondary | ICD-10-CM | POA: Diagnosis not present

## 2018-05-09 DIAGNOSIS — E669 Obesity, unspecified: Secondary | ICD-10-CM | POA: Diagnosis not present

## 2018-05-10 DIAGNOSIS — E669 Obesity, unspecified: Secondary | ICD-10-CM | POA: Diagnosis not present

## 2018-05-11 DIAGNOSIS — E669 Obesity, unspecified: Secondary | ICD-10-CM | POA: Diagnosis not present

## 2018-05-12 DIAGNOSIS — E669 Obesity, unspecified: Secondary | ICD-10-CM | POA: Diagnosis not present

## 2018-05-13 DIAGNOSIS — E669 Obesity, unspecified: Secondary | ICD-10-CM | POA: Diagnosis not present

## 2018-05-14 DIAGNOSIS — E669 Obesity, unspecified: Secondary | ICD-10-CM | POA: Diagnosis not present

## 2018-05-15 DIAGNOSIS — E669 Obesity, unspecified: Secondary | ICD-10-CM | POA: Diagnosis not present

## 2018-05-16 DIAGNOSIS — E669 Obesity, unspecified: Secondary | ICD-10-CM | POA: Diagnosis not present

## 2018-05-17 DIAGNOSIS — E669 Obesity, unspecified: Secondary | ICD-10-CM | POA: Diagnosis not present

## 2018-05-18 DIAGNOSIS — E669 Obesity, unspecified: Secondary | ICD-10-CM | POA: Diagnosis not present

## 2018-05-19 DIAGNOSIS — E669 Obesity, unspecified: Secondary | ICD-10-CM | POA: Diagnosis not present

## 2018-05-21 DIAGNOSIS — E669 Obesity, unspecified: Secondary | ICD-10-CM | POA: Diagnosis not present

## 2018-05-22 DIAGNOSIS — E669 Obesity, unspecified: Secondary | ICD-10-CM | POA: Diagnosis not present

## 2018-05-23 DIAGNOSIS — J9610 Chronic respiratory failure, unspecified whether with hypoxia or hypercapnia: Secondary | ICD-10-CM | POA: Diagnosis not present

## 2018-05-23 DIAGNOSIS — E669 Obesity, unspecified: Secondary | ICD-10-CM | POA: Diagnosis not present

## 2018-05-24 DIAGNOSIS — E669 Obesity, unspecified: Secondary | ICD-10-CM | POA: Diagnosis not present

## 2018-05-25 DIAGNOSIS — E669 Obesity, unspecified: Secondary | ICD-10-CM | POA: Diagnosis not present

## 2018-05-26 DIAGNOSIS — E669 Obesity, unspecified: Secondary | ICD-10-CM | POA: Diagnosis not present

## 2018-05-27 DIAGNOSIS — E669 Obesity, unspecified: Secondary | ICD-10-CM | POA: Diagnosis not present

## 2018-05-28 ENCOUNTER — Other Ambulatory Visit: Payer: Self-pay | Admitting: Internal Medicine

## 2018-05-28 DIAGNOSIS — E669 Obesity, unspecified: Secondary | ICD-10-CM | POA: Diagnosis not present

## 2018-05-29 DIAGNOSIS — E669 Obesity, unspecified: Secondary | ICD-10-CM | POA: Diagnosis not present

## 2018-05-30 DIAGNOSIS — E669 Obesity, unspecified: Secondary | ICD-10-CM | POA: Diagnosis not present

## 2018-05-31 DIAGNOSIS — E669 Obesity, unspecified: Secondary | ICD-10-CM | POA: Diagnosis not present

## 2018-06-02 DIAGNOSIS — E669 Obesity, unspecified: Secondary | ICD-10-CM | POA: Diagnosis not present

## 2018-06-03 DIAGNOSIS — E669 Obesity, unspecified: Secondary | ICD-10-CM | POA: Diagnosis not present

## 2018-06-05 ENCOUNTER — Other Ambulatory Visit: Payer: Self-pay | Admitting: Internal Medicine

## 2018-06-06 ENCOUNTER — Other Ambulatory Visit: Payer: Medicare HMO

## 2018-06-06 ENCOUNTER — Ambulatory Visit: Payer: Medicare HMO | Admitting: Internal Medicine

## 2018-06-07 NOTE — Patient Instructions (Addendum)
  Tests ordered today. Your results will be released to [facility] (or called to you) after review, usually within 72hours after test completion. If any changes need to be made, you will be notified at that same time.  All other Health Maintenance issues reviewed.   All recommended immunizations and age-appropriate screenings are up-to-date or discussed.  prolia administered today.   Medications reviewed and updated.  Changes include :   none   Please followup in 55months

## 2018-06-07 NOTE — Progress Notes (Signed)
Subjective:    Patient ID: Shawna Fuentes, female    DOB: December 16, 1932, 84 y.o.   MRN: [id number]  HPI The patient is here for follow up.  Right lower leg pain;  She has right lower leg pain anteriorly when she wakes.  She takes tylenol.    Osteoporosis:  She is due for a prolia today.  She is taking calcium and vitamin d daily.  She is  exercising some - walking with walker.  Hypertension, leg edema: She is taking her medication daily. She is compliant with a low sodium diet.  Her leg edema is controlled with lasix daily.  She denies chest pain, palpitations, shortness of breath and regular headaches. She is exercising some - walks with walker.  She does not monitor her blood pressure at home.    Hyperlipidemia: She is taking her medication daily. She is compliant with a low fat/cholesterol diet. She is exercising regularly. She denies myalgias.   Hypothyroidism:  She is taking her medication daily.  She denies any recent changes in energy or weight that are unexplained.   Prediabetes:  She is compliant with a low sugar/carbohydrate diet.  She is exercising regularly.  CKD;  She drinking water throughout the day and does not take any nsaids.  She is taking her medications daily.    Medications and allergies reviewed with patient and updated if appropriate.  Patient Active Problem List   Diagnosis Date Noted  . Pain and swelling of right lower leg 03/13/2018  . Prediabetes 12/04/2017  . Cough 07/10/2016  . CKD (chronic kidney disease) stage 3, GFR 30-59 ml/min (HCC) 06/08/2016  . Urinary incontinence 05/13/2016  . Obstructive sleep apnea 09/15/2014  . Osteoporosis 07/15/2014  . Arthritis pain of hand 07/10/2014  . Deep vein thrombosis ([place]) 12/24/2013  . Chronic respiratory failure with hypercapnia ([place]) 12/21/2013  . Post-surgical hypothyroidism 12/21/2013  . Hypoxemia 12/20/2013  . Edema 12/12/2013  . Fracture, subtrochanteric, right femur, closed ([place]) 09/22/2013  .  Chronic radicular low back pain 12/30/2012  . Osteoarthritis of right knee 12/30/2012  . Hyperlipidemia   . Hypertension     Current Outpatient Medications on File Prior to Visit  Medication Sig Dispense Refill  . amLODipine (NORVASC) 2.5 MG tablet TAKE 1 TABLET BY MOUTH EVERY DAY 90 tablet 1  . aspirin 81 MG tablet Take 1 tablet (81 mg total) by mouth daily. 30 tablet   . atorvastatin (LIPITOR) 10 MG tablet TAKE 1 TABLET BY MOUTH EVERY EVENING 90 tablet 1  . calcitRIOL (ROCALTROL) 0.25 MCG capsule TAKE 1 CAPSULE (0.25 MCG TOTAL) BY MOUTH 2 (TWO) TIMES DAILY. 180 capsule 3  . calcium carbonate (OS-CAL) 600 MG TABS tablet Take 1 tablet (600 mg total) by mouth 2 (two) times daily with a meal. 180 tablet 3  . famotidine (PEPCID) 20 MG tablet TAKE 1 TABLET BY MOUTH EVERYDAY AT BEDTIME 90 tablet 1  . levothyroxine (SYNTHROID, LEVOTHROID) 100 MCG tablet TAKE 1 TABLET BY MOUTH EVERY DAY 90 tablet 1  . losartan (COZAAR) 100 MG tablet TAKE 1 TABLET BY MOUTH EVERY DAY 90 tablet 3  . metoprolol tartrate (LOPRESSOR) 25 MG tablet TAKE 1/2 TABLET BY MOUTH TWICE A DAY 90 tablet 1  . nystatin (MYCOSTATIN/NYSTOP) 100000 UNIT/GM POWD Apply to affected area 3 times a day 60 g 0  . Vitamin D, Ergocalciferol, (DRISDOL) 50000 UNITS CAPS capsule Take 50,000 Units by mouth every 7 (seven) days.      No current facility-administered medications  on file prior to visit.     Past Medical History:  Diagnosis Date  . Arthritis   . DVT (deep venous thrombosis) ([place]) 12/2013   bilateral upper extremity DVT found at time of thyroidectomy   . History of kidney stones   . Hyperlipidemia   . Hypertension   . Hypothyroidism (acquired) 12/2013   s/p thyroidectomy 12/21/13 at [facility]  . Osteoporosis 07/15/2014   DEXA @ [facility] 07/14/14: -3.1  . Thyroid goiter    s/p thyroidectomy - pathology demonstrated nodular hyperplasia    Past Surgical History:  Procedure Laterality Date  . FEMUR IM NAIL Right 09/22/2013   Procedure:  INTRAMEDULLARY (IM) NAIL FEMORAL;  Surgeon: Wylene Simmer, MD;  Location: [place];  Service: Orthopedics;  Laterality: Right;  . HIP SURGERY    . THYROIDECTOMY WITH STERNOTOMY  12/21/13   thyroid mass found incidentally on CT scan 12/2013; transferred to [facility] for surgical removal of thyroid and goiter requiring sternotomy    Social History   Socioeconomic History  . Marital status: Widowed    Spouse name: Not on file  . Number of children: 5  . Years of education: Not on file  . Highest education level: Not on file  Occupational History  . Occupation: retired  Scientific laboratory technician  . Financial resource strain: Not hard at all  . Food insecurity:    Worry: Never true    Inability: Never true  . Transportation needs:    Medical: No    Non-medical: No  Tobacco Use  . Smoking status: Never Smoker  . Smokeless tobacco: Never Used  . Tobacco comment: lives w/ dtr dorthea, 2nd dtr and 3 sons in town  Substance and Sexual Activity  . Alcohol use: No    Alcohol/week: 0.0 standard drinks  . Drug use: No  . Sexual activity: Not Currently  Lifestyle  . Physical activity:    Days per week: 0 days    Minutes per session: 0 min  . Stress: Not at all  Relationships  . Social connections:    Talks on phone: More than three times a week    Gets together: More than three times a week    Attends religious service: More than 4 times per year    Active member of club or organization: Yes    Attends meetings of clubs or organizations: More than 4 times per year    Relationship status: Widowed  Other Topics Concern  . 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   Lives with daughter dorthea - has 3 sons and another daughter in town, supportive    Family History  Problem Relation Age of Onset  . Osteoarthritis Mother   . Hypertension Mother   . Hypertension Father   . Cancer Father        unknown  type  . Diabetes Son   . Diabetes Son   . Clotting disorder Unknown   . Rheumatologic disease Unknown     Review of Systems  Constitutional: Negative for chills and fever.  Respiratory: Negative for cough, shortness of breath and wheezing.   Cardiovascular: Positive for leg swelling (chronic). Negative for chest pain and palpitations.  Neurological: Negative for light-headedness and headaches.       Objective:   Vitals:   06/08/18 0928  BP: 132/82  Pulse: 79  Temp: 98.2 F (36.8 C)   BP Readings  from Last 3 Encounters:  06/08/18 132/82  03/13/18 (!) 158/86  01/17/18 134/72   Wt Readings from Last 3 Encounters:  06/08/18 263 lb 12.8 oz (119.7 kg)  03/13/18 264 lb (119.7 kg)  01/17/18 264 lb (119.7 kg)   Body mass index is 46.73 kg/m.   Physical Exam    Constitutional: Appears well-developed and well-nourished. No distress.  HENT:  Head: Normocephalic and atraumatic.  Neck: Neck supple. No tracheal deviation present. No thyromegaly present.  No cervical lymphadenopathy Cardiovascular: Normal rate, regular rhythm and normal heart sounds.   No murmur heard.   Trace bilateral lower extremity nonpitting edema Pulmonary/Chest: Effort normal and breath sounds normal. No respiratory distress. No has no wheezes. No rales.  Skin: Skin is warm and dry. Not diaphoretic.  Psychiatric: Normal mood and affect. Behavior is normal.      Assessment & Plan:    See Problem List for Assessment and Plan of chronic medical problems.

## 2018-06-08 ENCOUNTER — Encounter: Payer: Self-pay | Admitting: Internal Medicine

## 2018-06-08 ENCOUNTER — Ambulatory Visit (INDEPENDENT_AMBULATORY_CARE_PROVIDER_SITE_OTHER): Payer: Medicare HMO | Admitting: Internal Medicine

## 2018-06-08 ENCOUNTER — Other Ambulatory Visit (INDEPENDENT_AMBULATORY_CARE_PROVIDER_SITE_OTHER): Payer: Medicare HMO

## 2018-06-08 ENCOUNTER — Ambulatory Visit (INDEPENDENT_AMBULATORY_CARE_PROVIDER_SITE_OTHER)
Admission: RE | Admit: 2018-06-08 | Discharge: 2018-06-08 | Disposition: A | Discharge status: Home / Self Care | Payer: Medicare HMO | Source: Ambulatory Visit | Attending: Internal Medicine | Admitting: Internal Medicine

## 2018-06-08 VITALS — BP 132/82 | HR 79 | Temp 98.2°F | Wt 263.8 lb

## 2018-06-08 DIAGNOSIS — M810 Age-related osteoporosis without current pathological fracture: Secondary | ICD-10-CM | POA: Diagnosis not present

## 2018-06-08 DIAGNOSIS — N183 Chronic kidney disease, stage 3 unspecified: Secondary | ICD-10-CM

## 2018-06-08 DIAGNOSIS — E7849 Other hyperlipidemia: Secondary | ICD-10-CM

## 2018-06-08 DIAGNOSIS — E890 Postprocedural hypothyroidism: Secondary | ICD-10-CM | POA: Diagnosis not present

## 2018-06-08 DIAGNOSIS — I10 Essential (primary) hypertension: Secondary | ICD-10-CM

## 2018-06-08 DIAGNOSIS — R600 Localized edema: Secondary | ICD-10-CM | POA: Diagnosis not present

## 2018-06-08 DIAGNOSIS — R7303 Prediabetes: Secondary | ICD-10-CM

## 2018-06-08 LAB — COMPREHENSIVE METABOLIC PANEL
ALT: 15 U/L (ref 0–35)
AST: 16 U/L (ref 0–37)
Albumin: 4.1 g/dL (ref 3.5–5.2)
Alkaline Phosphatase: 82 U/L (ref 39–117)
BUN: 29 mg/dL — ABNORMAL HIGH (ref 6–23)
CO2: 26 mEq/L (ref 19–32)
Calcium: 9.6 mg/dL (ref 8.4–10.5)
Chloride: 103 mEq/L (ref 96–112)
Creatinine, Ser: 1.27 mg/dL — ABNORMAL HIGH (ref 0.40–1.20)
GFR: 51.62 mL/min — ABNORMAL LOW (ref >60.00)
GLUCOSE: 90 mg/dL (ref 70–99)
Potassium: 3.9 mEq/L (ref 3.5–5.1)
Sodium: 142 mEq/L (ref 135–145)
Total Bilirubin: 0.7 mg/dL (ref 0.2–1.2)
Total Protein: 8 g/dL (ref 6.0–8.3)

## 2018-06-08 LAB — LIPID PANEL
Cholesterol: 158 mg/dL (ref 0–200)
HDL: 59.2 mg/dL (ref >39.00)
LDL CALC: 79 mg/dL (ref 0–99)
NonHDL: 98.3
Total CHOL/HDL Ratio: 3
Triglycerides: 98 mg/dL (ref 0.0–149.0)
VLDL: 19.6 mg/dL (ref 0.0–40.0)

## 2018-06-08 LAB — CBC WITH DIFFERENTIAL/PLATELET
Basophils Absolute: 0.1 10*3/uL (ref 0.0–0.1)
Basophils Relative: 1 % (ref 0.0–3.0)
EOS ABS: 0.2 10*3/uL (ref 0.0–0.7)
Eosinophils Relative: 3.8 % (ref 0.0–5.0)
HCT: 40.3 % (ref 36.0–46.0)
Hemoglobin: 13.3 g/dL (ref 12.0–15.0)
Lymphocytes Relative: 34.2 % (ref 12.0–46.0)
Lymphs Abs: 2.1 10*3/uL (ref 0.7–4.0)
MCHC: 33.1 g/dL (ref 30.0–36.0)
MCV: 90.3 fl (ref 78.0–100.0)
Monocytes Absolute: 0.4 10*3/uL (ref 0.1–1.0)
Monocytes Relative: 6.6 % (ref 3.0–12.0)
NEUTROS ABS: 3.3 10*3/uL (ref 1.4–7.7)
NEUTROS PCT: 54.4 % (ref 43.0–77.0)
Platelets: 167 10*3/uL (ref 150.0–400.0)
RBC: 4.46 Mil/uL (ref 3.87–5.11)
RDW: 14.4 % (ref 11.5–15.5)
WBC: 6 10*3/uL (ref 4.0–10.5)

## 2018-06-08 LAB — HEMOGLOBIN A1C: Hgb A1c MFr Bld: 5.9 % (ref 4.6–6.5)

## 2018-06-08 LAB — TSH: TSH: 5.25 u[IU]/mL — ABNORMAL HIGH (ref 0.35–4.50)

## 2018-06-08 MED ORDER — DENOSUMAB 60 MG/ML ~~LOC~~ SOSY
60.0000 mg | PREFILLED_SYRINGE | Freq: Once | SUBCUTANEOUS | Status: AC
  Administered 2018-06-08: 60 mg via SUBCUTANEOUS

## 2018-06-08 MED ORDER — FUROSEMIDE 20 MG PO TABS: 20.0000 mg | ORAL_TABLET | Freq: Every day | ORAL | 1 refills | Status: DC

## 2018-06-08 NOTE — Assessment & Plan Note (Signed)
Continue statin Check lipid panel, CMP Continue healthy diet and walking

## 2018-06-08 NOTE — Assessment & Plan Note (Signed)
BP well controlled Current regimen effective and well tolerated Continue current medications at current doses cmp  

## 2018-06-08 NOTE — Assessment & Plan Note (Signed)
Prolia injection due-given today Continue as much walking as possible-walks with a walker Taking calcium and vitamin D-continue

## 2018-06-08 NOTE — Assessment & Plan Note (Signed)
Clinically euthyroid Check tsh  Titrate med dose if needed  

## 2018-06-08 NOTE — Assessment & Plan Note (Signed)
Check A1c Continue low sugar/carbohydrate diet Continue regular exercise

## 2018-06-08 NOTE — Assessment & Plan Note (Signed)
Controlled Continue Lasix 20 mg daily Continue low-sodium diet

## 2018-06-08 NOTE — Assessment & Plan Note (Signed)
Continue to avoid NSAIDs Continue increased fluids throughout the day CMP

## 2018-06-11 DIAGNOSIS — E669 Obesity, unspecified: Secondary | ICD-10-CM | POA: Diagnosis not present

## 2018-06-12 ENCOUNTER — Telehealth: Payer: Self-pay | Admitting: Internal Medicine

## 2018-06-12 DIAGNOSIS — E669 Obesity, unspecified: Secondary | ICD-10-CM | POA: Diagnosis not present

## 2018-06-12 NOTE — Telephone Encounter (Signed)
Copied from [place] [id number]. Topic: Quick Communication - See Telephone Encounter >> Jun 12, 2018  9:53 AM Ahmed Prima L wrote: CRM for notification. See Telephone encounter for: 06/12/18. Patient states she was in the office on 12/5 and the nurse called and gave her her lab results and advised of message below from lab results  " Cholesterol is very good.  Kidney function is decreased but stable.  Liver tests, blood counts, are normal.  Sugars stable in prediabetic range.  Thyroid function slightly low - have her take 1.5 pills once a week - we can recheck her thyroid function at her next visit "..  Patient states she needs clarification on this. She said is she suppose to take it just one time in a week or everyday? Patient is confused about this. Please contact patient. Thanks

## 2018-06-13 DIAGNOSIS — E669 Obesity, unspecified: Secondary | ICD-10-CM | POA: Diagnosis not present

## 2018-06-13 NOTE — Telephone Encounter (Signed)
Clarified with pt to only take 1.5 one day and 1 tablet the rest of the days of the week. Pt understood. Advised her to call back with any questions or concerns.

## 2018-06-13 NOTE — Telephone Encounter (Signed)
Call ret'd to pt.  Stated she needs clarification about the Thyroid medication.  She wants to be clear on how she is supposed to take this.  Informed pt. That based on recent lab, her Thyroid level is a little low, so the recommendation is to take 1.5 tablet once per week, otherwise she should continue taking 1 tablet daily.  Will send to Dr. Quay Burow to request confirmation on this.  Advised another nurse will call her with further explanation.  Pt. Verb. Understanding.

## 2018-06-14 DIAGNOSIS — E669 Obesity, unspecified: Secondary | ICD-10-CM | POA: Diagnosis not present

## 2018-06-15 DIAGNOSIS — E669 Obesity, unspecified: Secondary | ICD-10-CM | POA: Diagnosis not present

## 2018-06-16 DIAGNOSIS — E669 Obesity, unspecified: Secondary | ICD-10-CM | POA: Diagnosis not present

## 2018-06-17 DIAGNOSIS — E669 Obesity, unspecified: Secondary | ICD-10-CM | POA: Diagnosis not present

## 2018-06-18 DIAGNOSIS — E669 Obesity, unspecified: Secondary | ICD-10-CM | POA: Diagnosis not present

## 2018-06-19 DIAGNOSIS — E669 Obesity, unspecified: Secondary | ICD-10-CM | POA: Diagnosis not present

## 2018-06-20 DIAGNOSIS — E669 Obesity, unspecified: Secondary | ICD-10-CM | POA: Diagnosis not present

## 2018-06-21 DIAGNOSIS — E669 Obesity, unspecified: Secondary | ICD-10-CM | POA: Diagnosis not present

## 2018-06-22 DIAGNOSIS — E669 Obesity, unspecified: Secondary | ICD-10-CM | POA: Diagnosis not present

## 2018-06-22 DIAGNOSIS — J9610 Chronic respiratory failure, unspecified whether with hypoxia or hypercapnia: Secondary | ICD-10-CM | POA: Diagnosis not present

## 2018-06-23 DIAGNOSIS — E669 Obesity, unspecified: Secondary | ICD-10-CM | POA: Diagnosis not present

## 2018-06-24 DIAGNOSIS — E669 Obesity, unspecified: Secondary | ICD-10-CM | POA: Diagnosis not present

## 2018-06-25 DIAGNOSIS — E669 Obesity, unspecified: Secondary | ICD-10-CM | POA: Diagnosis not present

## 2018-06-26 DIAGNOSIS — E669 Obesity, unspecified: Secondary | ICD-10-CM | POA: Diagnosis not present

## 2018-06-27 DIAGNOSIS — E669 Obesity, unspecified: Secondary | ICD-10-CM | POA: Diagnosis not present

## 2018-06-29 DIAGNOSIS — E669 Obesity, unspecified: Secondary | ICD-10-CM | POA: Diagnosis not present

## 2018-06-30 DIAGNOSIS — E669 Obesity, unspecified: Secondary | ICD-10-CM | POA: Diagnosis not present

## 2018-07-01 DIAGNOSIS — E669 Obesity, unspecified: Secondary | ICD-10-CM | POA: Diagnosis not present

## 2018-07-02 DIAGNOSIS — E669 Obesity, unspecified: Secondary | ICD-10-CM | POA: Diagnosis not present

## 2018-07-03 DIAGNOSIS — E669 Obesity, unspecified: Secondary | ICD-10-CM | POA: Diagnosis not present

## 2018-07-07 DIAGNOSIS — E669 Obesity, unspecified: Secondary | ICD-10-CM | POA: Diagnosis not present

## 2018-07-08 DIAGNOSIS — E669 Obesity, unspecified: Secondary | ICD-10-CM | POA: Diagnosis not present

## 2018-07-09 DIAGNOSIS — E669 Obesity, unspecified: Secondary | ICD-10-CM | POA: Diagnosis not present

## 2018-07-10 DIAGNOSIS — E669 Obesity, unspecified: Secondary | ICD-10-CM | POA: Diagnosis not present

## 2018-07-11 DIAGNOSIS — E669 Obesity, unspecified: Secondary | ICD-10-CM | POA: Diagnosis not present

## 2018-07-12 DIAGNOSIS — E669 Obesity, unspecified: Secondary | ICD-10-CM | POA: Diagnosis not present

## 2018-07-13 DIAGNOSIS — E669 Obesity, unspecified: Secondary | ICD-10-CM | POA: Diagnosis not present

## 2018-07-14 DIAGNOSIS — R269 Unspecified abnormalities of gait and mobility: Secondary | ICD-10-CM | POA: Diagnosis not present

## 2018-07-14 DIAGNOSIS — E669 Obesity, unspecified: Secondary | ICD-10-CM | POA: Diagnosis not present

## 2018-07-14 DIAGNOSIS — R32 Unspecified urinary incontinence: Secondary | ICD-10-CM | POA: Diagnosis not present

## 2018-07-15 DIAGNOSIS — E669 Obesity, unspecified: Secondary | ICD-10-CM | POA: Diagnosis not present

## 2018-07-16 DIAGNOSIS — E669 Obesity, unspecified: Secondary | ICD-10-CM | POA: Diagnosis not present

## 2018-07-17 DIAGNOSIS — E669 Obesity, unspecified: Secondary | ICD-10-CM | POA: Diagnosis not present

## 2018-07-18 DIAGNOSIS — E669 Obesity, unspecified: Secondary | ICD-10-CM | POA: Diagnosis not present

## 2018-07-19 DIAGNOSIS — E669 Obesity, unspecified: Secondary | ICD-10-CM | POA: Diagnosis not present

## 2018-07-20 ENCOUNTER — Ambulatory Visit: Payer: Medicare HMO | Admitting: Internal Medicine

## 2018-07-20 DIAGNOSIS — E669 Obesity, unspecified: Secondary | ICD-10-CM | POA: Diagnosis not present

## 2018-07-21 DIAGNOSIS — E669 Obesity, unspecified: Secondary | ICD-10-CM | POA: Diagnosis not present

## 2018-07-22 DIAGNOSIS — E669 Obesity, unspecified: Secondary | ICD-10-CM | POA: Diagnosis not present

## 2018-07-23 DIAGNOSIS — J9610 Chronic respiratory failure, unspecified whether with hypoxia or hypercapnia: Secondary | ICD-10-CM | POA: Diagnosis not present

## 2018-07-30 DIAGNOSIS — E669 Obesity, unspecified: Secondary | ICD-10-CM | POA: Diagnosis not present

## 2018-07-31 DIAGNOSIS — E669 Obesity, unspecified: Secondary | ICD-10-CM | POA: Diagnosis not present

## 2018-08-01 DIAGNOSIS — E669 Obesity, unspecified: Secondary | ICD-10-CM | POA: Diagnosis not present

## 2018-08-02 DIAGNOSIS — E669 Obesity, unspecified: Secondary | ICD-10-CM | POA: Diagnosis not present

## 2018-08-03 DIAGNOSIS — E669 Obesity, unspecified: Secondary | ICD-10-CM | POA: Diagnosis not present

## 2018-08-04 DIAGNOSIS — E669 Obesity, unspecified: Secondary | ICD-10-CM | POA: Diagnosis not present

## 2018-08-05 DIAGNOSIS — E669 Obesity, unspecified: Secondary | ICD-10-CM | POA: Diagnosis not present

## 2018-08-06 DIAGNOSIS — E669 Obesity, unspecified: Secondary | ICD-10-CM | POA: Diagnosis not present

## 2018-08-07 DIAGNOSIS — E669 Obesity, unspecified: Secondary | ICD-10-CM | POA: Diagnosis not present

## 2018-08-08 DIAGNOSIS — E669 Obesity, unspecified: Secondary | ICD-10-CM | POA: Diagnosis not present

## 2018-08-09 DIAGNOSIS — E669 Obesity, unspecified: Secondary | ICD-10-CM | POA: Diagnosis not present

## 2018-08-10 DIAGNOSIS — E669 Obesity, unspecified: Secondary | ICD-10-CM | POA: Diagnosis not present

## 2018-08-11 DIAGNOSIS — E669 Obesity, unspecified: Secondary | ICD-10-CM | POA: Diagnosis not present

## 2018-08-12 DIAGNOSIS — E669 Obesity, unspecified: Secondary | ICD-10-CM | POA: Diagnosis not present

## 2018-08-13 DIAGNOSIS — E669 Obesity, unspecified: Secondary | ICD-10-CM | POA: Diagnosis not present

## 2018-08-14 DIAGNOSIS — R269 Unspecified abnormalities of gait and mobility: Secondary | ICD-10-CM | POA: Diagnosis not present

## 2018-08-14 DIAGNOSIS — E669 Obesity, unspecified: Secondary | ICD-10-CM | POA: Diagnosis not present

## 2018-08-14 DIAGNOSIS — R32 Unspecified urinary incontinence: Secondary | ICD-10-CM | POA: Diagnosis not present

## 2018-08-15 DIAGNOSIS — E669 Obesity, unspecified: Secondary | ICD-10-CM | POA: Diagnosis not present

## 2018-08-16 DIAGNOSIS — E669 Obesity, unspecified: Secondary | ICD-10-CM | POA: Diagnosis not present

## 2018-08-17 DIAGNOSIS — E669 Obesity, unspecified: Secondary | ICD-10-CM | POA: Diagnosis not present

## 2018-08-18 DIAGNOSIS — E669 Obesity, unspecified: Secondary | ICD-10-CM | POA: Diagnosis not present

## 2018-08-19 DIAGNOSIS — E669 Obesity, unspecified: Secondary | ICD-10-CM | POA: Diagnosis not present

## 2018-08-20 DIAGNOSIS — E669 Obesity, unspecified: Secondary | ICD-10-CM | POA: Diagnosis not present

## 2018-08-21 ENCOUNTER — Other Ambulatory Visit: Payer: Self-pay

## 2018-08-21 DIAGNOSIS — E669 Obesity, unspecified: Secondary | ICD-10-CM | POA: Diagnosis not present

## 2018-08-21 MED ORDER — FAMOTIDINE 20 MG PO TABS: ORAL_TABLET | ORAL | 1 refills | Status: DC

## 2018-08-22 DIAGNOSIS — E669 Obesity, unspecified: Secondary | ICD-10-CM | POA: Diagnosis not present

## 2018-08-23 DIAGNOSIS — J9610 Chronic respiratory failure, unspecified whether with hypoxia or hypercapnia: Secondary | ICD-10-CM | POA: Diagnosis not present

## 2018-08-23 DIAGNOSIS — E669 Obesity, unspecified: Secondary | ICD-10-CM | POA: Diagnosis not present

## 2018-08-24 DIAGNOSIS — E669 Obesity, unspecified: Secondary | ICD-10-CM | POA: Diagnosis not present

## 2018-08-25 DIAGNOSIS — E669 Obesity, unspecified: Secondary | ICD-10-CM | POA: Diagnosis not present

## 2018-08-26 DIAGNOSIS — E669 Obesity, unspecified: Secondary | ICD-10-CM | POA: Diagnosis not present

## 2018-08-27 DIAGNOSIS — E669 Obesity, unspecified: Secondary | ICD-10-CM | POA: Diagnosis not present

## 2018-08-28 DIAGNOSIS — E669 Obesity, unspecified: Secondary | ICD-10-CM | POA: Diagnosis not present

## 2018-08-29 DIAGNOSIS — E669 Obesity, unspecified: Secondary | ICD-10-CM | POA: Diagnosis not present

## 2018-08-30 DIAGNOSIS — E669 Obesity, unspecified: Secondary | ICD-10-CM | POA: Diagnosis not present

## 2018-08-31 DIAGNOSIS — E669 Obesity, unspecified: Secondary | ICD-10-CM | POA: Diagnosis not present

## 2018-09-01 DIAGNOSIS — E669 Obesity, unspecified: Secondary | ICD-10-CM | POA: Diagnosis not present

## 2018-09-02 DIAGNOSIS — E669 Obesity, unspecified: Secondary | ICD-10-CM | POA: Diagnosis not present

## 2018-09-03 DIAGNOSIS — E669 Obesity, unspecified: Secondary | ICD-10-CM | POA: Diagnosis not present

## 2018-09-04 DIAGNOSIS — E669 Obesity, unspecified: Secondary | ICD-10-CM | POA: Diagnosis not present

## 2018-09-05 DIAGNOSIS — E669 Obesity, unspecified: Secondary | ICD-10-CM | POA: Diagnosis not present

## 2018-09-06 DIAGNOSIS — E669 Obesity, unspecified: Secondary | ICD-10-CM | POA: Diagnosis not present

## 2018-09-07 DIAGNOSIS — E669 Obesity, unspecified: Secondary | ICD-10-CM | POA: Diagnosis not present

## 2018-09-08 DIAGNOSIS — E669 Obesity, unspecified: Secondary | ICD-10-CM | POA: Diagnosis not present

## 2018-09-09 DIAGNOSIS — E669 Obesity, unspecified: Secondary | ICD-10-CM | POA: Diagnosis not present

## 2018-09-10 DIAGNOSIS — E669 Obesity, unspecified: Secondary | ICD-10-CM | POA: Diagnosis not present

## 2018-09-11 DIAGNOSIS — E669 Obesity, unspecified: Secondary | ICD-10-CM | POA: Diagnosis not present

## 2018-09-12 DIAGNOSIS — E669 Obesity, unspecified: Secondary | ICD-10-CM | POA: Diagnosis not present

## 2018-09-13 DIAGNOSIS — R269 Unspecified abnormalities of gait and mobility: Secondary | ICD-10-CM | POA: Diagnosis not present

## 2018-09-13 DIAGNOSIS — E669 Obesity, unspecified: Secondary | ICD-10-CM | POA: Diagnosis not present

## 2018-09-13 DIAGNOSIS — R32 Unspecified urinary incontinence: Secondary | ICD-10-CM | POA: Diagnosis not present

## 2018-09-14 DIAGNOSIS — E669 Obesity, unspecified: Secondary | ICD-10-CM | POA: Diagnosis not present

## 2018-09-16 DIAGNOSIS — E669 Obesity, unspecified: Secondary | ICD-10-CM | POA: Diagnosis not present

## 2018-09-17 DIAGNOSIS — E669 Obesity, unspecified: Secondary | ICD-10-CM | POA: Diagnosis not present

## 2018-09-18 DIAGNOSIS — E669 Obesity, unspecified: Secondary | ICD-10-CM | POA: Diagnosis not present

## 2018-09-19 DIAGNOSIS — E669 Obesity, unspecified: Secondary | ICD-10-CM | POA: Diagnosis not present

## 2018-09-20 DIAGNOSIS — E669 Obesity, unspecified: Secondary | ICD-10-CM | POA: Diagnosis not present

## 2018-09-21 DIAGNOSIS — J9610 Chronic respiratory failure, unspecified whether with hypoxia or hypercapnia: Secondary | ICD-10-CM | POA: Diagnosis not present

## 2018-09-21 DIAGNOSIS — E669 Obesity, unspecified: Secondary | ICD-10-CM | POA: Diagnosis not present

## 2018-09-22 DIAGNOSIS — E669 Obesity, unspecified: Secondary | ICD-10-CM | POA: Diagnosis not present

## 2018-09-23 DIAGNOSIS — E669 Obesity, unspecified: Secondary | ICD-10-CM | POA: Diagnosis not present

## 2018-10-11 DIAGNOSIS — R32 Unspecified urinary incontinence: Secondary | ICD-10-CM | POA: Diagnosis not present

## 2018-10-11 DIAGNOSIS — R269 Unspecified abnormalities of gait and mobility: Secondary | ICD-10-CM | POA: Diagnosis not present

## 2018-10-13 ENCOUNTER — Other Ambulatory Visit: Payer: Self-pay | Admitting: Internal Medicine

## 2018-10-22 DIAGNOSIS — J9610 Chronic respiratory failure, unspecified whether with hypoxia or hypercapnia: Secondary | ICD-10-CM | POA: Diagnosis not present

## 2018-10-31 ENCOUNTER — Encounter: Payer: Self-pay | Admitting: Adult Health

## 2018-10-31 ENCOUNTER — Ambulatory Visit: Payer: Medicare HMO | Admitting: Internal Medicine

## 2018-10-31 ENCOUNTER — Other Ambulatory Visit: Payer: Self-pay

## 2018-10-31 ENCOUNTER — Ambulatory Visit (INDEPENDENT_AMBULATORY_CARE_PROVIDER_SITE_OTHER): Payer: Medicare HMO | Admitting: Adult Health

## 2018-10-31 DIAGNOSIS — E662 Morbid (severe) obesity with alveolar hypoventilation: Secondary | ICD-10-CM | POA: Diagnosis not present

## 2018-10-31 DIAGNOSIS — G4733 Obstructive sleep apnea (adult) (pediatric): Secondary | ICD-10-CM

## 2018-10-31 NOTE — Progress Notes (Signed)
Virtual Visit via Telephone Note  I connected with Shawna Fuentes on 10/31/18 at  9:00 AM EDT by telephone and verified that I am speaking with the correct person using two identifiers.   I discussed the limitations, risks, security and privacy concerns of performing an evaluation and management service by telephone and the availability of in person appointments. I also discussed with the patient that there may be a patient responsible charge related to this service. The patient expressed understanding and agreed to proceed.   History of Present Illness: Today's televisit is for follow up for OSA/OHS  Patient is present for today's visit at home and myself present at pulmonary office .   85 year old female never smoker followed for obstructive sleep apnea OHS with hypercarbic respiratory failure and chronic right diaphragm elevation on Trilogy NIV mode -(AVAP-AE AVAPS rate 3, Vt 330, PS max 30, min 8, EPAP max 15/ min 4, ramp off. )  Says she wears her machine most every night. Misses an occasional night . Feels rest . Feels pretty good most days.  She has an aide that comes in each day and helps her around the house.  Says chronic leg swelling is doing well with lasix 20mg  daily . Discussed low salt diet .          Observations/Objective: ABG on 2 L oxygen 07/09/2014-pH 7.36, PCO2 52.8, PO2 101, HCO3 29.6 Walk test on room air-08/07/15-held saturation 97% on room air, resting saturation on room air 97%. Walked slowly with a walker 185 feet.(daytime O2 d/c )   ABG on room air 08/07/2015-pH 7.40, PCO2 49, PO2 67.5, HCO3 29.9, saturation 92.1%.  ONO on Trilogy 07/2017 , no sign desats, nocturnal O2 d/c.     Assessment and Plan: OSA/OHS - doing well on nocturnal Trilogy .   Plan  Patient Instructions  Continue on Trilogy Vent At bedtime   Work on healthy weight .  Low salt diet  Follow up  Dr. Annamaria Boots  In 1 year and As needed         Follow Up Instructions: Follow up in 1 year  and As needed      I discussed the assessment and treatment plan with the patient. The patient was provided an opportunity to ask questions and all were answered. The patient agreed with the plan and demonstrated an understanding of the instructions.   The patient was advised to call back or seek an in-person evaluation if the symptoms worsen or if the condition fails to improve as anticipated.  I provided 21 minutes of non-face-to-face time during this encounter.   Rexene Edison, NP

## 2018-10-31 NOTE — Patient Instructions (Addendum)
Continue on Trilogy Vent At bedtime   Work on healthy weight .  Low salt diet  Follow up  Dr. Annamaria Boots  In 1 year and As needed

## 2018-10-31 NOTE — Assessment & Plan Note (Signed)
OHS/OSA -doing well on Trilogy   Plan  Patient Instructions  Continue on Trilogy Vent At bedtime   Work on healthy weight .  Low salt diet  Follow up  Dr. Annamaria Boots  In 1 year and As needed    '

## 2018-11-10 DIAGNOSIS — R32 Unspecified urinary incontinence: Secondary | ICD-10-CM | POA: Diagnosis not present

## 2018-11-10 DIAGNOSIS — R269 Unspecified abnormalities of gait and mobility: Secondary | ICD-10-CM | POA: Diagnosis not present

## 2018-11-19 ENCOUNTER — Other Ambulatory Visit: Payer: Self-pay | Admitting: Internal Medicine

## 2018-11-21 DIAGNOSIS — J9610 Chronic respiratory failure, unspecified whether with hypoxia or hypercapnia: Secondary | ICD-10-CM | POA: Diagnosis not present

## 2018-11-26 ENCOUNTER — Other Ambulatory Visit: Payer: Self-pay | Admitting: Internal Medicine

## 2018-12-03 DIAGNOSIS — E669 Obesity, unspecified: Secondary | ICD-10-CM | POA: Diagnosis not present

## 2018-12-04 DIAGNOSIS — E669 Obesity, unspecified: Secondary | ICD-10-CM | POA: Diagnosis not present

## 2018-12-05 DIAGNOSIS — E669 Obesity, unspecified: Secondary | ICD-10-CM | POA: Diagnosis not present

## 2018-12-06 DIAGNOSIS — E669 Obesity, unspecified: Secondary | ICD-10-CM | POA: Diagnosis not present

## 2018-12-07 DIAGNOSIS — E669 Obesity, unspecified: Secondary | ICD-10-CM | POA: Diagnosis not present

## 2018-12-08 DIAGNOSIS — E669 Obesity, unspecified: Secondary | ICD-10-CM | POA: Diagnosis not present

## 2018-12-10 DIAGNOSIS — E669 Obesity, unspecified: Secondary | ICD-10-CM | POA: Diagnosis not present

## 2018-12-11 DIAGNOSIS — E669 Obesity, unspecified: Secondary | ICD-10-CM | POA: Diagnosis not present

## 2018-12-11 DIAGNOSIS — R269 Unspecified abnormalities of gait and mobility: Secondary | ICD-10-CM | POA: Diagnosis not present

## 2018-12-11 DIAGNOSIS — R32 Unspecified urinary incontinence: Secondary | ICD-10-CM | POA: Diagnosis not present

## 2018-12-11 NOTE — Progress Notes (Signed)
Virtual Visit via Video Note  I connected with Shawna Fuentes on 12/12/18 at  9:00 AM EDT by a video enabled telemedicine application and verified that I am speaking with the correct person using two identifiers.   I discussed the limitations of evaluation and management by telemedicine and the availability of in person appointments. The patient expressed understanding and agreed to proceed.  The patient is currently at home and I am in the office.    No referring provider.    History of Present Illness: She is here for follow up of her chronic medical conditions.   She is exercising - walking in her house, and outside.    Hypertension leg edema: She is taking her medication daily. She is compliant with a low sodium diet.  Her leg edema is stable.  She denies chest pain, palpitations, shortness of breath and regular headaches. She does monitor her blood pressure at home and it has been well controlled.    Osteoporosis:  She is getting prolia Q 6 months.  She takes calcium and vitamin d.  She does some walking with her walker.   Hyperlipidemia: She is taking her medication daily. She is compliant with a low fat/cholesterol diet. She denies myalgias.   Hypothyroidism:  She is taking her medication daily.  She denies any recent changes in energy or weight that are unexplained.   Prediabetes:  She is compliant with a low sugar/carbohydrate diet.  She is exercising regularly.  CKD: She does not take any NSAIDs.  She drinks water during the day.   Review of Systems  Constitutional: Negative for chills and fever.  Respiratory: Negative for cough, shortness of breath and wheezing.   Cardiovascular: Positive for leg swelling (controlled). Negative for chest pain and palpitations.  Neurological: Negative for dizziness and headaches.     Social History   Socioeconomic History  . Marital status: Widowed    Spouse name: Not on file  . Number of children: 5  . Years of education: Not on  file  . Highest education level: Not on file  Occupational History  . Occupation: retired  Scientific laboratory technician  . Financial resource strain: Not hard at all  . Food insecurity:    Worry: Never true    Inability: Never true  . Transportation needs:    Medical: No    Non-medical: No  Tobacco Use  . Smoking status: Never Smoker  . Smokeless tobacco: Never Used  . Tobacco comment: lives w/ dtr dorthea, 2nd dtr and 3 sons in town  Substance and Sexual Activity  . Alcohol use: No    Alcohol/week: 0.0 standard drinks  . Drug use: No  . Sexual activity: Not Currently  Lifestyle  . Physical activity:    Days per week: 0 days    Minutes per session: 0 min  . Stress: Not at all  Relationships  . Social connections:    Talks on phone: More than three times a week    Gets together: More than three times a week    Attends religious service: More than 4 times per year    Active member of club or organization: Yes    Attends meetings of clubs or organizations: More than 4 times per year    Relationship status: Widowed  Other Topics Concern  . 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   Lives with daughter  dorthea - has 3 sons and another daughter in town, supportive     Observations/Objective: Appears well in NAD    Assessment and Plan:  See Problem List for Assessment and Plan of chronic medical problems.   Follow Up Instructions:    I discussed the assessment and treatment plan with the patient. The patient was provided an opportunity to ask questions and all were answered. The patient agreed with the plan and demonstrated an understanding of the instructions.   The patient was advised to call back or seek an in-person evaluation if the symptoms worsen or if the condition fails to improve as anticipated.  Follow-up in 6 months-we will do blood work at that time due to coronavirus  pandemic  Binnie Rail, MD

## 2018-12-12 ENCOUNTER — Encounter: Payer: Self-pay | Admitting: Internal Medicine

## 2018-12-12 ENCOUNTER — Ambulatory Visit (INDEPENDENT_AMBULATORY_CARE_PROVIDER_SITE_OTHER): Payer: Medicare HMO | Admitting: Internal Medicine

## 2018-12-12 DIAGNOSIS — M810 Age-related osteoporosis without current pathological fracture: Secondary | ICD-10-CM | POA: Diagnosis not present

## 2018-12-12 DIAGNOSIS — E7849 Other hyperlipidemia: Secondary | ICD-10-CM | POA: Diagnosis not present

## 2018-12-12 DIAGNOSIS — R7303 Prediabetes: Secondary | ICD-10-CM

## 2018-12-12 DIAGNOSIS — E669 Obesity, unspecified: Secondary | ICD-10-CM | POA: Diagnosis not present

## 2018-12-12 DIAGNOSIS — R600 Localized edema: Secondary | ICD-10-CM | POA: Diagnosis not present

## 2018-12-12 DIAGNOSIS — N183 Chronic kidney disease, stage 3 unspecified: Secondary | ICD-10-CM

## 2018-12-12 DIAGNOSIS — E890 Postprocedural hypothyroidism: Secondary | ICD-10-CM | POA: Diagnosis not present

## 2018-12-12 DIAGNOSIS — I10 Essential (primary) hypertension: Secondary | ICD-10-CM | POA: Diagnosis not present

## 2018-12-12 MED ORDER — FUROSEMIDE 20 MG PO TABS: 20.0000 mg | ORAL_TABLET | Freq: Every day | ORAL | 1 refills | Status: AC

## 2018-12-12 MED ORDER — LEVOTHYROXINE SODIUM 100 MCG PO TABS: ORAL_TABLET | ORAL | 1 refills | Status: AC

## 2018-12-12 MED ORDER — AMLODIPINE BESYLATE 2.5 MG PO TABS: 2.5000 mg | ORAL_TABLET | Freq: Every day | ORAL | 1 refills | Status: AC

## 2018-12-12 NOTE — Assessment & Plan Note (Signed)
Controlled, stable Continue current dose of lasix

## 2018-12-12 NOTE — Assessment & Plan Note (Signed)
Continue walking Continue low sugar/carb diet

## 2018-12-12 NOTE — Assessment & Plan Note (Signed)
BP Readings from Last 3 Encounters:  06/08/18 132/82  03/13/18 (!) 158/86  01/17/18 134/72    BP well controlled Current regimen effective and well tolerated Continue current medications at current doses

## 2018-12-12 NOTE — Assessment & Plan Note (Signed)
Has remained stable  Recheck cmp in 6 months

## 2018-12-12 NOTE — Assessment & Plan Note (Signed)
prolia Q 6 months Continue walking Continue calcium and vitamin d

## 2018-12-12 NOTE — Assessment & Plan Note (Signed)
Continue statin. 

## 2018-12-12 NOTE — Assessment & Plan Note (Signed)
Clinically euthyroid Continue current dose of medication  

## 2018-12-13 DIAGNOSIS — E669 Obesity, unspecified: Secondary | ICD-10-CM | POA: Diagnosis not present

## 2018-12-14 DIAGNOSIS — E669 Obesity, unspecified: Secondary | ICD-10-CM | POA: Diagnosis not present

## 2018-12-15 DIAGNOSIS — E669 Obesity, unspecified: Secondary | ICD-10-CM | POA: Diagnosis not present

## 2018-12-16 DIAGNOSIS — E669 Obesity, unspecified: Secondary | ICD-10-CM | POA: Diagnosis not present

## 2018-12-21 ENCOUNTER — Telehealth: Payer: Self-pay

## 2018-12-21 MED ORDER — DENOSUMAB 60 MG/ML ~~LOC~~ SOSY: 60.0000 mg | PREFILLED_SYRINGE | Freq: Once | SUBCUTANEOUS | 0 refills | Status: AC

## 2018-12-21 NOTE — Telephone Encounter (Signed)
error 

## 2018-12-22 DIAGNOSIS — J9610 Chronic respiratory failure, unspecified whether with hypoxia or hypercapnia: Secondary | ICD-10-CM | POA: Diagnosis not present

## 2018-12-24 DIAGNOSIS — E669 Obesity, unspecified: Secondary | ICD-10-CM | POA: Diagnosis not present

## 2018-12-25 ENCOUNTER — Ambulatory Visit: Payer: Medicare HMO

## 2018-12-26 DIAGNOSIS — E669 Obesity, unspecified: Secondary | ICD-10-CM | POA: Diagnosis not present

## 2018-12-27 DIAGNOSIS — E669 Obesity, unspecified: Secondary | ICD-10-CM | POA: Diagnosis not present

## 2018-12-28 DIAGNOSIS — E669 Obesity, unspecified: Secondary | ICD-10-CM | POA: Diagnosis not present

## 2018-12-29 ENCOUNTER — Telehealth: Payer: Self-pay

## 2018-12-29 DIAGNOSIS — E669 Obesity, unspecified: Secondary | ICD-10-CM | POA: Diagnosis not present

## 2018-12-29 NOTE — Telephone Encounter (Signed)
Copied from [place] [id number]. Topic: General - Inquiry >> Dec 29, 2018  9:55 AM Richardo Priest, NT wrote: Reason for CRM: Patient calling in checking on status of getting denosumab (PROLIA) 60 MG/ML SOSY injection. Please call back [phone]. >> Dec 29, 2018 10:43 AM Morphies, Isidoro Donning wrote: Called patient to schedule. She states that the pharmacy told her they were sending the Prolia to our office. Please advise.   Advised patient I would call her when I get injection here in office so that we can schedule a nurse visit for administration

## 2018-12-31 DIAGNOSIS — E669 Obesity, unspecified: Secondary | ICD-10-CM | POA: Diagnosis not present

## 2019-01-01 DIAGNOSIS — E669 Obesity, unspecified: Secondary | ICD-10-CM | POA: Diagnosis not present

## 2019-01-02 DIAGNOSIS — E669 Obesity, unspecified: Secondary | ICD-10-CM | POA: Diagnosis not present

## 2019-01-03 DIAGNOSIS — E669 Obesity, unspecified: Secondary | ICD-10-CM | POA: Diagnosis not present

## 2019-01-04 DIAGNOSIS — E669 Obesity, unspecified: Secondary | ICD-10-CM | POA: Diagnosis not present

## 2019-01-05 DIAGNOSIS — E669 Obesity, unspecified: Secondary | ICD-10-CM | POA: Diagnosis not present

## 2019-01-08 DIAGNOSIS — E669 Obesity, unspecified: Secondary | ICD-10-CM | POA: Diagnosis not present

## 2019-01-09 DIAGNOSIS — R269 Unspecified abnormalities of gait and mobility: Secondary | ICD-10-CM | POA: Diagnosis not present

## 2019-01-09 DIAGNOSIS — E669 Obesity, unspecified: Secondary | ICD-10-CM | POA: Diagnosis not present

## 2019-01-09 DIAGNOSIS — R32 Unspecified urinary incontinence: Secondary | ICD-10-CM | POA: Diagnosis not present

## 2019-01-10 DIAGNOSIS — E669 Obesity, unspecified: Secondary | ICD-10-CM | POA: Diagnosis not present

## 2019-01-11 DIAGNOSIS — E669 Obesity, unspecified: Secondary | ICD-10-CM | POA: Diagnosis not present

## 2019-01-11 NOTE — Telephone Encounter (Signed)
Patient's daughter called to follow up.  Have we received her Prolia yet?

## 2019-01-11 NOTE — Telephone Encounter (Signed)
Advised patient I still have NOT recd prolia injection from specialty pharmacy, I am [facility]axing prolia rx [facility] today [facility]

## 2019-01-12 DIAGNOSIS — E669 Obesity, unspecified: Secondary | ICD-10-CM | POA: Diagnosis not present

## 2019-01-15 ENCOUNTER — Other Ambulatory Visit: Payer: Self-pay | Admitting: Internal Medicine

## 2019-01-16 ENCOUNTER — Telehealth: Payer: Self-pay | Admitting: Internal Medicine

## 2019-01-16 ENCOUNTER — Telehealth: Payer: Self-pay

## 2019-01-16 MED ORDER — DENOSUMAB 60 MG/ML ~~LOC~~ SOSY: 60.0000 mg | PREFILLED_SYRINGE | Freq: Once | SUBCUTANEOUS | 0 refills | Status: AC

## 2019-01-16 NOTE — Telephone Encounter (Signed)
Sending new rx today---patient advised---will call patient when rx arrives in our office for her to come in to get in office

## 2019-01-16 NOTE — Telephone Encounter (Signed)
Copied from [place] [id number]. Topic: General - Other >> Jan 16, 2019  8:54 AM Keene Breath wrote: Reason for CRM: [facility] called to request a new prescription for Prolia, since the current prescription is over a year old.  Please advise and call with any questions at [phone]

## 2019-01-16 NOTE — Telephone Encounter (Signed)
Copied from [place] [id number]. Topic: General - Inquiry >> Jan 16, 2019  9:05 AM Mathis Bud wrote: Reason for CRM: patient is calling to speak with tammy regarding her Prolia shot. Patient feels like she has been getting the run around and would just like to speak with Tammy. Call back [phone]

## 2019-01-21 DIAGNOSIS — J9610 Chronic respiratory failure, unspecified whether with hypoxia or hypercapnia: Secondary | ICD-10-CM | POA: Diagnosis not present

## 2019-01-23 DIAGNOSIS — N179 Acute kidney failure, unspecified: Secondary | ICD-10-CM | POA: Diagnosis not present

## 2019-01-23 DIAGNOSIS — M810 Age-related osteoporosis without current pathological fracture: Secondary | ICD-10-CM | POA: Diagnosis not present

## 2019-01-23 DIAGNOSIS — K219 Gastro-esophageal reflux disease without esophagitis: Secondary | ICD-10-CM | POA: Diagnosis not present

## 2019-01-23 DIAGNOSIS — Z6841 Body Mass Index (BMI) 40.0 and over, adult: Secondary | ICD-10-CM | POA: Diagnosis not present

## 2019-01-23 DIAGNOSIS — R609 Edema, unspecified: Secondary | ICD-10-CM | POA: Diagnosis not present

## 2019-01-23 DIAGNOSIS — N189 Chronic kidney disease, unspecified: Secondary | ICD-10-CM | POA: Diagnosis not present

## 2019-01-23 DIAGNOSIS — I129 Hypertensive chronic kidney disease with stage 1 through stage 4 chronic kidney disease, or unspecified chronic kidney disease: Secondary | ICD-10-CM | POA: Diagnosis not present

## 2019-01-24 ENCOUNTER — Telehealth: Payer: Self-pay | Admitting: Internal Medicine

## 2019-01-24 NOTE — Telephone Encounter (Signed)
Copied from [place] [id number]. Topic: General - Inquiry >> Jan 16, 2019  9:05 AM Mathis Bud wrote: Reason for CRM: patient is calling to speak with tammy regarding her Prolia shot. Patient feels like she has been getting the run around and would just like to speak with Tammy. Call back [phone]

## 2019-01-24 NOTE — Telephone Encounter (Signed)
Patient is requesting a call after the meeting Jonelle Sidle has with the insurance company for the appeal.  Just to keep her updated.

## 2019-02-05 ENCOUNTER — Other Ambulatory Visit: Payer: Self-pay

## 2019-02-05 MED ORDER — DENOSUMAB 60 MG/ML ~~LOC~~ SOSY: 60.0000 mg | PREFILLED_SYRINGE | Freq: Once | SUBCUTANEOUS | 0 refills | Status: AC

## 2019-02-09 DIAGNOSIS — R269 Unspecified abnormalities of gait and mobility: Secondary | ICD-10-CM | POA: Diagnosis not present

## 2019-02-09 DIAGNOSIS — R32 Unspecified urinary incontinence: Secondary | ICD-10-CM | POA: Diagnosis not present

## 2019-02-11 ENCOUNTER — Other Ambulatory Visit: Payer: Self-pay | Admitting: Internal Medicine

## 2019-02-15 NOTE — Telephone Encounter (Signed)
prolia injection has arrived to my office with patient name labeled on box---placed in [facility] refrig---patient has scheduled nurse visit for 8/19----need to document that "patient supplied med"---to do in parking lot

## 2019-02-19 ENCOUNTER — Ambulatory Visit (INDEPENDENT_AMBULATORY_CARE_PROVIDER_SITE_OTHER): Payer: Medicare HMO

## 2019-02-19 ENCOUNTER — Other Ambulatory Visit: Payer: Self-pay

## 2019-02-19 DIAGNOSIS — M810 Age-related osteoporosis without current pathological fracture: Secondary | ICD-10-CM | POA: Diagnosis not present

## 2019-02-19 MED ORDER — DENOSUMAB 60 MG/ML ~~LOC~~ SOSY
60.0000 mg | PREFILLED_SYRINGE | Freq: Once | SUBCUTANEOUS | Status: AC
  Administered 2019-02-19: 60 mg via SUBCUTANEOUS

## 2019-02-19 NOTE — Progress Notes (Signed)
prolia Injection given.   Shawna Fuentes Shawna Leanne Sisler, MD  

## 2019-02-21 ENCOUNTER — Ambulatory Visit: Payer: Medicare HMO

## 2019-02-21 DIAGNOSIS — J9610 Chronic respiratory failure, unspecified whether with hypoxia or hypercapnia: Secondary | ICD-10-CM | POA: Diagnosis not present

## 2019-03-01 ENCOUNTER — Ambulatory Visit (INDEPENDENT_AMBULATORY_CARE_PROVIDER_SITE_OTHER): Payer: Medicare HMO | Admitting: Internal Medicine

## 2019-03-01 ENCOUNTER — Encounter: Payer: Self-pay | Admitting: Internal Medicine

## 2019-03-01 DIAGNOSIS — R600 Localized edema: Secondary | ICD-10-CM | POA: Diagnosis not present

## 2019-03-01 MED ORDER — POTASSIUM CHLORIDE CRYS ER 20 MEQ PO TBCR: 20.0000 meq | EXTENDED_RELEASE_TABLET | Freq: Every day | ORAL | 0 refills | Status: AC

## 2019-03-01 NOTE — Assessment & Plan Note (Addendum)
Chronic bilateral lower extremity edema that has been worse for 1 week No obvious cause, possibly related to increased salt intake No symptoms to suggest CHF or cellulitis Continue to elevate We will increase Lasix to 40 mg daily x3 days and then return to 20 mg daily We will prescribe potassium for 3 days as well since her potassium is on the low-normal range Advised her to let me know if her swelling does not improve or if she has any concerning redness or pain in the legs Low-sodium diet

## 2019-03-01 NOTE — Progress Notes (Signed)
Virtual Visit via Video Note  I connected with Shawna Fuentes on 03/01/19 at 10:00 AM EDT by a video enabled telemedicine application and verified that I am speaking with the correct person using two identifiers.   I discussed the limitations of evaluation and management by telemedicine and the availability of in person appointments. The patient expressed understanding and agreed to proceed.  The patient is currently at home and I am in the office.  She has a family member know with her, but she is able to provide the history herself.  No referring provider.    History of Present Illness: This is an acute visit for b/l leg edema.  She is a chronic bilateral lower extremity edema, but for the past week has been worse.  She is unsure why the swelling has been worse.  Her lower legs and feet are swollen bilaterally.  She denies any change in her salt intake, but did mention that she ate some food that her son brought over so is unsure.  She does elevate her legs during the day-she sits in a recliner.  She denies any obvious redness or pain, but her posterior legs are a little sore.  She has not had any fevers, chills, shortness of breath, chest pain or palpitations.  She does take Lasix 20 mg daily and has been taking that regularly.  She does have a history of cellulitis.  Review of Systems  Constitutional: Negative for fever.  Respiratory: Positive for shortness of breath (sometimes with exertion - no change). Negative for cough and wheezing.   Cardiovascular: Positive for leg swelling. Negative for chest pain and palpitations.  Neurological: Negative for dizziness and headaches.      Social History   Socioeconomic History  . Marital status: Widowed    Spouse name: Not on file  . Number of children: 5  . Years of education: Not on file  . Highest education level: Not on file  Occupational History  . Occupation: retired  Scientific laboratory technician  . Financial resource strain: Not hard at all   . Food insecurity    Worry: Never true    Inability: Never true  . Transportation needs    Medical: No    Non-medical: No  Tobacco Use  . Smoking status: Never Smoker  . Smokeless tobacco: Never Used  . Tobacco comment: lives w/ dtr dorthea, 2nd dtr and 3 sons in town  Substance and Sexual Activity  . Alcohol use: No    Alcohol/week: 0.0 standard drinks  . Drug use: No  . Sexual activity: Not Currently  Lifestyle  . Physical activity    Days per week: 0 days    Minutes per session: 0 min  . Stress: Not at all  Relationships  . Social connections    Talks on phone: More than three times a week    Gets together: More than three times a week    Attends religious service: More than 4 times per year    Active member of club or organization: Yes    Attends meetings of clubs or organizations: More than 4 times per year    Relationship status: Widowed  Other Topics Concern  . 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   Lives with daughter dorthea - has 3 sons and another daughter in town, supportive     Observations/Objective: Appears well in NAD Breathing  normally Bilateral lower legs and feet puffy.  No obvious redness or skin discoloration, but difficult to see well due to the poor quality of the video No obvious breaks in skin  Assessment and Plan:  See Problem List for Assessment and Plan of chronic medical problems.   Follow Up Instructions:    I discussed the assessment and treatment plan with the patient. The patient was provided an opportunity to ask questions and all were answered. The patient agreed with the plan and demonstrated an understanding of the instructions.   The patient was advised to call back or seek an in-person evaluation if the symptoms worsen or if the condition fails to improve as anticipated.    Binnie Rail, MD

## 2019-03-09 ENCOUNTER — Telehealth: Payer: Self-pay | Admitting: Internal Medicine

## 2019-03-09 NOTE — Telephone Encounter (Signed)
She needs to be seen- if she doesn't want to come in today, we can increase the Lasix back to 40 mg daily until she can see Dr. Quay Burow next week.

## 2019-03-09 NOTE — Telephone Encounter (Signed)
noted 

## 2019-03-09 NOTE — Telephone Encounter (Signed)
Copied from [place] [phone]. Topic: General - Other >> Mar 09, 2019  9:38 AM Rainey Pines A wrote: Patients fluid retention getting worse after patient was advised to double up on meds for 3 days. Patient health aid Samone called to inform Dr. Quay Burow of this and would like a  call back [phone]

## 2019-03-09 NOTE — Telephone Encounter (Signed)
Routing to laura, patient virtually seen by dr burns on 8/27, lasix increased at that time---do you want another visit with patient in the absence of dr burns, please advise, thanks

## 2019-03-09 NOTE — Telephone Encounter (Signed)
Patient's son has had a massive heart attack--she can't come in today, but did schedule appt with dr burns for Tuesday 03/13/19----I have advised to increase lasix dosage back to 40mg  daily (like dr burns did at last 8/27 visit) until she is seen by dr burns on Tuesday-----I have sent another lasix rx to pharmacy so that patient has enough pills to last until seen by dr burns on Tuesday, routing to dr burns, fyi...  Patient states she has no cough, no chest pain, no wheezing, no out of the ordinary shortness of breath---advised if these symptoms develop, go to ED---patient repeated all instructions back for understanding

## 2019-03-11 ENCOUNTER — Encounter (HOSPITAL_COMMUNITY): Payer: Self-pay

## 2019-03-11 ENCOUNTER — Emergency Department (HOSPITAL_COMMUNITY): Payer: Medicare HMO

## 2019-03-11 ENCOUNTER — Other Ambulatory Visit: Payer: Self-pay

## 2019-03-11 ENCOUNTER — Inpatient Hospital Stay (HOSPITAL_COMMUNITY)
Admission: EM | Admit: 2019-03-11 | Discharge: 2019-03-18 | DRG: 435 | Disposition: A | Discharge status: Hospice | Payer: Medicare HMO | Attending: Internal Medicine | Admitting: Internal Medicine

## 2019-03-11 DIAGNOSIS — R5381 Other malaise: Secondary | ICD-10-CM | POA: Diagnosis not present

## 2019-03-11 DIAGNOSIS — N189 Chronic kidney disease, unspecified: Secondary | ICD-10-CM | POA: Diagnosis not present

## 2019-03-11 DIAGNOSIS — G4733 Obstructive sleep apnea (adult) (pediatric): Secondary | ICD-10-CM | POA: Diagnosis present

## 2019-03-11 DIAGNOSIS — Z885 Allergy status to narcotic agent status: Secondary | ICD-10-CM | POA: Diagnosis not present

## 2019-03-11 DIAGNOSIS — C801 Malignant (primary) neoplasm, unspecified: Secondary | ICD-10-CM | POA: Diagnosis not present

## 2019-03-11 DIAGNOSIS — E860 Dehydration: Secondary | ICD-10-CM | POA: Diagnosis present

## 2019-03-11 DIAGNOSIS — Z6841 Body Mass Index (BMI) 40.0 and over, adult: Secondary | ICD-10-CM

## 2019-03-11 DIAGNOSIS — D631 Anemia in chronic kidney disease: Secondary | ICD-10-CM | POA: Diagnosis present

## 2019-03-11 DIAGNOSIS — Z79890 Hormone replacement therapy (postmenopausal): Secondary | ICD-10-CM | POA: Diagnosis not present

## 2019-03-11 DIAGNOSIS — N179 Acute kidney failure, unspecified: Secondary | ICD-10-CM

## 2019-03-11 DIAGNOSIS — M810 Age-related osteoporosis without current pathological fracture: Secondary | ICD-10-CM | POA: Diagnosis present

## 2019-03-11 DIAGNOSIS — Z7401 Bed confinement status: Secondary | ICD-10-CM | POA: Diagnosis not present

## 2019-03-11 DIAGNOSIS — K7290 Hepatic failure, unspecified without coma: Secondary | ICD-10-CM | POA: Diagnosis present

## 2019-03-11 DIAGNOSIS — E7220 Disorder of urea cycle metabolism, unspecified: Secondary | ICD-10-CM | POA: Diagnosis not present

## 2019-03-11 DIAGNOSIS — Z515 Encounter for palliative care: Secondary | ICD-10-CM | POA: Diagnosis not present

## 2019-03-11 DIAGNOSIS — Z887 Allergy status to serum and vaccine status: Secondary | ICD-10-CM

## 2019-03-11 DIAGNOSIS — Z20828 Contact with and (suspected) exposure to other viral communicable diseases: Secondary | ICD-10-CM | POA: Diagnosis present

## 2019-03-11 DIAGNOSIS — D689 Coagulation defect, unspecified: Secondary | ICD-10-CM | POA: Diagnosis not present

## 2019-03-11 DIAGNOSIS — R1310 Dysphagia, unspecified: Secondary | ICD-10-CM

## 2019-03-11 DIAGNOSIS — E890 Postprocedural hypothyroidism: Secondary | ICD-10-CM | POA: Diagnosis present

## 2019-03-11 DIAGNOSIS — E785 Hyperlipidemia, unspecified: Secondary | ICD-10-CM | POA: Diagnosis present

## 2019-03-11 DIAGNOSIS — C787 Secondary malignant neoplasm of liver and intrahepatic bile duct: Principal | ICD-10-CM | POA: Diagnosis present

## 2019-03-11 DIAGNOSIS — I129 Hypertensive chronic kidney disease with stage 1 through stage 4 chronic kidney disease, or unspecified chronic kidney disease: Secondary | ICD-10-CM | POA: Diagnosis present

## 2019-03-11 DIAGNOSIS — J9612 Chronic respiratory failure with hypercapnia: Secondary | ICD-10-CM | POA: Diagnosis present

## 2019-03-11 DIAGNOSIS — R945 Abnormal results of liver function studies: Secondary | ICD-10-CM | POA: Diagnosis not present

## 2019-03-11 DIAGNOSIS — Z66 Do not resuscitate: Secondary | ICD-10-CM | POA: Diagnosis not present

## 2019-03-11 DIAGNOSIS — K222 Esophageal obstruction: Secondary | ICD-10-CM | POA: Diagnosis not present

## 2019-03-11 DIAGNOSIS — K767 Hepatorenal syndrome: Secondary | ICD-10-CM | POA: Diagnosis present

## 2019-03-11 DIAGNOSIS — Z833 Family history of diabetes mellitus: Secondary | ICD-10-CM

## 2019-03-11 DIAGNOSIS — M2550 Pain in unspecified joint: Secondary | ICD-10-CM | POA: Diagnosis not present

## 2019-03-11 DIAGNOSIS — Z8249 Family history of ischemic heart disease and other diseases of the circulatory system: Secondary | ICD-10-CM | POA: Diagnosis not present

## 2019-03-11 DIAGNOSIS — N183 Chronic kidney disease, stage 3 unspecified: Secondary | ICD-10-CM | POA: Diagnosis present

## 2019-03-11 DIAGNOSIS — R7989 Other specified abnormal findings of blood chemistry: Secondary | ICD-10-CM

## 2019-03-11 DIAGNOSIS — I10 Essential (primary) hypertension: Secondary | ICD-10-CM | POA: Diagnosis not present

## 2019-03-11 DIAGNOSIS — K5792 Diverticulitis of intestine, part unspecified, without perforation or abscess without bleeding: Secondary | ICD-10-CM | POA: Diagnosis present

## 2019-03-11 DIAGNOSIS — R52 Pain, unspecified: Secondary | ICD-10-CM | POA: Diagnosis not present

## 2019-03-11 DIAGNOSIS — R17 Unspecified jaundice: Secondary | ICD-10-CM

## 2019-03-11 DIAGNOSIS — K7460 Unspecified cirrhosis of liver: Secondary | ICD-10-CM | POA: Diagnosis present

## 2019-03-11 DIAGNOSIS — R42 Dizziness and giddiness: Secondary | ICD-10-CM | POA: Diagnosis not present

## 2019-03-11 DIAGNOSIS — K8020 Calculus of gallbladder without cholecystitis without obstruction: Secondary | ICD-10-CM | POA: Diagnosis not present

## 2019-03-11 DIAGNOSIS — Z809 Family history of malignant neoplasm, unspecified: Secondary | ICD-10-CM

## 2019-03-11 DIAGNOSIS — Z7982 Long term (current) use of aspirin: Secondary | ICD-10-CM | POA: Diagnosis not present

## 2019-03-11 DIAGNOSIS — D649 Anemia, unspecified: Secondary | ICD-10-CM | POA: Diagnosis present

## 2019-03-11 DIAGNOSIS — K5732 Diverticulitis of large intestine without perforation or abscess without bleeding: Secondary | ICD-10-CM | POA: Diagnosis present

## 2019-03-11 DIAGNOSIS — E8809 Other disorders of plasma-protein metabolism, not elsewhere classified: Secondary | ICD-10-CM

## 2019-03-11 DIAGNOSIS — K224 Dyskinesia of esophagus: Secondary | ICD-10-CM | POA: Diagnosis not present

## 2019-03-11 DIAGNOSIS — K3189 Other diseases of stomach and duodenum: Secondary | ICD-10-CM | POA: Diagnosis not present

## 2019-03-11 DIAGNOSIS — M1990 Unspecified osteoarthritis, unspecified site: Secondary | ICD-10-CM | POA: Diagnosis present

## 2019-03-11 DIAGNOSIS — K317 Polyp of stomach and duodenum: Secondary | ICD-10-CM | POA: Diagnosis present

## 2019-03-11 DIAGNOSIS — Z86718 Personal history of other venous thrombosis and embolism: Secondary | ICD-10-CM

## 2019-03-11 DIAGNOSIS — R0689 Other abnormalities of breathing: Secondary | ICD-10-CM | POA: Diagnosis not present

## 2019-03-11 DIAGNOSIS — J9811 Atelectasis: Secondary | ICD-10-CM | POA: Diagnosis not present

## 2019-03-11 DIAGNOSIS — R0602 Shortness of breath: Secondary | ICD-10-CM | POA: Diagnosis present

## 2019-03-11 DIAGNOSIS — I959 Hypotension, unspecified: Secondary | ICD-10-CM | POA: Diagnosis not present

## 2019-03-11 DIAGNOSIS — I499 Cardiac arrhythmia, unspecified: Secondary | ICD-10-CM | POA: Diagnosis not present

## 2019-03-11 DIAGNOSIS — R748 Abnormal levels of other serum enzymes: Secondary | ICD-10-CM | POA: Diagnosis not present

## 2019-03-11 DIAGNOSIS — R109 Unspecified abdominal pain: Secondary | ICD-10-CM | POA: Diagnosis not present

## 2019-03-11 DIAGNOSIS — R609 Edema, unspecified: Secondary | ICD-10-CM | POA: Diagnosis present

## 2019-03-11 LAB — PROTIME-INR
INR: 1.5 — ABNORMAL HIGH (ref 0.8–1.2)
Prothrombin Time: 18.3 seconds — ABNORMAL HIGH (ref 11.4–15.2)

## 2019-03-11 LAB — FOLATE: Folate: 7 ng/mL (ref >5.9)

## 2019-03-11 LAB — COMPREHENSIVE METABOLIC PANEL
ALT: 43 U/L (ref 0–44)
AST: 106 U/L — ABNORMAL HIGH (ref 15–41)
Albumin: 2.3 g/dL — ABNORMAL LOW (ref 3.5–5.0)
Alkaline Phosphatase: 542 U/L — ABNORMAL HIGH (ref 38–126)
Anion gap: 10 (ref 5–15)
BUN: 39 mg/dL — ABNORMAL HIGH (ref 8–23)
CO2: 18 mmol/L — ABNORMAL LOW (ref 22–32)
Calcium: 8 mg/dL — ABNORMAL LOW (ref 8.9–10.3)
Chloride: 102 mmol/L (ref 98–111)
Creatinine, Ser: 2.13 mg/dL — ABNORMAL HIGH (ref 0.44–1.00)
GFR calc Af Amer: 24 mL/min — ABNORMAL LOW (ref >60)
GFR calc non Af Amer: 21 mL/min — ABNORMAL LOW (ref >60)
Glucose, Bld: 98 mg/dL (ref 70–99)
Potassium: 4.9 mmol/L (ref 3.5–5.1)
Sodium: 130 mmol/L — ABNORMAL LOW (ref 135–145)
Total Bilirubin: 8 mg/dL — ABNORMAL HIGH (ref 0.3–1.2)
Total Protein: 6.4 g/dL — ABNORMAL LOW (ref 6.5–8.1)

## 2019-03-11 LAB — CBC WITH DIFFERENTIAL/PLATELET
Abs Immature Granulocytes: 0.12 10*3/uL — ABNORMAL HIGH (ref 0.00–0.07)
Basophils Absolute: 0 10*3/uL (ref 0.0–0.1)
Basophils Relative: 0 %
Eosinophils Absolute: 0 10*3/uL (ref 0.0–0.5)
Eosinophils Relative: 0 %
HCT: 25.9 % — ABNORMAL LOW (ref 36.0–46.0)
Hemoglobin: 8.2 g/dL — ABNORMAL LOW (ref 12.0–15.0)
Immature Granulocytes: 1 %
Lymphocytes Relative: 11 %
Lymphs Abs: 1.3 10*3/uL (ref 0.7–4.0)
MCH: 26.4 pg (ref 26.0–34.0)
MCHC: 31.7 g/dL (ref 30.0–36.0)
MCV: 83.3 fL (ref 80.0–100.0)
Monocytes Absolute: 1 10*3/uL (ref 0.1–1.0)
Monocytes Relative: 8 %
Neutro Abs: 9.9 10*3/uL — ABNORMAL HIGH (ref 1.7–7.7)
Neutrophils Relative %: 80 %
Platelets: 248 10*3/uL (ref 150–400)
RBC: 3.11 MIL/uL — ABNORMAL LOW (ref 3.87–5.11)
RDW: 17.2 % — ABNORMAL HIGH (ref 11.5–15.5)
WBC: 12.4 10*3/uL — ABNORMAL HIGH (ref 4.0–10.5)
nRBC: 0 % (ref 0.0–0.2)

## 2019-03-11 LAB — RETICULOCYTES
Immature Retic Fract: 27.1 % — ABNORMAL HIGH (ref 2.3–15.9)
RBC.: 2.34 MIL/uL — ABNORMAL LOW (ref 3.87–5.11)
Retic Count, Absolute: 33 10*3/uL (ref 19.0–186.0)
Retic Ct Pct: 1.4 % (ref 0.4–3.1)

## 2019-03-11 LAB — BRAIN NATRIURETIC PEPTIDE: B Natriuretic Peptide: 56.7 pg/mL (ref 0.0–100.0)

## 2019-03-11 LAB — SARS CORONAVIRUS 2 BY RT PCR (HOSPITAL ORDER, PERFORMED IN ~~LOC~~ HOSPITAL LAB): SARS Coronavirus 2: NEGATIVE

## 2019-03-11 LAB — TYPE AND SCREEN
ABO/RH(D): B POS
Antibody Screen: NEGATIVE

## 2019-03-11 LAB — POC OCCULT BLOOD, ED: Fecal Occult Bld: POSITIVE — AB

## 2019-03-11 LAB — AMMONIA: Ammonia: 65 umol/L — ABNORMAL HIGH (ref 9–35)

## 2019-03-11 LAB — LIPASE, BLOOD: Lipase: 77 U/L — ABNORMAL HIGH (ref 11–51)

## 2019-03-11 LAB — TROPONIN I (HIGH SENSITIVITY)
Troponin I (High Sensitivity): 9 ng/L (ref <18)
Troponin I (High Sensitivity): 9 ng/L (ref <18)

## 2019-03-11 MED ORDER — METRONIDAZOLE IN NACL 5-0.79 MG/ML-% IV SOLN
500.0000 mg | Freq: Once | INTRAVENOUS | Status: AC
  Administered 2019-03-12: 01:00:00 500 mg via INTRAVENOUS
  Filled 2019-03-11: qty 100

## 2019-03-11 MED ORDER — CIPROFLOXACIN IN D5W 400 MG/200ML IV SOLN: 400.0000 mg | Freq: Once | INTRAVENOUS | Status: DC

## 2019-03-11 MED ORDER — SODIUM CHLORIDE 0.9 % IV SOLN
2.0000 g | INTRAVENOUS | Status: DC
  Administered 2019-03-12 – 2019-03-13 (×3): 2 g via INTRAVENOUS
  Filled 2019-03-11: qty 2
  Filled 2019-03-11: qty 20
  Filled 2019-03-11: qty 2

## 2019-03-11 MED ORDER — SODIUM CHLORIDE 0.9 % IV SOLN: 1.0000 g | Freq: Once | INTRAVENOUS | Status: DC

## 2019-03-11 NOTE — ED Provider Notes (Signed)
[facility] EMERGENCY DEPARTMENT Provider Note   CSN: [id number] Arrival date & time: 03/11/19  1658     History   Chief Complaint Chief Complaint  Patient presents with   Shortness of Breath    HPI Shawna Fuentes is a 85 y.o. female.     HPI  85 year old female with a history of DVT, hypertension, hyperlipidemia, hypothyroidism, CKD, chronic respiratory failure with hypercapnia, obstructive sleep apnea, presents with concern for shortness of breath.  Began last 10/22/2022.  Son passed away.  Reports decreased appetite, loss of taste but can smell things.  Legs have started swelling, given lasix increased, but feels worse now.  Feeling generally weak, some dizziness.  Shortness of breath noticed it more yesterday and today, but the leg swelling has been for one week.  Feeling weak since Oct 22, 2022 since her son died.  No chest pain.  Did have some occ mild lower abdominal pain, has had some constipation. Not doing as much.  Before son died was walking and doing well.  No vomiting, this afternoon had some nausea after trying some chicken pot pie.   Drinking ensure.   Hx of DVT when had hip fracture  No cough, no sneezing, no fevers, no urinary symptoms  Past Medical History:  Diagnosis Date   Arthritis    DVT (deep venous thrombosis) ([place]) 12/2013   bilateral upper extremity DVT found at time of thyroidectomy    History of kidney stones    Hyperlipidemia    Hypertension    Hypothyroidism (acquired) 12/2013   s/p thyroidectomy 12/21/13 at [facility]   Osteoporosis 07/15/2014   DEXA @ [facility] 07/14/14: -3.1   Thyroid goiter    s/p thyroidectomy - pathology demonstrated nodular hyperplasia    Patient Active Problem List   Diagnosis Date Noted   Pain and swelling of right lower leg 03/13/2018   Prediabetes 12/04/2017   Cough 07/10/2016   CKD (chronic kidney disease) stage 3, GFR 30-59 ml/min (HCC) 06/08/2016   Urinary incontinence 05/13/2016   Obstructive  sleep apnea 09/15/2014   Osteoporosis 07/15/2014   Arthritis pain of hand 07/10/2014   Deep vein thrombosis ([place]) 12/24/2013   Chronic respiratory failure with hypercapnia ([place]) 12/21/2013   Post-surgical hypothyroidism 12/21/2013   Hypoxemia 12/20/2013   Edema, bilateral legs 12/12/2013   Fracture, subtrochanteric, right femur, closed ([place]) 09/22/2013   Chronic radicular low back pain 12/30/2012   Osteoarthritis of right knee 12/30/2012   Hyperlipidemia    Hypertension     Past Surgical History:  Procedure Laterality Date   FEMUR IM NAIL Right 09/22/2013   Procedure: INTRAMEDULLARY (IM) NAIL FEMORAL;  Surgeon: Wylene Simmer, MD;  Location: [place];  Service: Orthopedics;  Laterality: Right;   HIP SURGERY     THYROIDECTOMY WITH STERNOTOMY  12/21/13   thyroid mass found incidentally on CT scan 12/2013; transferred to [facility] for surgical removal of thyroid and goiter requiring sternotomy     OB History   No obstetric history on file.      Home Medications    Prior to Admission medications   Medication Sig Start Date End Date Taking? Authorizing Provider  amLODipine (NORVASC) 2.5 MG tablet Take 1 tablet (2.5 mg total) by mouth daily. 12/12/18   Binnie Rail, MD  aspirin 81 MG tablet Take 1 tablet (81 mg total) by mouth daily. 04/09/14   Rowe Clack, MD  atorvastatin (LIPITOR) 10 MG tablet TAKE 1 TABLET BY MOUTH EVERY EVENING 11/28/18   Billey Gosling  J, MD  calcitRIOL (ROCALTROL) 0.25 MCG capsule TAKE 1 CAPSULE (0.25 MCG TOTAL) BY MOUTH 2 (TWO) TIMES DAILY. 01/15/19   Binnie Rail, MD  calcium carbonate (OS-CAL) 600 MG TABS tablet Take 1 tablet (600 mg total) by mouth 2 (two) times daily with a meal. 12/05/15   Burns, Claudina Lick, MD  Cholecalciferol (VITAMIN D3) 125 MCG (5000 UT) CAPS Take 1 capsule by mouth 2 (two) times daily.    [provider]  famotidine (PEPCID) 20 MG tablet TAKE 1 TABLET BY MOUTH EVERYDAY AT BEDTIME 02/12/19   Burns, Claudina Lick, MD    furosemide (LASIX) 20 MG tablet Take 1 tablet (20 mg total) by mouth daily. 12/12/18   Binnie Rail, MD  levothyroxine (SYNTHROID) 100 MCG tablet Take 1 tab daily 6 days a week, 1.5 pills one day a week 12/12/18   Binnie Rail, MD  losartan (COZAAR) 100 MG tablet TAKE 1 TABLET BY MOUTH EVERY DAY 01/15/19   Binnie Rail, MD  metoprolol tartrate (LOPRESSOR) 25 MG tablet TAKE 1/2 TABLET BY MOUTH TWICE A DAY 02/12/19   Binnie Rail, MD  nystatin (MYCOSTATIN/NYSTOP) 100000 UNIT/GM POWD Apply to affected area 3 times a day 06/20/14   Rowe Clack, MD  potassium chloride SA (K-DUR) 20 MEQ tablet Take 1 tablet (20 mEq total) by mouth daily for 3 days. 03/01/19 03/04/19  Binnie Rail, MD    Family History Family History  Problem Relation Age of Onset   Osteoarthritis Mother    Hypertension Mother    Hypertension Father    Cancer Father        unknown type   Diabetes Son    Diabetes Son    Clotting disorder Other    Rheumatologic disease Other     Social History Social History   Tobacco Use   Smoking status: Never Smoker   Smokeless tobacco: Never Used   Tobacco comment: lives w/ dtr dorthea, 2nd dtr and 3 sons in town  Substance Use Topics   Alcohol use: No    Alcohol/week: 0.0 standard drinks   Drug use: No     Allergies   Oxycodone, Tetanus toxoid adsorbed, and Tetanus toxoids   Review of Systems Review of Systems  Constitutional: Negative for fever.  HENT: Negative for congestion and sore throat.   Respiratory: Positive for shortness of breath. Negative for cough.   Cardiovascular: Positive for leg swelling. Negative for chest pain.  Gastrointestinal: Positive for constipation and nausea. Negative for abdominal pain (not current), diarrhea and vomiting.  Genitourinary: Negative for dysuria.  Musculoskeletal: Negative for back pain.  Skin: Negative for rash.  Neurological: Positive for light-headedness. Negative for headaches.     Physical  Exam Updated Vital Signs BP (!) 121/58    Pulse 88    Temp 99 F (37.2 C) (Oral)    Resp 15    Ht 5\' 3"  (1.6 m)    Wt 113.4 kg    SpO2 98%    BMI 44.29 kg/m   Physical Exam Vitals signs and nursing note reviewed.  Constitutional:      General: She is not in acute distress.    Appearance: She is well-developed. She is not diaphoretic.  HENT:     Head: Normocephalic and atraumatic.  Eyes:     Conjunctiva/sclera: Conjunctivae normal.  Neck:     Musculoskeletal: Normal range of motion.  Cardiovascular:     Rate and Rhythm: Normal rate and regular rhythm.  Heart sounds: Normal heart sounds. No murmur. No friction rub. No gallop.   Pulmonary:     Effort: Pulmonary effort is normal. No respiratory distress.     Breath sounds: Normal breath sounds. No wheezing or rales.  Abdominal:     General: There is no distension.     Palpations: Abdomen is soft.     Tenderness: There is no abdominal tenderness. There is no guarding.  Genitourinary:    Comments: Trace hemoccult obtained, no clear melena or hematochezia  Musculoskeletal:        General: No tenderness.     Right lower leg: Edema (3+) present.     Left lower leg: Edema (3+) present.  Skin:    General: Skin is warm and dry.     Findings: No erythema or rash.  Neurological:     Mental Status: She is alert and oriented to person, place, and time.      ED Treatments / Results  Labs (all labs ordered are listed, but only abnormal results are displayed) Labs Reviewed  CBC WITH DIFFERENTIAL/PLATELET - Abnormal; Notable for the following components:      Result Value   WBC 12.4 (*)    RBC 3.11 (*)    Hemoglobin 8.2 (*)    HCT 25.9 (*)    RDW 17.2 (*)    Neutro Abs 9.9 (*)    Abs Immature Granulocytes 0.12 (*)    All other components within normal limits  COMPREHENSIVE METABOLIC PANEL - Abnormal; Notable for the following components:   Sodium 130 (*)    CO2 18 (*)    BUN 39 (*)    Creatinine, Ser 2.13 (*)    Calcium  8.0 (*)    Total Protein 6.4 (*)    Albumin 2.3 (*)    AST 106 (*)    Alkaline Phosphatase 542 (*)    Total Bilirubin 8.0 (*)    GFR calc non Af Amer 21 (*)    GFR calc Af Amer 24 (*)    All other components within normal limits  LIPASE, BLOOD - Abnormal; Notable for the following components:   Lipase 77 (*)    All other components within normal limits  POC OCCULT BLOOD, ED - Abnormal; Notable for the following components:   Fecal Occult Bld POSITIVE (*)    All other components within normal limits  SARS CORONAVIRUS 2 (HOSPITAL ORDER, [facility])  BRAIN NATRIURETIC PEPTIDE  PROTIME-INR  AMMONIA  HEPATITIS PANEL, ACUTE  VITAMIN B12  FOLATE  IRON AND TIBC  FERRITIN  RETICULOCYTES  POC OCCULT BLOOD, ED  TYPE AND SCREEN  TROPONIN I (HIGH SENSITIVITY)  TROPONIN I (HIGH SENSITIVITY)    EKG EKG Interpretation  Date/Time:  Sunday March 11 2019 17:10:21 EDT Ventricular Rate:  88 PR Interval:    QRS Duration: 89 QT Interval:  344 QTC Calculation: 417 R Axis:   5 Text Interpretation:  Sinus rhythm No significant change since last tracing Confirmed by Gareth Morgan [phone]) on 03/11/2019 6:53:06 PM   Radiology Dg Chest Portable 1 View  Result Date: 03/11/2019 CLINICAL DATA:  Shortness of breath beginning today, has not been eating or drinking since October 12, 2022 when son passed away, nausea, weakness, history hypertension, hyperlipidemia EXAM: PORTABLE CHEST 1 VIEW COMPARISON:  Portable exam 1940 hours compared to 09/12/2014 FINDINGS: Upper normal heart size post median sternotomy. Mediastinal contours and pulmonary vascularity normal. Atherosclerotic calcification aorta. Chronic eventration of the RIGHT diaphragm. Bibasilar atelectasis. No infiltrate,  pleural effusion or pneumothorax. Osseous demineralization with advanced degenerative changes of BILATERAL glenohumeral joints. IMPRESSION: Bibasilar atelectasis. Electronically Signed   By: Lavonia Dana M.D.    On: 03/11/2019 19:51    Procedures Procedures (including critical care time)  Medications Ordered in ED Medications - No data to display   Initial Impression / Assessment and Plan / ED Course  I have reviewed the triage vital signs and the nursing notes.  Pertinent labs & imaging results that were available during my care of the patient were reviewed by me and considered in my medical decision making (see chart for details).        85 year old female with a history of DVT, hypertension, hyperlipidemia, hypothyroidism, CKD, chronic respiratory failure with hypercapnia, obstructive sleep apnea, presents with concern for shortness of breath, generalized weakness, decreased appetite.  DDx includes infection, CHF exacerbation, anemia.  No chest pain. CXR with bibasilar atelectasis.  Troponin negative.  Labs returned just prior to sign out to Dr. Darl Householder. Show new anemia, acute renal failure and liver failure with elevated bilirubin.  Pt reports dark stool, does not have melena on exam however it is hemoccult positive.  Ordered CT abdomen, hepatitis panel, INR, ammonia, anemia panel, type and screen. Pt denies acetaminophen or etoh use.  Tests including BNP, COVID19 testing pending at time of transfer of care. Plan for CT abdomen followed by admission for further evaluation given dyspnea, generalized weakness acute on chronic renal failure, symptomatic anemia and hepatic failure.      Shawna Fuentes was evaluated in Emergency Department on 03/11/2019 for the symptoms described in the history of present illness. She was evaluated in the context of the global COVID-19 pandemic, which necessitated consideration that the patient might be at risk for infection with the SARS-CoV-2 virus that causes COVID-19. Institutional protocols and algorithms that pertain to the evaluation of patients at risk for COVID-19 are in a state of rapid change based on information released by regulatory bodies including the CDC and  federal and state organizations. These policies and algorithms were followed during the patient's care in the ED.  Final Clinical Impressions(s) / ED Diagnoses   Final diagnoses:  Acute renal failure superimposed on chronic kidney disease, unspecified CKD stage, unspecified acute renal failure type (HCC)  Anemia, unspecified type  Symptomatic anemia  Hyperbilirubinemia  Peripheral edema  Hypoalbuminemia    ED Discharge Orders    None       Gareth Morgan, MD 03/11/19 2212

## 2019-03-11 NOTE — ED Notes (Signed)
Patient transported to CT 

## 2019-03-11 NOTE — ED Notes (Signed)
Shawna Fuentes [phone]  Patient spouse to see if we can please call with an update on patient

## 2019-03-11 NOTE — ED Provider Notes (Signed)
CT scan shows changes of cirrhosis as well as diverticulitis, gallstones and questionable liver lesions.  Right upper quadrant ultrasound will be obtained.  Patient will be started on antibiotics for diverticulitis.  Her abdomen is soft without peritoneal signs on recheck.  Admission discussed with Dr. Alcario Drought.   Ezequiel Essex, MD 03/12/19 [phone]

## 2019-03-11 NOTE — ED Provider Notes (Signed)
  Physical Exam  BP (!) 121/58   Pulse 88   Temp 99 F (37.2 C) (Oral)   Resp 15   Ht 5\' 3"  (1.6 m)   Wt 113.4 kg   SpO2 98%   BMI 44.29 kg/m   Physical Exam  ED Course/Procedures     Procedures  MDM  Care assumed at 9 pm from Dr. Billy Fischer. Patient here with SOB and her lasix was recently increased. LFTs elevated and bili elevated and patient is in acute renal failure. Sign out pending CT ab/pel and admission.   11 pm CT still pending. Signed out to Dr. Wyvonnia Dusky to admit patient.        Drenda Freeze, MD 03/11/19 [phone]

## 2019-03-11 NOTE — ED Triage Notes (Signed)
Ems reports SHOb starting today, hasnt been eating or drinking since Friday(son passed). Had an appt on Tuesday to get fluid drained. Pt currently nauseous and somewhat weak. Sinus arrmathia. 114cbg. 138/53, 100%o2, 20rr, 97.5*f.

## 2019-03-12 ENCOUNTER — Observation Stay (HOSPITAL_BASED_OUTPATIENT_CLINIC_OR_DEPARTMENT_OTHER): Payer: Medicare HMO

## 2019-03-12 ENCOUNTER — Encounter (HOSPITAL_COMMUNITY): Payer: Self-pay | Admitting: Internal Medicine

## 2019-03-12 ENCOUNTER — Emergency Department (HOSPITAL_COMMUNITY): Payer: Medicare HMO

## 2019-03-12 DIAGNOSIS — Z885 Allergy status to narcotic agent status: Secondary | ICD-10-CM | POA: Diagnosis not present

## 2019-03-12 DIAGNOSIS — Z86718 Personal history of other venous thrombosis and embolism: Secondary | ICD-10-CM | POA: Diagnosis not present

## 2019-03-12 DIAGNOSIS — E7220 Disorder of urea cycle metabolism, unspecified: Secondary | ICD-10-CM | POA: Diagnosis present

## 2019-03-12 DIAGNOSIS — N183 Chronic kidney disease, stage 3 unspecified: Secondary | ICD-10-CM | POA: Diagnosis present

## 2019-03-12 DIAGNOSIS — C801 Malignant (primary) neoplasm, unspecified: Secondary | ICD-10-CM | POA: Diagnosis not present

## 2019-03-12 DIAGNOSIS — G4733 Obstructive sleep apnea (adult) (pediatric): Secondary | ICD-10-CM | POA: Diagnosis present

## 2019-03-12 DIAGNOSIS — K5732 Diverticulitis of large intestine without perforation or abscess without bleeding: Secondary | ICD-10-CM | POA: Diagnosis present

## 2019-03-12 DIAGNOSIS — K5792 Diverticulitis of intestine, part unspecified, without perforation or abscess without bleeding: Secondary | ICD-10-CM

## 2019-03-12 DIAGNOSIS — K8020 Calculus of gallbladder without cholecystitis without obstruction: Secondary | ICD-10-CM | POA: Diagnosis not present

## 2019-03-12 DIAGNOSIS — K222 Esophageal obstruction: Secondary | ICD-10-CM | POA: Diagnosis not present

## 2019-03-12 DIAGNOSIS — I129 Hypertensive chronic kidney disease with stage 1 through stage 4 chronic kidney disease, or unspecified chronic kidney disease: Secondary | ICD-10-CM | POA: Diagnosis present

## 2019-03-12 DIAGNOSIS — Z20828 Contact with and (suspected) exposure to other viral communicable diseases: Secondary | ICD-10-CM | POA: Diagnosis present

## 2019-03-12 DIAGNOSIS — Z887 Allergy status to serum and vaccine status: Secondary | ICD-10-CM | POA: Diagnosis not present

## 2019-03-12 DIAGNOSIS — Z6841 Body Mass Index (BMI) 40.0 and over, adult: Secondary | ICD-10-CM | POA: Diagnosis not present

## 2019-03-12 DIAGNOSIS — R0602 Shortness of breath: Secondary | ICD-10-CM | POA: Diagnosis present

## 2019-03-12 DIAGNOSIS — I10 Essential (primary) hypertension: Secondary | ICD-10-CM

## 2019-03-12 DIAGNOSIS — R748 Abnormal levels of other serum enzymes: Secondary | ICD-10-CM

## 2019-03-12 DIAGNOSIS — Z515 Encounter for palliative care: Secondary | ICD-10-CM | POA: Diagnosis not present

## 2019-03-12 DIAGNOSIS — Z8249 Family history of ischemic heart disease and other diseases of the circulatory system: Secondary | ICD-10-CM | POA: Diagnosis not present

## 2019-03-12 DIAGNOSIS — N189 Chronic kidney disease, unspecified: Secondary | ICD-10-CM

## 2019-03-12 DIAGNOSIS — Z66 Do not resuscitate: Secondary | ICD-10-CM | POA: Diagnosis not present

## 2019-03-12 DIAGNOSIS — D649 Anemia, unspecified: Secondary | ICD-10-CM | POA: Diagnosis not present

## 2019-03-12 DIAGNOSIS — K7460 Unspecified cirrhosis of liver: Secondary | ICD-10-CM | POA: Diagnosis present

## 2019-03-12 DIAGNOSIS — J9612 Chronic respiratory failure with hypercapnia: Secondary | ICD-10-CM | POA: Diagnosis present

## 2019-03-12 DIAGNOSIS — K767 Hepatorenal syndrome: Secondary | ICD-10-CM | POA: Diagnosis present

## 2019-03-12 DIAGNOSIS — R945 Abnormal results of liver function studies: Secondary | ICD-10-CM | POA: Diagnosis not present

## 2019-03-12 DIAGNOSIS — E890 Postprocedural hypothyroidism: Secondary | ICD-10-CM | POA: Diagnosis present

## 2019-03-12 DIAGNOSIS — Z833 Family history of diabetes mellitus: Secondary | ICD-10-CM | POA: Diagnosis not present

## 2019-03-12 DIAGNOSIS — Z809 Family history of malignant neoplasm, unspecified: Secondary | ICD-10-CM | POA: Diagnosis not present

## 2019-03-12 DIAGNOSIS — E785 Hyperlipidemia, unspecified: Secondary | ICD-10-CM | POA: Diagnosis present

## 2019-03-12 DIAGNOSIS — D689 Coagulation defect, unspecified: Secondary | ICD-10-CM

## 2019-03-12 DIAGNOSIS — Z79890 Hormone replacement therapy (postmenopausal): Secondary | ICD-10-CM | POA: Diagnosis not present

## 2019-03-12 DIAGNOSIS — C787 Secondary malignant neoplasm of liver and intrahepatic bile duct: Secondary | ICD-10-CM | POA: Diagnosis present

## 2019-03-12 DIAGNOSIS — K317 Polyp of stomach and duodenum: Secondary | ICD-10-CM | POA: Diagnosis not present

## 2019-03-12 DIAGNOSIS — N179 Acute kidney failure, unspecified: Secondary | ICD-10-CM | POA: Diagnosis present

## 2019-03-12 DIAGNOSIS — M1990 Unspecified osteoarthritis, unspecified site: Secondary | ICD-10-CM | POA: Diagnosis present

## 2019-03-12 DIAGNOSIS — Z7982 Long term (current) use of aspirin: Secondary | ICD-10-CM | POA: Diagnosis not present

## 2019-03-12 DIAGNOSIS — M810 Age-related osteoporosis without current pathological fracture: Secondary | ICD-10-CM | POA: Diagnosis present

## 2019-03-12 DIAGNOSIS — R1310 Dysphagia, unspecified: Secondary | ICD-10-CM | POA: Diagnosis not present

## 2019-03-12 LAB — RETICULOCYTES
Immature Retic Fract: 26.2 % — ABNORMAL HIGH (ref 2.3–15.9)
RBC.: 2.99 MIL/uL — ABNORMAL LOW (ref 3.87–5.11)
Retic Count, Absolute: 46.3 10*3/uL (ref 19.0–186.0)
Retic Ct Pct: 1.6 % (ref 0.4–3.1)

## 2019-03-12 LAB — BILIRUBIN, FRACTIONATED(TOT/DIR/INDIR)
Bilirubin, Direct: 4.9 mg/dL — ABNORMAL HIGH (ref 0.0–0.2)
Indirect Bilirubin: 2.9 mg/dL — ABNORMAL HIGH (ref 0.3–0.9)
Total Bilirubin: 7.8 mg/dL — ABNORMAL HIGH (ref 0.3–1.2)

## 2019-03-12 LAB — CBC
HCT: 23.2 % — ABNORMAL LOW (ref 36.0–46.0)
Hemoglobin: 7.3 g/dL — ABNORMAL LOW (ref 12.0–15.0)
MCH: 26.8 pg (ref 26.0–34.0)
MCHC: 31.5 g/dL (ref 30.0–36.0)
MCV: 85.3 fL (ref 80.0–100.0)
Platelets: UNDETERMINED 10*3/uL (ref 150–400)
RBC: 2.72 MIL/uL — ABNORMAL LOW (ref 3.87–5.11)
RDW: 17.3 % — ABNORMAL HIGH (ref 11.5–15.5)
WBC: 6.5 10*3/uL (ref 4.0–10.5)
nRBC: 0.3 % — ABNORMAL HIGH (ref 0.0–0.2)

## 2019-03-12 LAB — ECHOCARDIOGRAM COMPLETE
Height: 63 in
Weight: 4000 oz

## 2019-03-12 LAB — COMPREHENSIVE METABOLIC PANEL
ALT: 39 U/L (ref 0–44)
AST: 102 U/L — ABNORMAL HIGH (ref 15–41)
Albumin: 2.2 g/dL — ABNORMAL LOW (ref 3.5–5.0)
Alkaline Phosphatase: 606 U/L — ABNORMAL HIGH (ref 38–126)
Anion gap: 14 (ref 5–15)
BUN: 36 mg/dL — ABNORMAL HIGH (ref 8–23)
CO2: 14 mmol/L — ABNORMAL LOW (ref 22–32)
Calcium: 7.8 mg/dL — ABNORMAL LOW (ref 8.9–10.3)
Chloride: 105 mmol/L (ref 98–111)
Creatinine, Ser: 2.05 mg/dL — ABNORMAL HIGH (ref 0.44–1.00)
GFR calc Af Amer: 25 mL/min — ABNORMAL LOW (ref >60)
GFR calc non Af Amer: 22 mL/min — ABNORMAL LOW (ref >60)
Glucose, Bld: 93 mg/dL (ref 70–99)
Potassium: 4.9 mmol/L (ref 3.5–5.1)
Sodium: 133 mmol/L — ABNORMAL LOW (ref 135–145)
Total Bilirubin: 8.2 mg/dL — ABNORMAL HIGH (ref 0.3–1.2)
Total Protein: 6.3 g/dL — ABNORMAL LOW (ref 6.5–8.1)

## 2019-03-12 LAB — POCT I-STAT 7, (LYTES, BLD GAS, ICA,H+H)
Acid-base deficit: 5 mmol/L — ABNORMAL HIGH (ref 0.0–2.0)
Bicarbonate: 18.9 mmol/L — ABNORMAL LOW (ref 20.0–28.0)
Calcium, Ion: 1.14 mmol/L — ABNORMAL LOW (ref 1.15–1.40)
HCT: 27 % — ABNORMAL LOW (ref 36.0–46.0)
Hemoglobin: 9.2 g/dL — ABNORMAL LOW (ref 12.0–15.0)
O2 Saturation: 97 %
Patient temperature: 98.7
Potassium: 4.7 mmol/L (ref 3.5–5.1)
Sodium: 133 mmol/L — ABNORMAL LOW (ref 135–145)
TCO2: 20 mmol/L — ABNORMAL LOW (ref 22–32)
pCO2 arterial: 30.7 mmHg — ABNORMAL LOW (ref 32.0–48.0)
pH, Arterial: 7.398 (ref 7.350–7.450)
pO2, Arterial: 89 mmHg (ref 83.0–108.0)

## 2019-03-12 LAB — IRON AND TIBC
Iron: 12 ug/dL — ABNORMAL LOW (ref 28–170)
Saturation Ratios: 10 % — ABNORMAL LOW (ref 10.4–31.8)
TIBC: 122 ug/dL — ABNORMAL LOW (ref 250–450)
UIBC: 110 ug/dL

## 2019-03-12 LAB — FERRITIN: Ferritin: 635 ng/mL — ABNORMAL HIGH (ref 11–307)

## 2019-03-12 LAB — TSH: TSH: 1.4 u[IU]/mL (ref 0.350–4.500)

## 2019-03-12 LAB — LACTIC ACID, PLASMA
Lactic Acid, Venous: 1.5 mmol/L (ref 0.5–1.9)
Lactic Acid, Venous: 0.9 mmol/L (ref 0.5–1.9)

## 2019-03-12 LAB — LACTATE DEHYDROGENASE: LDH: 618 U/L — ABNORMAL HIGH (ref 98–192)

## 2019-03-12 LAB — VITAMIN B12: Vitamin B-12: 1442 pg/mL — ABNORMAL HIGH (ref 180–914)

## 2019-03-12 MED ORDER — PANTOPRAZOLE SODIUM 40 MG PO TBEC
40.0000 mg | DELAYED_RELEASE_TABLET | Freq: Every day | ORAL | Status: DC
  Administered 2019-03-13 – 2019-03-17 (×5): 40 mg via ORAL
  Filled 2019-03-12 (×5): qty 1

## 2019-03-12 MED ORDER — SODIUM CHLORIDE 0.9 % IV SOLN
510.0000 mg | Freq: Once | INTRAVENOUS | Status: AC
  Administered 2019-03-12: 510 mg via INTRAVENOUS
  Filled 2019-03-12: qty 17

## 2019-03-12 MED ORDER — AMLODIPINE BESYLATE 5 MG PO TABS
5.0000 mg | ORAL_TABLET | Freq: Every day | ORAL | Status: DC
  Administered 2019-03-12 – 2019-03-17 (×4): 5 mg via ORAL
  Filled 2019-03-12 (×6): qty 1

## 2019-03-12 MED ORDER — ONDANSETRON HCL 4 MG/2ML IJ SOLN
4.0000 mg | Freq: Four times a day (QID) | INTRAMUSCULAR | Status: DC | PRN
  Administered 2019-03-13: 09:00:00 4 mg via INTRAVENOUS
  Filled 2019-03-12: qty 2

## 2019-03-12 MED ORDER — ACETAMINOPHEN 325 MG PO TABS
650.0000 mg | ORAL_TABLET | Freq: Four times a day (QID) | ORAL | Status: DC | PRN
  Administered 2019-03-17 (×2): 325 mg via ORAL
  Filled 2019-03-12 (×2): qty 2

## 2019-03-12 MED ORDER — ONDANSETRON HCL 4 MG PO TABS: 4.0000 mg | ORAL_TABLET | Freq: Four times a day (QID) | ORAL | Status: DC | PRN

## 2019-03-12 MED ORDER — ENOXAPARIN SODIUM 40 MG/0.4ML ~~LOC~~ SOLN: 40.0000 mg | SUBCUTANEOUS | Status: DC

## 2019-03-12 MED ORDER — LEVOTHYROXINE SODIUM 100 MCG PO TABS
100.0000 ug | ORAL_TABLET | Freq: Every day | ORAL | Status: DC
  Administered 2019-03-14 – 2019-03-18 (×5): 100 ug via ORAL
  Filled 2019-03-12 (×5): qty 1

## 2019-03-12 MED ORDER — SODIUM CHLORIDE 0.9 % IV SOLN
INTRAVENOUS | Status: DC
  Administered 2019-03-12: 02:00:00 via INTRAVENOUS

## 2019-03-12 MED ORDER — METRONIDAZOLE IN NACL 5-0.79 MG/ML-% IV SOLN
500.0000 mg | Freq: Three times a day (TID) | INTRAVENOUS | Status: DC
  Administered 2019-03-12 – 2019-03-14 (×8): 500 mg via INTRAVENOUS
  Filled 2019-03-12 (×8): qty 100

## 2019-03-12 MED ORDER — LACTULOSE 10 GM/15ML PO SOLN
10.0000 g | Freq: Two times a day (BID) | ORAL | Status: DC
  Administered 2019-03-12 – 2019-03-17 (×9): 10 g via ORAL
  Filled 2019-03-12 (×13): qty 15

## 2019-03-12 MED ORDER — ACETAMINOPHEN 650 MG RE SUPP: 650.0000 mg | Freq: Four times a day (QID) | RECTAL | Status: DC | PRN

## 2019-03-12 NOTE — Progress Notes (Signed)
Shawna Fuentes is a 85 y.o. female patient admitted from ED awake, alert - oriented  X 4 - no acute distress noted.  VSS - Blood pressure (!) 119/49, pulse 88, temperature 98.8 F (37.1 C), temperature source Oral, resp. rate 19, height 5\' 3"  (1.6 m), weight 113.4 kg, SpO2 100 %.    IV in place, occlusive dsg intact without redness.    Will cont to eval and treat per MD orders.  Vidal Schwalbe, RN 03/12/2019 0221

## 2019-03-12 NOTE — H&P (Signed)
History and Physical    Shawna Fuentes LEX:[id number] DOB: 10/26/1932 DOA: 03/11/2019  PCP: Binnie Rail, MD  Patient coming from: Home  I have personally briefly reviewed patient's old medical records in [place]  Chief Complaint: SOB, generalized weakness  HPI: Shawna Fuentes is a 85 y.o. female with medical history significant of DVT, HTN, OSA chronic resp failure with hypoxia, CKD stage 3.  Patient presents to the ED with c/o 1 week history of SOB, generalized weakness, loss of appetite.  Son passed away on September 22, 2022.  Symptoms ongoing since that time.  She has had increased leg swelling recently for which PCP increased dose of Lasix.  Hasnt helped symptoms.  She also reports occasional mid left lower abdominal pain with constipation.  No vomiting, had some nausea after trying to eat, drinking ensure.   ED Course: Despite the initial suspicion that symptoms would be due to grief.  She actually has multiple medical abnormalities and findings:  Acute diverticulitis demonstrated on CT scan, wbc 12.4K.  Anemia with HGB 8.2 down from 13.3 baseline in Dec last year.  Trace positive hemoccult though no frank large GIB.  Apparent new diagnosis of cirrhosis appearance of liver on imaging, some question of mass vs nodular appearance on CT scan though [place] didn't show mass.  Ammonia of 65, tbili of 8, AST 106, ALT 43, lipase 77, ALK 542.  Creat 2.1 up from 1.2 baseline  Sodium 130.  Cholelithiasis without acute cholecystitis.   Review of Systems: As per HPI, otherwise all review of systems negative.  Past Medical History:  Diagnosis Date  . Arthritis   . DVT (deep venous thrombosis) ([place]) 12/2013   bilateral upper extremity DVT found at time of thyroidectomy   . History of kidney stones   . Hyperlipidemia   . Hypertension   . Hypothyroidism (acquired) 12/2013   s/p thyroidectomy 12/21/13 at [facility]  . Osteoporosis 07/15/2014   DEXA @ [facility] 07/14/14: -3.1  . Thyroid goiter    s/p thyroidectomy - pathology demonstrated nodular hyperplasia    Past Surgical History:  Procedure Laterality Date  . FEMUR IM NAIL Right 09/22/2013   Procedure: INTRAMEDULLARY (IM) NAIL FEMORAL;  Surgeon: Wylene Simmer, MD;  Location: [place];  Service: Orthopedics;  Laterality: Right;  . HIP SURGERY    . THYROIDECTOMY WITH STERNOTOMY  12/21/13   thyroid mass found incidentally on CT scan 12/2013; transferred to [facility] [place] for surgical removal of thyroid and goiter requiring sternotomy     reports that she has never smoked. She has never used smokeless tobacco. She reports that she does not drink alcohol or use drugs.  Allergies  Allergen Reactions  . Oxycodone Other (See Comments)    Unknown goofiness  . Tetanus Toxoid Adsorbed Other (See Comments)    unknown  . Tetanus Toxoids     Pt declines / states she is not sure why / discussed out of pocket and may wait until resources are saved    Family History  Problem Relation Age of Onset  . Osteoarthritis Mother   . Hypertension Mother   . Hypertension Father   . Cancer Father        unknown type  . Diabetes Son   . Diabetes Son   . Clotting disorder Other   . Rheumatologic disease Other      Prior to Admission medications   Medication Sig Start Date End Date Taking? Authorizing Provider  amLODipine (NORVASC) 2.5 MG tablet Take 1 tablet (2.5  mg total) by mouth daily. 12/12/18   Binnie Rail, MD  aspirin 81 MG tablet Take 1 tablet (81 mg total) by mouth daily. 04/09/14   Rowe Clack, MD  atorvastatin (LIPITOR) 10 MG tablet TAKE 1 TABLET BY MOUTH EVERY EVENING 11/28/18   Burns, Claudina Lick, MD  calcitRIOL (ROCALTROL) 0.25 MCG capsule TAKE 1 CAPSULE (0.25 MCG TOTAL) BY MOUTH 2 (TWO) TIMES DAILY. 01/15/19   Binnie Rail, MD  calcium carbonate (OS-CAL) 600 MG TABS tablet Take 1 tablet (600 mg total) by mouth 2 (two) times daily with a meal. 12/05/15   Burns, Claudina Lick, MD  Cholecalciferol (VITAMIN D3) 125 MCG (5000 UT) CAPS Take 1  capsule by mouth 2 (two) times daily.    [provider]  famotidine (PEPCID) 20 MG tablet TAKE 1 TABLET BY MOUTH EVERYDAY AT BEDTIME 02/12/19   Burns, Claudina Lick, MD  furosemide (LASIX) 20 MG tablet Take 1 tablet (20 mg total) by mouth daily. 12/12/18   Binnie Rail, MD  levothyroxine (SYNTHROID) 100 MCG tablet Take 1 tab daily 6 days a week, 1.5 pills one day a week 12/12/18   Binnie Rail, MD  losartan (COZAAR) 100 MG tablet TAKE 1 TABLET BY MOUTH EVERY DAY 01/15/19   Binnie Rail, MD  metoprolol tartrate (LOPRESSOR) 25 MG tablet TAKE 1/2 TABLET BY MOUTH TWICE A DAY 02/12/19   Binnie Rail, MD  nystatin (MYCOSTATIN/NYSTOP) 100000 UNIT/GM POWD Apply to affected area 3 times a day 06/20/14   Rowe Clack, MD  potassium chloride SA (K-DUR) 20 MEQ tablet Take 1 tablet (20 mEq total) by mouth daily for 3 days. 03/01/19 03/11/19  Binnie Rail, MD  PROLIA 60 MG/ML SOSY injection  02/06/19   [provider]    Physical Exam: Vitals:   03/11/19 1713 03/11/19 1715 03/11/19 1723 03/12/19 0030  BP: (!) 117/47 (!) 121/58  (!) 136/58  Pulse: 87  88 91  Resp: 18 (!) 23 15 (!) 23  Temp: 99 F (37.2 C)     TempSrc: Oral     SpO2: 99%  98% 96%  Weight:      Height:        Constitutional: NAD, calm, comfortable Eyes: PERRL, lids and conjunctivae normal ENMT: Mucous membranes are moist. Posterior pharynx clear of any exudate or lesions.Normal dentition.  Neck: normal, supple, no masses, no thyromegaly Respiratory: clear to auscultation bilaterally, no wheezing, no crackles. Normal respiratory effort. No accessory muscle use.  Cardiovascular: Regular rate and rhythm, no murmurs / rubs / gallops. 3+ BLE edema 2+ pedal pulses. No carotid bruits.  Abdomen: no tenderness, no masses palpated. No hepatosplenomegaly. Bowel sounds positive.  Musculoskeletal: no clubbing / cyanosis. No joint deformity upper and lower extremities. Good ROM, no contractures. Normal muscle tone.  Skin: no  rashes, lesions, ulcers. No induration Neurologic: CN 2-12 grossly intact. Sensation intact, DTR normal. Strength 5/5 in all 4.  Psychiatric: Normal judgment and insight. Alert and oriented x 3. Normal mood.    Labs on Admission: I have personally reviewed following labs and imaging studies  CBC: Recent Labs  Lab 03/11/19 2040 03/12/19 0055  WBC 12.4*  --   NEUTROABS 9.9*  --   HGB 8.2* 9.2*  HCT 25.9* 27.0*  MCV 83.3  --   PLT 248  --    Basic Metabolic Panel: Recent Labs  Lab 03/11/19 2040 03/12/19 0055  NA 130* 133*  K 4.9 4.7  CL 102  --  CO2 18*  --   GLUCOSE 98  --   BUN 39*  --   CREATININE 2.13*  --   CALCIUM 8.0*  --    GFR: Estimated Creatinine Clearance: 23.8 mL/min (A) (by C-G formula based on SCr of 2.13 mg/dL (H)). Liver Function Tests: Recent Labs  Lab 03/11/19 2040  AST 106*  ALT 43  ALKPHOS 542*  BILITOT 8.0*  PROT 6.4*  ALBUMIN 2.3*   Recent Labs  Lab 03/11/19 2040  LIPASE 77*   Recent Labs  Lab 03/11/19 2208  AMMONIA 65*   Coagulation Profile: Recent Labs  Lab 03/11/19 2208  INR 1.5*   Cardiac Enzymes: No results for input(s): CKTOTAL, CKMB, CKMBINDEX, TROPONINI in the last 168 hours. BNP (last 3 results) No results for input(s): PROBNP in the last 8760 hours. HbA1C: No results for input(s): HGBA1C in the last 72 hours. CBG: No results for input(s): GLUCAP in the last 168 hours. Lipid Profile: No results for input(s): CHOL, HDL, LDLCALC, TRIG, CHOLHDL, LDLDIRECT in the last 72 hours. Thyroid Function Tests: No results for input(s): TSH, T4TOTAL, FREET4, T3FREE, THYROIDAB in the last 72 hours. Anemia Panel: Recent Labs    03/11/19 2208  VITAMINB12 1,442*  FOLATE 7.0  FERRITIN 635*  TIBC 122*  IRON 12*  RETICCTPCT 1.4   Urine analysis:    Component Value Date/Time   COLORURINE YELLOW 12/20/2013 2119   APPEARANCEUR CLOUDY (A) 12/20/2013 2119   LABSPEC >1.046 (H) 12/20/2013 2119   PHURINE 5.5 12/20/2013 2119    GLUCOSEU NEGATIVE 12/20/2013 2119   HGBUR TRACE (A) 12/20/2013 2119   BILIRUBINUR NEGATIVE 12/20/2013 2119   KETONESUR NEGATIVE 12/20/2013 2119   PROTEINUR NEGATIVE 12/20/2013 2119   UROBILINOGEN 1.0 12/20/2013 2119   NITRITE NEGATIVE 12/20/2013 2119   LEUKOCYTESUR MODERATE (A) 12/20/2013 2119    Radiological Exams on Admission: Dg Chest Portable 1 View  Result Date: 03/11/2019 CLINICAL DATA:  Shortness of breath beginning today, has not been eating or drinking since October 07, 2022 when son passed away, nausea, weakness, history hypertension, hyperlipidemia EXAM: PORTABLE CHEST 1 VIEW COMPARISON:  Portable exam 1940 hours compared to 09/12/2014 FINDINGS: Upper normal heart size post median sternotomy. Mediastinal contours and pulmonary vascularity normal. Atherosclerotic calcification aorta. Chronic eventration of the RIGHT diaphragm. Bibasilar atelectasis. No infiltrate, pleural effusion or pneumothorax. Osseous demineralization with advanced degenerative changes of BILATERAL glenohumeral joints. IMPRESSION: Bibasilar atelectasis. Electronically Signed   By: Lavonia Dana M.D.   On: 03/11/2019 19:51   Ct Renal Stone Study  Result Date: 03/11/2019 CLINICAL DATA:  Left flank pain EXAM: CT ABDOMEN AND PELVIS WITHOUT CONTRAST TECHNIQUE: Multidetector CT imaging of the abdomen and pelvis was performed following the standard protocol without IV contrast. COMPARISON:  None. FINDINGS: Lower chest: No acute abnormality. Hepatobiliary: Nodular contours within the liver compatible with cirrhosis. There are ill-defined low-density areas within the liver, cannot exclude focal masses. Gallbladder is distended and appears to be filled with gallstones. No biliary ductal dilatation. Pancreas: No focal abnormality or ductal dilatation. Spleen: No focal abnormality.  Normal size. Adrenals/Urinary Tract: Bilateral renal cysts. No renal or ureteral stones. No hydronephrosis. Urinary bladder and adrenal glands are unremarkable.  Stomach/Bowel: Colonic diverticulosis, most pronounced in the descending colon and sigmoid colon. Inflammatory stranding around the mid descending colon compatible with active diverticulitis. Stomach and small bowel decompressed, grossly unremarkable. Appendix normal. Vascular/Lymphatic: Aortic atherosclerosis. No enlarged abdominal or pelvic lymph nodes. Reproductive: Uterus and adnexa unremarkable.  No mass. Other: No free fluid or free air.  Musculoskeletal: No acute bony abnormality. IMPRESSION: Changes of cirrhosis. There are scattered ill-defined low-density areas within the liver. This could reflect geographic fatty infiltration, but focal hepatic masses/lesions cannot be excluded on this unenhanced study. This could be further evaluated with contrast-enhanced CT or MRI. Gallbladder appears to be distended and filled with gallstones. Colonic diverticulosis. Inflammatory stranding around the mid descending colon compatible with active diverticulitis. Aortic atherosclerosis. No renal or ureteral stones.  No hydronephrosis. Electronically Signed   By: Rolm Baptise M.D.   On: 03/11/2019 23:20   US Abdomen Limited Ruq  Result Date: 03/12/2019 CLINICAL DATA:  Initial evaluation for elevated LFTs, abnormal CT. EXAM: ULTRASOUND ABDOMEN LIMITED RIGHT UPPER QUADRANT COMPARISON:  Prior CT from 03/11/2019. FINDINGS: Gallbladder: Gallbladder distended with multiple internal shadowing echogenic stones, largest of which measures approximately 6 mm. A Wes sign is present. Gallbladder wall measures at the upper limits of normal at 3 mm in thickness. No free pericholecystic fluid. No sonographic Murphy sign elicited on exam. Common bile duct: Diameter: 6 mm Liver: Liver demonstrates a heterogeneous echotexture with nodular contour, consistent with cirrhosis. No focal intrahepatic mass identified by sonography. Portal vein is patent on color Doppler imaging with normal direction of blood flow towards the liver. Other:  Incidental note made of 2 small simple cysts within the right kidney, also seen on prior CT. IMPRESSION: 1. Distended gallbladder filled with stones. No other sonographic features to suggest acute cholecystitis. 2. No biliary dilatation. 3. Morphologic changes consistent with cirrhosis. No discrete intrahepatic mass evident by sonography. Electronically Signed   By: Jeannine Boga M.D.   On: 03/12/2019 00:25    EKG: Independently reviewed.  Assessment/Plan Principal Problem:   Acute diverticulitis Active Problems:   Hypertension   Chronic respiratory failure with hypercapnia (HCC)   Hyperammonemia (HCC)   Cirrhosis of liver (HCC)   Cholelithiasis   Anemia    1. Acute diverticulitis - 1. Rocephin / flagyl 2. Repeat CBC in AM 2. AKI on CKD stage 3 1. Suspect Mildly dehydrated from diverticulitis, and recently increased lasix dose: 1. Hold lasix 2. Gentle hydration with NS at 75 3. Strict intake and output 3. Cirrhosis of liver - 1. New diagnosis 2. Check hepatitis pnl 3. RUQ [place]: 1. Cirrhosis 2. No obvious masses 4. CT scan: 1. Had ? Of masses vs nodular appearance of cirrhosis. 2. May need liver MRI, but will defer to GI for now 5. Likely warrants GI evaluation and work up for this and anemia with trace hemoccult positive. 6. Also hyperammonemia: 1. Start lactulose 10 BID 7. No mention of ascites in abdomen on imaging today. 4. Anemia - 1. Trace hemoccult positive 2. Repeat CBC in am 3. Call GI in AM 5. Chronic respiratory failure with hypercapnia - 1. Due to OSA 2. Suspect worsening symptoms due to AKI with decreased bicarb  DVT prophylaxis: SCDs Code Status: Full Family Communication: no family in room Disposition Plan: Home after admit Consults called: none, call GI in AM Admission status: Place in 24     Antoino Westhoff, [facility]  How to contact the [facility] Attending or Consulting provider [place] or covering provider during after hours [place], for this patient?  1. Check the care team in [facility] and look for a) attending/consulting [facility] provider listed and b) the [facility] team listed 2. Log into [email or web address]  Amion Physician Scheduling and messaging for groups and whole hospitals  On call and physician scheduling software for group practices, residents,  hospitalists and other medical providers for call, clinic, rotation and shift schedules. On[employer] is a hospital-wide system for scheduling doctors and paging doctors on call. EasyPlot is for scientific plotting and data analysis.  [email or web address]  and use [facility]'s universal password to access. If you do not have the password, please contact the hospital operator.  3. Locate the [facility] provider you are looking for under [facility] and page to a number that you can be directly reached. 4. If you still have difficulty reaching the provider, please page the [facility] (Director on Call) for the Hospitalists listed on amion for assistance.  03/12/2019, 1:07 AM

## 2019-03-12 NOTE — ED Notes (Signed)
Patient transported to Ultrasound 

## 2019-03-12 NOTE — Consult Note (Addendum)
Referring Provider: Dyanne Carrel, NP Primary Care Physician:  Binnie Rail, MD  Reason for Consultation:  Diverticulitis, abnormal liver enzymes, anemia   IMPRESSION:  Acute descending colon diverticulitis by CT scan Abnormal liver enzymes    - normal liver enzymes over the years, last 06/2018    - acute elevation in alk phos, total bilirubin noted on admission Progressive normocytic anemia without overt bleeding Cirrhosis by imaging without evidence for portal hypertension    - Platelet count is normal    - FIB4 of 5.53     - APRI 1.003 Mild coagulopathy with an INR of 1.5 Hypoalbuminemia Acute on chronic kidney disease (stage 3) Asymptomatic cholelithiasis No prior colon cancer screening Family history of colon polyps (son)  Agree with antibiotics for treatment of CT diagnosed descending colon diverticulitis.  Marked elevation in her alkaline phosphatase and total bilirubin are an acute change from her labs in December 2019.  This is worrisome for biliary ductal abnormalities of either large or small ducts.  In addition, she has possible cirrhosis mentioned on non-contrasted CT of the abd/pelvis and ultrasound. I have personally reviewed the images and see no evidence for portal hypertension. She has no history of cirrhosis or risk factors for chronic liver disease beyond possible fatty liver. No stigmata of chronic liver disease on exam.  A diagnosis of cirrhosis is suggested by her Fib4, but is not supported by her current platelet count or her APRI.  CT scan also suggested the possibility of lesions in her liver but these were not seen on ultrasound.  Ideally we will proceed with MRI and MRCP when her renal function normalizes.  We will plan serologic evaluation for acute cholestatic hepatitis, PBC, and PSC.  However, I am also concerned about the potential for malignancy. Unclear if coagulopathy is due to poor nutrition following the unexpected death of her youngest son and/or  hepatic synthetic dysfunction.   Shawna Fuentes has progressive anemia without overt GI bleeding.  Stools were Hemoccult positive in the setting of acute diverticulitis.  Must exclude hemolytic anemia given the acute hyperbilirubinemia.  Will consider endoscopic evaluation with both colonoscopy and upper endoscopy after treatment of acute diverticulitis.  PLAN: - Complete at least 10 days of antibiotics for diverticulitis - MRI/MRCP when her renal function improves - Labs to evaluate her progressive anemia - LDH, haptoglobin, direct bilirubin, and reticulocyte count - Serial hgb/hct with transfusion as indicated - ANA, AMA, IgG, IgM, acute hepatitis panel, GGTP - Consider endoscopic evaluation after treatment of acute diverticulitis - Consider replacement of Vitamin K with 24m oral daily x 3 days, repeat INR on day 4  We will follow with you and make additional recommendations as results become available.   HPI: Shawna MARONEYis a 85y.o. female is seen in consultation at the request of NP Black for diverticulitis, abnormal liver enzymes, and anemia.  The history is obtained through the patient, her son who is with her at the bedside, and review of her electronic health record.  She has morbid obesity, obstructive sleep apnea, hypertension, osteoporosis, prediabetes, post-surgical hypothyroidism, stage III chronic kidney disease, and dyslipidemia.    Her youngest son died unexpectedly last week.  She has not been eating or drinking well since that time.  She presented to the emergency room with progressive weakness, shortness of breath, diffuse abdominal pain, anorexia, and lower extremity edema.  CT scan in the emergency room showed diverticulitis.  She noticed improved abdominal pain after 24 hours of  Rocephin and Flagyl.  Labs revealed anemia with a hemoglobin of 8.2, MCV 83.3, RDW 17.2, platelets 248.  WBC 12.4.  Sodium 130.  Acute kidney injury with a creatinine of 2.1 up from her baseline of  1.2.  AST 106, ALT 43, alk phos 542, total bilirubin 0.8, total protein 6.4, albumin 2.3..  BNP 56.7.  Iron 12, ferritin 635, saturation ratios 10.  Folate 7.  B12 1442.   INR 1.5.  TSH 1.4.  COVID-19 negative.  Stool was Hemoccult positive.  Her liver enzymes have been normal on multiple occasions over the years.  Most recently her labs from 06/08/2018 show AST 16, ALT 15, alk phos 82, total bilirubin 0.7, albumin 4.1.  CT abd/pelvis without contrast: nodular liver with ill-defined low-density areas within the liver; gallstones, diverticulosis with mid descending colon diverticulitis.   Abdominal ultrasound 03/12/19: multiple stones in the gallbadder, CBD 26m, echogenic liver with a nodular contour; no liver masses seen; normal flow through the portal vein  No prior abdominal imaging available in Epic or CareEverywhere.   No prior history of abnormal liver enzymes or liver disease.  No prior blood donation.  No prior blood transfusion.  No history of jaundice or scleral icterus prior to this admission.  No family members had noticed her jaundice prior to admission. No history of alcohol use.  No history of use or experimentation with IV or intranasal street drugs.  No history of autoimmune disease.  No family history of liver disease. Lasix was recently increased.  No new medications, OTC meds, or complimentary or herbal therapies. No recent antibiotics. Her thyroid has been well controlled.   No overt GI blood loss. No melena, hematochezia, bright red blood per rectum. She has chronic constipation. No diarrhea or recent change in bowel habits. No epistaxis, vaginal bleeding, hemoptysis, or hematuria.   No prior endoscopic evaluation or colon cancer screening.  She has previously declined colon cancer screening. Her son has had multiple advanced pathology polyps.  No other known family history of colon cancer or polyps, pancreatic cancer, gastric cancer, or endometrial or uterine cancer.   Past  Medical History:  Diagnosis Date   Arthritis    [place] [place]   bilateral upper extremity DVT found at time of thyroidectomy    History of kidney stones    Hyperlipidemia    Hypertension    Hypothyroidism (acquired) 12/2013   [facility]  Osteoporosis 07/15/2014   DEXA @ [facility] 07/14/14: -3.1   Thyroid goiter    s/p thyroidectomy - pathology demonstrated nodular hyperplasia    Past Surgical History:  Procedure Laterality Date   FEMUR IM NAIL Right 09/22/2013   Procedure: INTRAMEDULLARY (IM) NAIL FEMORAL;  Surgeon: JWylene Simmer MD;  [place] [facility]  Service: [place]ics;  Laterality: Right;   HIP SURGERY     THYROIDECTOMY WITH STERNOTOMY  12/21/13   thyroid mass found incidentally on CT scan 12/2013; [facility] of [facility]    Prior to Admission medications   Medication Sig Start Date End Date Taking? Authorizing Provider  amLODipine (NORVASC) 2.5 MG tablet Take 1 tablet (2.5 mg total) by mouth daily. 12/12/18   BBinnie Rail MD  aspirin 81 MG tablet Take 1 tablet (81 mg total) by mouth daily. 04/09/14   LRowe Clack MD  atorvastatin (LIPITOR) 10 MG tablet TAKE 1 TABLET BY MOUTH EVERY EVENING 11/28/18   BBinnie Rail MD  calcitRIOL (ROCALTROL) 0.25 MCG capsule TAKE 1 CAPSULE (0.25 MCG TOTAL) BY MOUTH 2 (TWO) TIMES DAILY. 01/15/19   Binnie Rail, MD  calcium carbonate (OS-CAL) 600 MG TABS tablet Take 1 tablet (600 mg total) by mouth 2 (two) times daily with a meal. 12/05/15   Burns, Claudina Lick, MD  Cholecalciferol (VITAMIN D3) 125 MCG (5000 UT) CAPS Take 1 capsule by mouth 2 (two) times daily.    [provider]  famotidine (PEPCID) 20 MG tablet TAKE 1 TABLET BY MOUTH EVERYDAY AT BEDTIME 02/12/19   Burns, Claudina Lick, MD  furosemide (LASIX) 20 MG tablet Take 1 tablet (20 mg total) by mouth daily. 12/12/18   Binnie Rail, MD  levothyroxine (SYNTHROID) 100 MCG  tablet Take 1 tab daily 6 days a week, 1.5 pills one day a week 12/12/18   Binnie Rail, MD  losartan (COZAAR) 100 MG tablet TAKE 1 TABLET BY MOUTH EVERY DAY 01/15/19   Binnie Rail, MD  metoprolol tartrate (LOPRESSOR) 25 MG tablet TAKE 1/2 TABLET BY MOUTH TWICE A DAY 02/12/19   Binnie Rail, MD  nystatin (MYCOSTATIN/NYSTOP) 100000 UNIT/GM POWD Apply to affected area 3 times a day 06/20/14   Rowe Clack, MD  potassium chloride SA (K-DUR) 20 MEQ tablet Take 1 tablet (20 mEq total) by mouth daily for 3 days. 03/01/19 03/11/19  Binnie Rail, MD  PROLIA 60 MG/ML SOSY injection  02/06/19   [provider]    Current Facility-Administered Medications  Medication Dose Route Frequency Provider Last Rate Last Dose   acetaminophen (TYLENOL) tablet 650 mg  650 mg Oral Q6H PRN Etta Quill, DO       Or   acetaminophen (TYLENOL) suppository 650 mg  650 mg Rectal Q6H PRN Etta Quill, DO       amLODipine (NORVASC) tablet 5 mg  5 mg Oral Daily Black, Karen M, NP   5 mg at 03/12/19 1129   cefTRIAXone (ROCEPHIN) 2 g in sodium chloride 0.9 % 100 mL IVPB  2 g Intravenous Q24H Jennette Kettle M, DO   Stopped at 03/12/19 0104   ferumoxytol (FERAHEME) 510 mg in sodium chloride 0.9 % 100 mL IVPB  510 mg Intravenous Once Domenic Polite, MD       lactulose ([place]) 10 GM/15ML solution 10 g  10 g Oral BID Etta Quill, DO   10 g at 03/12/19 1129   [START ON 03/13/2019] levothyroxine (SYNTHROID) tablet 100 mcg  100 mcg Oral QAC breakfast Radene Gunning, NP       metroNIDAZOLE (FLAGYL) IVPB 500 mg  500 mg Intravenous Q8H Jennette Kettle M, DO 100 mL/hr at 03/12/19 1515 500 mg at 03/12/19 1515   ondansetron (ZOFRAN) tablet 4 mg  4 mg Oral Q6H PRN Etta Quill, DO       Or   ondansetron [facility]) injection 4 mg  4 mg Intravenous Q6H PRN Etta Quill, DO       [START ON 03/13/2019] pantoprazole (PROTONIX) EC tablet 40 mg  40 mg Oral Q1200 Domenic Polite, MD        Allergies as  of 03/11/2019 - Review Complete 03/11/2019  Allergen Reaction Noted   Oxycodone Other (See Comments) 12/12/2013   Tetanus toxoid adsorbed Other (See Comments) 01/10/2014   Tetanus toxoids  12/29/2012    Family History  Problem Relation Age of Onset   Osteoarthritis Mother    Hypertension Mother    Hypertension Father  Cancer Father        unknown type   Diabetes Son    Diabetes Son    Clotting disorder Other    Rheumatologic disease Other     Social History   Socioeconomic History   Marital status: Widowed    Spouse name: Not on file   Number of children: 5   Years of education: Not on file   Highest education level: Not on file  Occupational History   Occupation: retired  Scientist, product/process development strain: Not hard at [employer]    Worry: Never true    Inability: Never true   Transportation needs    Medical: No    Non-medical: No  Tobacco Use   Smoking status: Never Smoker   Smokeless tobacco: Never Used   Tobacco comment: lives w/ dtr dorthea, 2nd dtr and 3 sons in town  Substance and Sexual Activity   Alcohol use: No    Alcohol/week: 0.0 standard drinks   Drug use: No   Sexual activity: Not Currently  Lifestyle   Physical activity    Days per week: 0 days    Minutes per session: 0 min   Stress: Not at all  Relationships   Social connections    Talks on phone: More than three times a week    Gets together: More than three times a week    Attends religious service: More than 4 times per year    Active member of club or organization: Yes    Attends meetings of clubs or organizations: More than 4 times per year    Relationship status: Widowed   Intimate partner violence    Fear of current or ex partner: Not on file    Emotionally abused: Not on file    Physically abused: Not on file    Forced sexual activity: Not on file  Other Topics Concern   Not on file  Social History Narrative   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   Lives with daughter dorthea - has 3 sons and another daughter in town, supportive    Review of Systems: 12 system ROS is negative except as noted above.   Physical Exam: Vital signs in last 24 hours: Temp:  [98.3 F (36.8 C)-99 F (37.2 C)] 98.3 F (36.8 C) (09/07 1327) Pulse Rate:  [87-94] 92 (09/07 1327) Resp:  [15-23] 18 (09/07 1327) BP: (117-136)/(47-58) 123/54 (09/07 1327) SpO2:  [96 %-100 %] 100 % (09/07 1327) Weight:  [113.4 kg] 113.4 kg (09/06 1706) Last BM Date: 03/10/19 General:   Alert, in NAD. Pleasant. Appears younger than her stated age.  HEENT: Scleral icterus present. No bilateral temporal wasting.  No xanthelasma. No lymphadenoma. No thyromegaly.  Heart:  Regular rate and rhythm; no murmurs Pulm: Clear anteriorly; no wheezing Abdomen:  Central obesity. Soft. Mild left sided abdominal pain. Nondistended. Normal bowel sounds. No rebound or guarding. No fluid wave. No Murphy's sign.   LAD: No inguinal or umbilical LAD Extremities:  2+ LE edema.  Neurologic:  Alert and  oriented x4;  grossly normal neurologically; no asterixis or clonus. Skin: No jaundice. Mild palmar erythema. No spider angioma.  Psych:  Alert and cooperative. Normal mood and affect. Musk: No boney deformities or effusions.    Lab Results: Recent Labs    03/11/19 2040 03/12/19 0055 03/12/19 0231  WBC 12.4*  --  6.5  HGB 8.2*  9.2* 7.3*  HCT 25.9* 27.0* 23.2*  PLT 248  --  PLATELET CLUMPS NOTED ON SMEAR, UNABLE TO ESTIMATE   BMET Recent Labs    03/11/19 2040 03/12/19 0055 03/12/19 0231  NA 130* 133* 133*  K 4.9 4.7 4.9  CL 102  --  105  CO2 18*  --  14*  GLUCOSE 98  --  93  BUN 39*  --  36*  CREATININE 2.13*  --  2.05*  CALCIUM 8.0*  --  7.8*   LFT Recent Labs    03/12/19 0231 03/12/19 1214  PROT 6.3*  --   ALBUMIN 2.2*  --   AST 102*  --   ALT 39  --   ALKPHOS 606*  --   BILITOT 8.2*  7.8*  BILIDIR  --  4.9*  IBILI  --  2.9*   PT/INR Recent Labs    03/11/19 2208  LABPROT 18.3*  INR 1.5*   Hepatitis Panel No results for input(s): HEPBSAG, HCVAB, HEPAIGM, HEPBIGM in the last 72 hours.    Studies/Results: Dg Chest Portable 1 View  Result Date: 03/11/2019 CLINICAL DATA:  Shortness of breath beginning today, has not been eating or drinking since September 26, 2022 when son passed away, nausea, weakness, history hypertension, hyperlipidemia EXAM: PORTABLE CHEST 1 VIEW COMPARISON:  Portable exam 1940 hours compared to 09/12/2014 FINDINGS: Upper normal heart size post median sternotomy. Mediastinal contours and pulmonary vascularity normal. Atherosclerotic calcification aorta. Chronic eventration of the RIGHT diaphragm. Bibasilar atelectasis. No infiltrate, pleural effusion or pneumothorax. Osseous demineralization with advanced degenerative changes of BILATERAL glenohumeral joints. IMPRESSION: Bibasilar atelectasis. Electronically Signed   By: Lavonia Dana M.D.   On: 03/11/2019 19:51   Ct Renal Stone Study  Result Date: 03/11/2019 CLINICAL DATA:  Left flank pain EXAM: CT ABDOMEN AND PELVIS WITHOUT CONTRAST TECHNIQUE: Multidetector CT imaging of the abdomen and pelvis was performed following the standard protocol without IV contrast. COMPARISON:  None. FINDINGS: Lower chest: No acute abnormality. Hepatobiliary: Nodular contours within the liver compatible with cirrhosis. There are ill-defined low-density areas within the liver, cannot exclude focal masses. Gallbladder is distended and appears to be filled with gallstones. No biliary ductal dilatation. Pancreas: No focal abnormality or ductal dilatation. Spleen: No focal abnormality.  Normal size. Adrenals/Urinary Tract: Bilateral renal cysts. No renal or ureteral stones. No hydronephrosis. Urinary bladder and adrenal glands are unremarkable. Stomach/Bowel: Colonic diverticulosis, most pronounced in the descending colon and sigmoid colon.  Inflammatory stranding around the mid descending colon compatible with active diverticulitis. Stomach and small bowel decompressed, grossly unremarkable. Appendix normal. Vascular/Lymphatic: Aortic atherosclerosis. No enlarged abdominal or pelvic lymph nodes. Reproductive: Uterus and adnexa unremarkable.  No mass. Other: No free fluid or free air. Musculoskeletal: No acute bony abnormality. IMPRESSION: Changes of cirrhosis. There are scattered ill-defined low-density areas within the liver. This could reflect geographic fatty infiltration, but focal hepatic masses/lesions cannot be excluded on this unenhanced study. This could be further evaluated with contrast-enhanced CT or MRI. Gallbladder appears to be distended and filled with gallstones. Colonic diverticulosis. Inflammatory stranding around the mid descending colon compatible with active diverticulitis. Aortic atherosclerosis. No renal or ureteral stones.  No hydronephrosis. Electronically Signed   By: Rolm Baptise M.D.   On: 03/11/2019 23:20   US Abdomen Limited Ruq  Result Date: 03/12/2019 CLINICAL DATA:  Initial evaluation for elevated LFTs, abnormal CT. EXAM: ULTRASOUND ABDOMEN LIMITED RIGHT UPPER QUADRANT COMPARISON:  Prior CT from 03/11/2019. FINDINGS: Gallbladder: Gallbladder distended with multiple internal shadowing echogenic stones, largest  of which measures approximately 6 mm. A Wes sign is present. Gallbladder wall measures at the upper limits of normal at 3 mm in thickness. No free pericholecystic fluid. No sonographic Murphy sign elicited on exam. Common bile duct: Diameter: 6 mm Liver: Liver demonstrates a heterogeneous echotexture with nodular contour, consistent with cirrhosis. No focal intrahepatic mass identified by sonography. Portal vein is patent on color Doppler imaging with normal direction of blood flow towards the liver. Other: Incidental note made of 2 small simple cysts within the right kidney, also seen on prior CT. IMPRESSION:  1. Distended gallbladder filled with stones. No other sonographic features to suggest acute cholecystitis. 2. No biliary dilatation. 3. Morphologic changes consistent with cirrhosis. No discrete intrahepatic mass evident by sonography. Electronically Signed   By: Jeannine Boga M.D.   On: 03/12/2019 00:25      Karmin Kasprzak L. Tarri Glenn, MD, MPH 03/12/2019, 3:43 PM

## 2019-03-12 NOTE — Progress Notes (Addendum)
[facility] PROGRESS NOTE  Shawna Fuentes QDI:[id number] DOB: January 11, 1933 DOA: 03/11/2019 PCP: Binnie Rail, MD  Assessment/Plan:  1. Acute diverticulitis - less pain this am. Leukocytosis resolved this am 1. Rocephin / flagyl 2. monitor 2. AKI on CKD stage 3. Appears baseline creatinine 1.2. creatinine 2.13 on admission and 2.05 this am.  1. Suspect Mildly dehydrated from diverticulitis, and recently increased lasix dose: 1. Holding lasix 2. Gentle hydration with NS at 75 3. Strict intake and output 4. monitor 2. Cirrhosis of liver - elevated alk phos and ast as well as total bili. Ammonia elevated. 3. New diagnosis 4. follow hepatitis pnl 5. RUQ [place]: 1. Cirrhosis 2. No obvious masses 6. CT scan: 1. Had ? Of masses vs nodular appearance of cirrhosis. 2. May need liver MRI, but will defer to GI for now 7. Have requested gi consult 8. Also hyperammonemia: 1. continue lactulose 10 BID 9. No mention of ascites in abdomen on imaging today. 3. Anemia - 1. Trace hemoccult positive 2. Repeat CBC in am 3. Have requested gi consult 4. Chronic respiratory failure with hypercapnia - 1. Due to OSA  Cholethithiasis. [place] of abdomen with distended gallbladder filled with stones. No acute cholecystitis  Hypertension. Fair control. Home meds include norvasc, cozaar and lasix -holing lasix and cozaar -continue norvasc  Code Status: full Family Communication: patient Disposition Plan: home when ready hopefully tomorrow   Consultants:  [facility]  Procedures:    Antibiotics:  Rocephin  flagyl  HPI/Subjective: Lying in bed. Reports abdominal pain much improved  Objective: Vitals:   03/12/19 0200 03/12/19 0533  BP:  (!) 117/52  Pulse:  94  Resp:  17  Temp: 98.8 F (37.1 C) 98.5 F (36.9 C)  SpO2:  100%    Intake/Output Summary (Last 24 hours) at 03/12/2019 0955 Last data filed at 03/12/2019 0532 Gross per 24 hour  Intake 617.52 ml  Output 250 ml  Net 367.52 ml    Filed Weights   03/11/19 1706  Weight: 113.4 kg    Exam:   General:  Obese alert no acute distress  Cardiovascular: rrr no mgr 1+ LE edema  Respiratory: mild increased work of breathing bs clear  Abdomen: obese soft +BS non-tender to palpation  Musculoskeletal: joints without swelling/erythema   Data Reviewed: Basic Metabolic Panel: Recent Labs  Lab 03/11/19 2040 03/12/19 0055 03/12/19 0231  NA 130* 133* 133*  K 4.9 4.7 4.9  CL 102  --  105  CO2 18*  --  14*  GLUCOSE 98  --  93  BUN 39*  --  36*  CREATININE 2.13*  --  2.05*  CALCIUM 8.0*  --  7.8*   Liver Function Tests: Recent Labs  Lab 03/11/19 2040 03/12/19 0231  AST 106* 102*  ALT 43 39  ALKPHOS 542* 606*  BILITOT 8.0* 8.2*  PROT 6.4* 6.3*  ALBUMIN 2.3* 2.2*   Recent Labs  Lab 03/11/19 2040  LIPASE 77*   Recent Labs  Lab 03/11/19 2208  AMMONIA 65*   CBC: Recent Labs  Lab 03/11/19 2040 03/12/19 0055 03/12/19 0231  WBC 12.4*  --  6.5  NEUTROABS 9.9*  --   --   HGB 8.2* 9.2* 7.3*  HCT 25.9* 27.0* 23.2*  MCV 83.3  --  85.3  PLT 248  --  PLATELET CLUMPS NOTED ON SMEAR, UNABLE TO ESTIMATE   Cardiac Enzymes: No results for input(s): CKTOTAL, CKMB, CKMBINDEX, TROPONINI in the last 168 hours. BNP (last 3 results)  Recent Labs    03/11/19 2040  BNP 56.7    ProBNP (last 3 results) No results for input(s): PROBNP in the last 8760 hours.  CBG: No results for input(s): GLUCAP in the last 168 hours.  Recent Results (from the past 240 hour(s))  SARS Coronavirus 2 [facility] order, Performed in [facility] hospital lab) Nasopharyngeal Nasopharyngeal Swab     Status: None   Collection Time: 03/11/19  8:49 PM   Specimen: Nasopharyngeal Swab  Result Value Ref Range Status   SARS Coronavirus 2 NEGATIVE NEGATIVE Final    Comment: (NOTE) If result is NEGATIVE SARS-CoV-2 target nucleic acids are NOT DETECTED. The SARS-CoV-2 RNA is generally detectable in upper and lower  respiratory  specimens during the acute phase of infection. The lowest  concentration of SARS-CoV-2 viral copies this assay can detect is 250  copies / mL. A negative result does not preclude SARS-CoV-2 infection  and should not be used as the sole basis for treatment or other  patient management decisions.  A negative result may occur with  improper specimen collection / handling, submission of specimen other  than nasopharyngeal swab, presence of viral mutation(s) within the  areas targeted by this assay, and inadequate number of viral copies  (<250 copies / mL). A negative result must be combined with clinical  observations, patient history, and epidemiological information. If result is POSITIVE SARS-CoV-2 target nucleic acids are DETECTED. The SARS-CoV-2 RNA is generally detectable in upper and lower  respiratory specimens dur ing the acute phase of infection.  Positive  results are indicative of active infection with SARS-CoV-2.  Clinical  correlation with patient history and other diagnostic information is  necessary to determine patient infection status.  Positive results do  not rule out bacterial infection or co-infection with other viruses. If result is PRESUMPTIVE POSTIVE SARS-CoV-2 nucleic acids MAY BE PRESENT.   A presumptive positive result was obtained on the submitted specimen  and confirmed on repeat testing.  While 2019 novel coronavirus  (SARS-CoV-2) nucleic acids may be present in the submitted sample  additional confirmatory testing may be necessary for epidemiological  and / or clinical management purposes  to differentiate between  SARS-CoV-2 and other Sarbecovirus currently known to infect humans.  If clinically indicated additional testing with an alternate test  methodology [phone]) is advised. The SARS-CoV-2 RNA is generally  detectable in upper and lower respiratory sp ecimens during the acute  phase of infection. The expected result is Negative. Fact Sheet for  Patients:  StrictlyIdeas.no Fact Sheet for Healthcare Providers: BankingDealers.co.za This test is not yet approved or cleared by the [place] FDA and has been authorized for detection and/or diagnosis of SARS-CoV-2 by FDA under an Emergency Use Authorization (EUA).  This EUA will remain in effect (meaning this test can be used) for the duration of the COVID-19 declaration under Section 564(b)(1) of the Act, 21 U.S.C. section 360bbb-3(b)(1), unless the authorization is terminated or revoked sooner. Performed at [facility], [address], [place], [place] [address]      Studies: Dg Chest Portable 1 View  Result Date: 03/11/2019 CLINICAL DATA:  Shortness of breath beginning today, has not been eating or drinking since 09-27-22 when son passed away, nausea, weakness, history hypertension, hyperlipidemia EXAM: PORTABLE CHEST 1 VIEW COMPARISON:  Portable exam 1940 hours compared to 09/12/2014 FINDINGS: Upper normal heart size post median sternotomy. Mediastinal contours and pulmonary vascularity normal. Atherosclerotic calcification aorta. Chronic eventration of the RIGHT diaphragm. Bibasilar atelectasis. No  infiltrate, pleural effusion or pneumothorax. Osseous demineralization with advanced degenerative changes of BILATERAL glenohumeral joints. IMPRESSION: Bibasilar atelectasis. Electronically Signed   By: Lavonia Dana M.D.   On: 03/11/2019 19:51   Ct Renal Stone Study  Result Date: 03/11/2019 CLINICAL DATA:  Left flank pain EXAM: CT ABDOMEN AND PELVIS WITHOUT CONTRAST TECHNIQUE: Multidetector CT imaging of the abdomen and pelvis was performed following the standard protocol without IV contrast. COMPARISON:  None. FINDINGS: Lower chest: No acute abnormality. Hepatobiliary: Nodular contours within the liver compatible with cirrhosis. There are ill-defined low-density areas within the liver, cannot exclude focal masses. Gallbladder is distended  and appears to be filled with gallstones. No biliary ductal dilatation. Pancreas: No focal abnormality or ductal dilatation. Spleen: No focal abnormality.  Normal size. Adrenals/Urinary Tract: Bilateral renal cysts. No renal or ureteral stones. No hydronephrosis. Urinary bladder and adrenal glands are unremarkable. Stomach/Bowel: Colonic diverticulosis, most pronounced in the descending colon and sigmoid colon. Inflammatory stranding around the mid descending colon compatible with active diverticulitis. Stomach and small bowel decompressed, grossly unremarkable. Appendix normal. Vascular/Lymphatic: Aortic atherosclerosis. No enlarged abdominal or pelvic lymph nodes. Reproductive: Uterus and adnexa unremarkable.  No mass. Other: No free fluid or free air. Musculoskeletal: No acute bony abnormality. IMPRESSION: Changes of cirrhosis. There are scattered ill-defined low-density areas within the liver. This could reflect geographic fatty infiltration, but focal hepatic masses/lesions cannot be excluded on this unenhanced study. This could be further evaluated with contrast-enhanced CT or MRI. Gallbladder appears to be distended and filled with gallstones. Colonic diverticulosis. Inflammatory stranding around the mid descending colon compatible with active diverticulitis. Aortic atherosclerosis. No renal or ureteral stones.  No hydronephrosis. Electronically Signed   By: Rolm Baptise M.D.   On: 03/11/2019 23:20   US Abdomen Limited Ruq  Result Date: 03/12/2019 CLINICAL DATA:  Initial evaluation for elevated LFTs, abnormal CT. EXAM: ULTRASOUND ABDOMEN LIMITED RIGHT UPPER QUADRANT COMPARISON:  Prior CT from 03/11/2019. FINDINGS: Gallbladder: Gallbladder distended with multiple internal shadowing echogenic stones, largest of which measures approximately 6 mm. A Wes sign is present. Gallbladder wall measures at the upper limits of normal at 3 mm in thickness. No free pericholecystic fluid. No sonographic Murphy sign  elicited on exam. Common bile duct: Diameter: 6 mm Liver: Liver demonstrates a heterogeneous echotexture with nodular contour, consistent with cirrhosis. No focal intrahepatic mass identified by sonography. Portal vein is patent on color Doppler imaging with normal direction of blood flow towards the liver. Other: Incidental note made of 2 small simple cysts within the right kidney, also seen on prior CT. IMPRESSION: 1. Distended gallbladder filled with stones. No other sonographic features to suggest acute cholecystitis. 2. No biliary dilatation. 3. Morphologic changes consistent with cirrhosis. No discrete intrahepatic mass evident by sonography. Electronically Signed   By: Jeannine Boga M.D.   On: 03/12/2019 00:25    Scheduled Meds: . lactulose  10 g Oral BID   Continuous Infusions: . sodium chloride 75 mL/hr at 03/12/19 0336  . cefTRIAXone (ROCEPHIN)  IV Stopped (03/12/19 0104)  . metronidazole 500 mg (03/12/19 7412)    Principal Problem:   Acute diverticulitis Active Problems:   Hyperammonemia (HCC)   Cirrhosis of liver (HCC)   Cholelithiasis   Anemia   Acute renal failure superimposed on stage 3 chronic kidney disease (HCC)   Hypertension   Edema, bilateral legs   Chronic respiratory failure with hypercapnia (HCC)   Obstructive sleep apnea    Time spent: 45 minutes    Radene Gunning NP  [facility]  If 7PM-7AM, please contact night-coverage at [email or web address], password [facility] 03/12/2019, 9:55 AM  LOS: 0 days

## 2019-03-12 NOTE — Progress Notes (Signed)
  Echocardiogram 2D Echocardiogram has been performed.  Shawna Fuentes 03/12/2019, 11:13 AM

## 2019-03-13 ENCOUNTER — Ambulatory Visit: Payer: Medicare HMO | Admitting: Internal Medicine

## 2019-03-13 DIAGNOSIS — R945 Abnormal results of liver function studies: Secondary | ICD-10-CM

## 2019-03-13 DIAGNOSIS — R7989 Other specified abnormal findings of blood chemistry: Secondary | ICD-10-CM

## 2019-03-13 LAB — COMPREHENSIVE METABOLIC PANEL
ALT: 36 U/L (ref 0–44)
AST: 98 U/L — ABNORMAL HIGH (ref 15–41)
Albumin: 2 g/dL — ABNORMAL LOW (ref 3.5–5.0)
Alkaline Phosphatase: 579 U/L — ABNORMAL HIGH (ref 38–126)
Anion gap: 9 (ref 5–15)
BUN: 32 mg/dL — ABNORMAL HIGH (ref 8–23)
CO2: 18 mmol/L — ABNORMAL LOW (ref 22–32)
Calcium: 7.5 mg/dL — ABNORMAL LOW (ref 8.9–10.3)
Chloride: 106 mmol/L (ref 98–111)
Creatinine, Ser: 1.67 mg/dL — ABNORMAL HIGH (ref 0.44–1.00)
GFR calc Af Amer: 32 mL/min — ABNORMAL LOW (ref >60)
GFR calc non Af Amer: 28 mL/min — ABNORMAL LOW (ref >60)
Glucose, Bld: 80 mg/dL (ref 70–99)
Potassium: 4.8 mmol/L (ref 3.5–5.1)
Sodium: 133 mmol/L — ABNORMAL LOW (ref 135–145)
Total Bilirubin: 8.2 mg/dL — ABNORMAL HIGH (ref 0.3–1.2)
Total Protein: 5.9 g/dL — ABNORMAL LOW (ref 6.5–8.1)

## 2019-03-13 LAB — HEPATITIS PANEL, ACUTE
HCV Ab: <0.1 s/co ratio (ref 0.0–0.9)
HCV Ab: <0.1 s/co ratio (ref 0.0–0.9)
Hep A IgM: NEGATIVE
Hep A IgM: NEGATIVE
Hep B C IgM: NEGATIVE
Hep B C IgM: NEGATIVE
Hepatitis B Surface Ag: NEGATIVE
Hepatitis B Surface Ag: NEGATIVE

## 2019-03-13 LAB — OCCULT BLOOD X 1 CARD TO LAB, STOOL: Fecal Occult Bld: NEGATIVE

## 2019-03-13 LAB — CBC
HCT: 23.9 % — ABNORMAL LOW (ref 36.0–46.0)
Hemoglobin: 7.9 g/dL — ABNORMAL LOW (ref 12.0–15.0)
MCH: 26.7 pg (ref 26.0–34.0)
MCHC: 33.1 g/dL (ref 30.0–36.0)
MCV: 80.7 fL (ref 80.0–100.0)
Platelets: 258 10*3/uL (ref 150–400)
RBC: 2.96 MIL/uL — ABNORMAL LOW (ref 3.87–5.11)
RDW: 17.2 % — ABNORMAL HIGH (ref 11.5–15.5)
WBC: 11.1 10*3/uL — ABNORMAL HIGH (ref 4.0–10.5)
nRBC: 0 % (ref 0.0–0.2)

## 2019-03-13 LAB — GAMMA GT: GGT: 862 U/L — ABNORMAL HIGH (ref 7–50)

## 2019-03-13 LAB — HEPATITIS C ANTIBODY: HCV Ab: <0.1 s/co ratio (ref 0.0–0.9)

## 2019-03-13 LAB — HAPTOGLOBIN: Haptoglobin: 271 mg/dL (ref 41–333)

## 2019-03-13 MED ORDER — WHITE PETROLATUM EX OINT
TOPICAL_OINTMENT | CUTANEOUS | Status: AC
  Administered 2019-03-13: 0.2
  Filled 2019-03-13: qty 28.35

## 2019-03-13 NOTE — Progress Notes (Signed)
Patient has refused CPAP for tonight.  No distress noted.  Will continue to monitor.

## 2019-03-13 NOTE — TOC Initial Note (Signed)
Transition of Care [facility]) - Initial/Assessment Note    Patient Details  Name: Shawna Fuentes MRN: [id number] Date of Birth: 04-22-1933  Transition of Care [facility]) CM/SW Contact:    Benard Halsted, LCSW Phone Number: 03/13/2019, 4:48 PM  Clinical Narrative:                 CSW received consult for possible SNF placement at time of discharge. CSW spoke with patient regarding PT recommendation of SNF placement at time of discharge and she gave CSW permission to speak with her son at bedside Izell Craven). Izell [place] reports that they would like to discuss the discharge options with the rest of the family and will get back to CSW. CSW to continue to follow and assist with discharge planning needs.     Barriers to Discharge: Continued Medical Work up, Ship broker   Patient Goals and CMS Choice Patient states their goals for this hospitalization and ongoing recovery are:: Return home   Choice offered to / list presented to : Patient, Adult Children(Son Izell [place])  Expected Discharge Plan and Services   In-house Referral: Clinical Social Work Discharge Planning Services: NA   Living arrangements for the past 2 months: Single Family Home                 DME Arranged: N/A DME Agency: NA       HH Arranged: NA HH Agency: NA        Prior Living Arrangements/Services Living arrangements for the past 2 months: Single Family Home Lives with:: Adult Children Patient language and need for interpreter reviewed:: Yes Do you feel safe going back to the place where you live?: Yes      Need for Family Participation in Patient Care: Yes (Comment) Care giver support system in place?: Yes (comment)   Criminal Activity/Legal Involvement Pertinent to Current Situation/Hospitalization: No - Comment as needed  Activities of Daily Living      Permission Sought/Granted Permission sought to share information with : Facility Sport and exercise psychologist, Family Supports Permission granted to share  information with : Yes, Verbal Permission Granted  Share Information with NAME: Izell [place]     Permission granted to share info w Relationship: Son     Emotional Assessment Appearance:: Appears stated age Attitude/Demeanor/Rapport: (Appropriate) Affect (typically observed): Accepting Orientation: : Oriented to Self, Oriented to Place, Oriented to  Time, Oriented to Situation Alcohol / Substance Use: Not Applicable Psych Involvement: No (comment)  Admission diagnosis:  Hyperbilirubinemia [E80.6] Diverticulitis [K57.92] Hypoalbuminemia [E88.09] Peripheral edema [R60.9] Elevated LFTs [R94.5] Symptomatic anemia [D64.9] Anemia, unspecified type [D64.9] Acute renal failure superimposed on chronic kidney disease, unspecified CKD stage, unspecified acute renal failure type ([place]) [N17.9, N18.9] Patient Active Problem List   Diagnosis Date Noted  . Elevated LFTs   . Hyperbilirubinemia   . Acute diverticulitis 03/12/2019  . Hyperammonemia ([place]) 03/12/2019  . Cirrhosis of liver ([place]) 03/12/2019  . Cholelithiasis 03/12/2019  . Anemia 03/12/2019  . Acute renal failure superimposed on stage 3 chronic kidney disease ([place]) 03/12/2019  . Pain and swelling of right lower leg 03/13/2018  . Prediabetes 12/04/2017  . Cough 07/10/2016  . CKD (chronic kidney disease) stage 3, GFR 30-59 ml/min (HCC) 06/08/2016  . Urinary incontinence 05/13/2016  . Obstructive sleep apnea 09/15/2014  . Osteoporosis 07/15/2014  . Arthritis pain of hand 07/10/2014  . Deep vein thrombosis ([place]) 12/24/2013  . Chronic respiratory failure with hypercapnia ([place]) 12/21/2013  . Post-surgical hypothyroidism 12/21/2013  . Hypoxemia 12/20/2013  . Edema, bilateral  legs 12/12/2013  . Fracture, subtrochanteric, right femur, closed ([place]) 09/22/2013  . Chronic radicular low back pain 12/30/2012  . Osteoarthritis of right knee 12/30/2012  . Hyperlipidemia   . Hypertension    PCP:  Binnie Rail, MD Pharmacy:   [id number] - [place], [place] [address] Phone: [phone] Fax: [phone]     Social Determinants of Health ([place]) Interventions    Readmission Risk Interventions Readmission Risk Prevention Plan 03/13/2019  Transportation Screening Complete  PCP or Specialist Appt within 5-7 Days Complete  Home Care Screening Complete  Medication Review (RN CM) Complete  Some recent data might be hidden

## 2019-03-13 NOTE — Evaluation (Signed)
Occupational Therapy Evaluation Patient Details Name: Shawna Fuentes MRN: [id number] DOB: [birth year] Today's Date: 03/13/2019    History of Present Illness 85 year old female presents from home with decline in function & diverticulitis, acute kidney disease and cirrhosis. Pt with significiant PMH of OSA, hypertension, dyslipidemia, andhypothyroidism.   Clinical Impression   This 85 yo female admitted with above presents to acute OT with decreased mobility and decreased balance thus affecting her PLOF of being able to do her own basic ADLs mostly with occasional A from family/PCA. Now she is setup-S/Max A for basic ADLs. She will benefit from acute OT with follow up on CIR.    Follow Up Recommendations  CIR;Supervision - Intermittent    Equipment Recommendations  None recommended by OT       Precautions / Restrictions Precautions Precautions: Fall Restrictions Weight Bearing Restrictions: No      Mobility Bed Mobility Overal bed mobility: Needs Assistance Bed Mobility: Supine to Sit;Sit to Supine     Supine to sit: Min guard;HOB elevated(use or rails and increased time) Sit to supine: Mod assist(A for legs)   General bed mobility comments: Mod A to reposition in bed  Transfers Overall transfer level: Needs assistance Equipment used: Rolling walker (2 wheeled) Transfers: Sit to/from Stand Sit to Stand: Min assist         General transfer comment: increased time from bed and 3n1    Balance Overall balance assessment: Needs assistance Sitting-balance support: No upper extremity supported;Feet supported Sitting balance-Leahy Scale: Good     Standing balance support: Bilateral upper extremity supported;During functional activity Standing balance-Leahy Scale: Poor                             ADL either performed or assessed with clinical judgement   ADL Overall ADL's : Needs assistance/impaired Eating/Feeding: Independent;Sitting   Grooming: Set  up;Sitting   Upper Body Bathing: Minimal assistance;Sitting   Lower Body Bathing: Maximal assistance Lower Body Bathing Details (indicate cue type and reason): min A sit<>stand Upper Body Dressing : Set up;Sitting   Lower Body Dressing: Maximal assistance Lower Body Dressing Details (indicate cue type and reason): without AE, min A sit<>stand Toilet Transfer: Minimal assistance;Stand-pivot;RW;BSC   Toileting- Clothing Manipulation and Hygiene: Total assistance Toileting - Clothing Manipulation Details (indicate cue type and reason): min A sit<>stand             Vision Patient Visual Report: No change from baseline              Pertinent Vitals/Pain Pain Assessment: No/denies pain     Hand Dominance Right      Communication Communication Communication: No difficulties   Cognition Arousal/Alertness: Awake/alert Behavior During Therapy: WFL for tasks assessed/performed Overall Cognitive Status: Within Functional Limits for tasks assessed                                                Home Living Family/patient expects to be discharged to:: Private residence Living Arrangements: Children(lives with dtr) Available Help at Discharge: Personal care attendant;Family;Available PRN/intermittently Type of Home: House Home Access: Ramped entrance     Home Layout: One level     Bathroom Shower/Tub: Occupational psychologist: Standard     Home Equipment: Radiation protection practitioner Equipment: Reacher;Sock aid;Long-handled shoe horn;Long-handled sponge Additional Comments:  uses w/c for community access      Prior Functioning/Environment Level of Independence: Independent with assistive device(s);Needs assistance  Gait / Transfers Assistance Needed: Mod I with RW for all mobility ADL's / Homemaking Assistance Needed: A in and out of shower but reports she can bath/dress/toliet herself            OT Problem List: Decreased  strength;Decreased range of motion;Impaired balance (sitting and/or standing);Obesity      OT Treatment/Interventions: Self-care/ADL training;DME and/or AE instruction;Patient/family education;Balance training    OT Goals(Current goals can be found in the care plan section) Acute Rehab OT Goals Patient Stated Goal: to get back home and be able to take care of myself as I was before OT Goal Formulation: With patient Time For Goal Achievement: 03/27/19 Potential to Achieve Goals: Good  OT Frequency: Min 2X/week   Barriers to D/C: Decreased caregiver support(currently does not have 24/7 care)             AM-PAC OT "6 Clicks" Daily Activity     Outcome Measure Help from another person eating meals?: None Help from another person taking care of personal grooming?: A Little Help from another person toileting, which includes using toliet, bedpan, or urinal?: A Lot Help from another person bathing (including washing, rinsing, drying)?: A Lot Help from another person to put on and taking off regular upper body clothing?: A Little Help from another person to put on and taking off regular lower body clothing?: A Lot 6 Click Score: 16   End of Session Equipment Utilized During Treatment: Gait belt;Rolling walker Nurse Communication: (called Financial controller to have them let NT know pt needed purewick put back in)  Activity Tolerance: (I just feel so weak) Patient left: in bed;with call bell/phone within reach;with chair alarm set  OT Visit Diagnosis: Unsteadiness on feet (R26.81);Other abnormalities of gait and mobility (R26.89);Muscle weakness (generalized) (M62.81)                Time: [id number] OT Time Calculation (min): 42 min Charges:  OT General Charges $OT Visit: 1 Visit OT Evaluation $OT Eval Moderate Complexity: 1 Mod OT Treatments $Self Care/Home Management : 23-37 mins  [address], OTR/L Acute [employer] Pager [phone] Office [phone]     Almon Register 03/13/2019, 4:17 PM

## 2019-03-13 NOTE — Progress Notes (Signed)
Rehab Admissions Coordinator Note:  Patient was screened by Cleatrice Burke for appropriateness for an Inpatient Acute Rehab Consult per OT recs. PT recommends SNF. Noted recent death of son with decline over past week. [employer] Medicare unlikely to approve an inpt rehab admit for pt's current diagnosis, but if daughter would like to pursue admit, place rehab consult for full assessment. Please advise.   Cleatrice Burke RN MSN 03/13/2019, 4:34 PM  I can be reached at [phone].

## 2019-03-13 NOTE — NC FL2 (Signed)
[place] LEVEL OF CARE SCREENING TOOL     IDENTIFICATION  Patient Name: Shawna Fuentes Birthdate: 06-Feb-1933 Sex: female Admission Date (Current Location): 03/11/2019  [place] and [place] Number:  Shawna Fuentes [id number] Shawna Fuentes and Address:  [facility] [place], [place] [address] [place], [place] [address]      Provider Number: [id number]  Attending Physician Name and Address:  Domenic Polite, MD  Relative Name and Phone Number:  Chalena Poppy; [phone] son    Current Level of Care: Hospital Recommended Level of Care: [place] Prior Approval Number:    Date Approved/Denied:   PASRR Number: [id number]  Discharge Plan: SNF    Current Diagnoses: Patient Active Problem List   Diagnosis Date Noted  . Elevated LFTs   . Hyperbilirubinemia   . Acute diverticulitis 03/12/2019  . Hyperammonemia ([place]) 03/12/2019  . Cirrhosis of liver ([place]) 03/12/2019  . Cholelithiasis 03/12/2019  . Anemia 03/12/2019  . Acute renal failure superimposed on stage 3 chronic kidney disease ([place]) 03/12/2019  . Pain and swelling of right lower leg 03/13/2018  . Prediabetes 12/04/2017  . Cough 07/10/2016  . CKD (chronic kidney disease) stage 3, GFR 30-59 ml/min (HCC) 06/08/2016  . Urinary incontinence 05/13/2016  . Obstructive sleep apnea 09/15/2014  . Osteoporosis 07/15/2014  . Arthritis pain of hand 07/10/2014  . Deep vein thrombosis ([place]) 12/24/2013  . Chronic respiratory failure with hypercapnia ([place]) 12/21/2013  . Post-surgical hypothyroidism 12/21/2013  . Hypoxemia 12/20/2013  . Edema, bilateral legs 12/12/2013  . Fracture, subtrochanteric, right femur, closed ([place]) 09/22/2013  . Chronic radicular low back pain 12/30/2012  . Osteoarthritis of right knee 12/30/2012  . Hyperlipidemia   . Hypertension     Orientation RESPIRATION BLADDER Height & Weight     Self, Time, Situation, Place  Normal Continent, External catheter Weight: 250 lb  (113.4 kg) Height:  5\' 3"  (160 cm)  BEHAVIORAL SYMPTOMS/MOOD NEUROLOGICAL BOWEL NUTRITION STATUS      Continent Diet(see discharge summary)  AMBULATORY STATUS COMMUNICATION OF NEEDS Skin   Extensive Assist Verbally Normal                       Personal Care Assistance Level of Assistance  Bathing, Feeding, Dressing Bathing Assistance: Maximum assistance Feeding assistance: Independent Dressing Assistance: Maximum assistance     Functional Limitations Info  Speech, Hearing, Sight Sight Info: Adequate Hearing Info: Adequate Speech Info: Adequate    SPECIAL CARE FACTORS FREQUENCY  PT (By licensed PT), OT (By licensed OT)     PT Frequency: 5x week OT Frequency: 5x week            Contractures Contractures Info: Not present    Additional Factors Info  Code Status, Allergies Code Status Info: Full Code Allergies Info: Oxycodone, Tetanus Toxoid Adsorbed, Tetanus Toxoids           Current Medications (03/13/2019):  This is the current hospital active medication list Current Facility-Administered Medications  Medication Dose Route Frequency Provider Last Rate Last Dose  . acetaminophen (TYLENOL) tablet 650 mg  650 mg Oral Q6H PRN Etta Quill, DO       Or  . acetaminophen (TYLENOL) suppository 650 mg  650 mg Rectal Q6H PRN Etta Quill, DO      . amLODipine (NORVASC) tablet 5 mg  5 mg Oral Daily Radene Gunning, NP   5 mg at 03/13/19 0954  . cefTRIAXone (ROCEPHIN) 2 g in sodium chloride 0.9 %  100 mL IVPB  2 g Intravenous Q24H Jennette Kettle M, DO 200 mL/hr at 03/13/19 0315 2 g at 03/13/19 0315  . lactulose (CHRONULAC) 10 GM/15ML solution 10 g  10 g Oral BID Jennette Kettle M, DO   10 g at 03/13/19 0954  . levothyroxine (SYNTHROID) tablet 100 mcg  100 mcg Oral QAC breakfast Radene Gunning, NP      . metroNIDAZOLE (FLAGYL) IVPB 500 mg  500 mg Intravenous Q8H Jennette Kettle M, DO 100 mL/hr at 03/13/19 0802 500 mg at 03/13/19 0802  . ondansetron (ZOFRAN) tablet 4 mg   4 mg Oral Q6H PRN Etta Quill, DO       Or  . ondansetron [facility]) injection 4 mg  4 mg Intravenous Q6H PRN Etta Quill, DO   4 mg at 03/13/19 0836  . pantoprazole (PROTONIX) EC tablet 40 mg  40 mg Oral Q1200 Domenic Polite, MD   40 mg at 03/13/19 1210     Discharge Medications: Please see discharge summary for a list of discharge medications.  Relevant Imaging Results:  Relevant Lab Results:   Additional Information SS# [place], [place]

## 2019-03-13 NOTE — Progress Notes (Signed)
Daily Rounding Note  03/13/2019, 8:22 AM  LOS: 1 day   SUBJECTIVE:   Chief complaint:   Diverticulitis.  Elevated LFTs, new dx cirrhosis.    Chest burning and nausea without vomiting this AM after drinking cranberry juice, new sxs for her.  No radiation of pain.  No SOB  OBJECTIVE:         Vital signs in last 24 hours:    Temp:  [98.3 F (36.8 C)-99.3 F (37.4 C)] 98.9 F (37.2 C) (09/08 0524) Pulse Rate:  [90-94] 90 (09/08 0524) Resp:  [18-20] 19 (09/08 0524) BP: (117-123)/(48-60) 117/48 (09/08 0524) SpO2:  [96 %-100 %] 96 % (09/08 0524) Last BM Date: 03/13/19 Filed Weights   03/11/19 1706  Weight: 113.4 kg   General: obese, frail/chronically ill looking   Heart: RRR Chest: clear bil but diminished BS right sided Abdomen: soft, NT, ND.  Obese.   Active BS  Extremities: non-pitting pedal edema Neuro/Psych:  Alert.  Oriented x 3.  Appropriate.  Moves all 4 limbs.     Intake/Output from previous day: 09/07 0701 - 09/08 0700 In: 440 [P.O.:240; IV Piggyback:200] Out: 750 [Urine:600; Stool:150]  Intake/Output this shift: No intake/output data recorded.  Lab Results: Recent Labs    03/11/19 2040 03/12/19 0055 03/12/19 0231 03/13/19 0213  WBC 12.4*  --  6.5 11.1*  HGB 8.2* 9.2* 7.3* 7.9*  HCT 25.9* 27.0* 23.2* 23.9*  PLT 248  --  PLATELET CLUMPS NOTED ON SMEAR, UNABLE TO ESTIMATE 258   BMET Recent Labs    03/11/19 2040 03/12/19 0055 03/12/19 0231 03/13/19 0213  NA 130* 133* 133* 133*  K 4.9 4.7 4.9 4.8  CL 102  --  105 106  CO2 18*  --  14* 18*  GLUCOSE 98  --  93 80  BUN 39*  --  36* 32*  CREATININE 2.13*  --  2.05* 1.67*  CALCIUM 8.0*  --  7.8* 7.5*   LFT Recent Labs    03/11/19 2040 03/12/19 0231 03/12/19 1214 03/13/19 0213  PROT 6.4* 6.3*  --  5.9*  ALBUMIN 2.3* 2.2*  --  2.0*  AST 106* 102*  --  98*  ALT 43 39  --  36  ALKPHOS 542* 606*  --  579*  BILITOT 8.0* 8.2* 7.8* 8.2*    BILIDIR  --   --  4.9*  --   IBILI  --   --  2.9*  --    PT/INR Recent Labs    03/11/19 2208  LABPROT 18.3*  INR 1.5*   Hepatitis Panel No results for input(s): HEPBSAG, HCVAB, HEPAIGM, HEPBIGM in the last 72 hours.  Studies/Results: Dg Chest Portable 1 View  Result Date: 03/11/2019 CLINICAL DATA:  Shortness of breath beginning today, has not been eating or drinking since 09-20-2022 when son passed away, nausea, weakness, history hypertension, hyperlipidemia EXAM: PORTABLE CHEST 1 VIEW COMPARISON:  Portable exam 1940 hours compared to 09/12/2014 FINDINGS: Upper normal heart size post median sternotomy. Mediastinal contours and pulmonary vascularity normal. Atherosclerotic calcification aorta. Chronic eventration of the RIGHT diaphragm. Bibasilar atelectasis. No infiltrate, pleural effusion or pneumothorax. Osseous demineralization with advanced degenerative changes of BILATERAL glenohumeral joints. IMPRESSION: Bibasilar atelectasis. Electronically Signed   By: Lavonia Dana M.D.   On: 03/11/2019 19:51   Ct Renal Stone Study  Result Date: 03/11/2019 CLINICAL DATA:  Left flank pain EXAM: CT ABDOMEN AND PELVIS WITHOUT CONTRAST TECHNIQUE: Multidetector CT imaging of the  abdomen and pelvis was performed following the standard protocol without IV contrast. COMPARISON:  None. FINDINGS: Lower chest: No acute abnormality. Hepatobiliary: Nodular contours within the liver compatible with cirrhosis. There are ill-defined low-density areas within the liver, cannot exclude focal masses. Gallbladder is distended and appears to be filled with gallstones. No biliary ductal dilatation. Pancreas: No focal abnormality or ductal dilatation. Spleen: No focal abnormality.  Normal size. Adrenals/Urinary Tract: Bilateral renal cysts. No renal or ureteral stones. No hydronephrosis. Urinary bladder and adrenal glands are unremarkable. Stomach/Bowel: Colonic diverticulosis, most pronounced in the descending colon and sigmoid  colon. Inflammatory stranding around the mid descending colon compatible with active diverticulitis. Stomach and small bowel decompressed, grossly unremarkable. Appendix normal. Vascular/Lymphatic: Aortic atherosclerosis. No enlarged abdominal or pelvic lymph nodes. Reproductive: Uterus and adnexa unremarkable.  No mass. Other: No free fluid or free air. Musculoskeletal: No acute bony abnormality. IMPRESSION: Changes of cirrhosis. There are scattered ill-defined low-density areas within the liver. This could reflect geographic fatty infiltration, but focal hepatic masses/lesions cannot be excluded on this unenhanced study. This could be further evaluated with contrast-enhanced CT or MRI. Gallbladder appears to be distended and filled with gallstones. Colonic diverticulosis. Inflammatory stranding around the mid descending colon compatible with active diverticulitis. Aortic atherosclerosis. No renal or ureteral stones.  No hydronephrosis. Electronically Signed   By: Rolm Baptise M.D.   On: 03/11/2019 23:20   US Abdomen Limited Ruq  Result Date: 03/12/2019 CLINICAL DATA:  Initial evaluation for elevated LFTs, abnormal CT. EXAM: ULTRASOUND ABDOMEN LIMITED RIGHT UPPER QUADRANT COMPARISON:  Prior CT from 03/11/2019. FINDINGS: Gallbladder: Gallbladder distended with multiple internal shadowing echogenic stones, largest of which measures approximately 6 mm. A Wes sign is present. Gallbladder wall measures at the upper limits of normal at 3 mm in thickness. No free pericholecystic fluid. No sonographic Murphy sign elicited on exam. Common bile duct: Diameter: 6 mm Liver: Liver demonstrates a heterogeneous echotexture with nodular contour, consistent with cirrhosis. No focal intrahepatic mass identified by sonography. Portal vein is patent on color Doppler imaging with normal direction of blood flow towards the liver. Other: Incidental note made of 2 small simple cysts within the right kidney, also seen on prior CT.  IMPRESSION: 1. Distended gallbladder filled with stones. No other sonographic features to suggest acute cholecystitis. 2. No biliary dilatation. 3. Morphologic changes consistent with cirrhosis. No discrete intrahepatic mass evident by sonography. Electronically Signed   By: Jeannine Boga M.D.   On: 03/12/2019 00:25    ASSESMENT:   *   Acute descending colitis.  Abdominal pain.    *   Abnormal LFTs, specifically alk phos and T bili.  Cholelithiasis, no evidence acute cholecystitis. 6 mm CBD.    Cirrhosis, new dx per imaging, strongly suspect due to never detected fatty liver but previously normal LFTs.  Platelets Ok.  Slight hyponatremia. Mild coagulopathy with INR 1.5.     GGT 862.    pndg labs: IgG, IgM, AMA, ANA,  Haptoglobin, acute hepatitis panel.    alk phos, T bili stable/contd elevated.    *    Normocytic anemia.   No prior colonoscopy or EGD.    *   AKI.  Baseline CKD 3.  BUN/Creat improved, approaching baseline.      PLAN   *   MRCP, tmrw?, allow additional 24 hours for renal fx to improve.   *   Advance to soft, HH diet.       Shawna Fuentes  03/13/2019, 8:22 AM Phone  [phone] °

## 2019-03-13 NOTE — Evaluation (Signed)
Physical Therapy Evaluation Patient Details Name: Shawna Fuentes MRN: [id number] DOB: 1932/08/06 Today's Date: 03/13/2019   History of Present Illness  85 year old female presents from home with decline in function, diverticulitis, acute kidney disease and cirrhosis. Pt with significiant PMH of OSA, hypertension, dyslipidemia, andhypothyroidism.  Clinical Impression  Pt presents with an overall decrease in functional mobility secondary to above. PTA, pt was mod I for mobility with RW and requires assistance for ADLs and meal prep from personal care attendant and daughter. Pt with recent decline in function over the past week per pt report. Educ on precautions, positioning, therex, and importance of mobility. Today, pt able to perform bed mobility with mod assist, boost up to standing with RW and mod-max assist, short distance gait 5 ft with RW and min assist, limited by fatigue and weakness. Pt would benefit from continued acute PT services to maximize functional mobility and independence prior to d/c to SNF for follow up therapy.      Follow Up Recommendations SNF;Supervision/Assistance - 24 hour    Equipment Recommendations  Other (comment)(tbd next venue)    Recommendations for Other Services OT consult     Precautions / Restrictions Precautions Precautions: Fall Restrictions Weight Bearing Restrictions: No      Mobility  Bed Mobility Overal bed mobility: Needs Assistance Bed Mobility: Supine to Sit     Supine to sit: Mod assist     General bed mobility comments: mod assist for trunk elevation during supine>sit, heavy reliance on use of bedrails  Transfers Overall transfer level: Needs assistance Equipment used: Rolling walker (2 wheeled) Transfers: Sit to/from Stand Sit to Stand: Mod assist;Max assist         General transfer comment: mod assist for sit<>stand from EOB, max assist for boost up to standing from recliner.  Ambulation/Gait Ambulation/Gait assistance:  Min assist Gait Distance (Feet): 5 Feet Assistive device: Rolling walker (2 wheeled) Gait Pattern/deviations: Step-to pattern;Wide base of support;Trunk flexed Gait velocity: decreased Gait velocity interpretation: <1.31 ft/sec, indicative of household ambulator General Gait Details: pt ambulated from bed>recliner this session with min assist and RW, cues for upright posture and device management   Modified Rankin (Stroke Patients Only)       Balance Overall balance assessment: Needs assistance   Sitting balance-Leahy Scale: Good Sitting balance - Comments: dynamic standing balance min guard assist during scooting to EOB   Standing balance support: Bilateral upper extremity supported Standing balance-Leahy Scale: Poor Standing balance comment: relies on UE support using RW for static and dynamic standing balance, pt maintained balance with RW and supervision while therapist assisted with pericare                             Pertinent Vitals/Pain Pain Assessment: No/denies pain    Home Living Family/patient expects to be discharged to:: Private residence Living Arrangements: Children(lives with her daughter) Available Help at Discharge: Personal care attendant;Family;Available PRN/intermittently(caregiver comes in from 8am-11am) Type of Home: House Home Access: Ramped entrance     Home Layout: One level Home Equipment: [place] - 2 wheels;Shower seat;Bedside commode;Wheelchair - manual Additional Comments: uses w/c for community access    Prior Function Level of Independence: Independent with assistive device(s);Needs assistance   Gait / Transfers Assistance Needed: Mod I with RW for all mobility  ADL's / Homemaking Assistance Needed: assistance for ADLs and preparing meals        Hand Dominance  Extremity/Trunk Assessment   Upper Extremity Assessment Upper Extremity Assessment: Generalized weakness    Lower Extremity Assessment Lower  Extremity Assessment: Generalized weakness    Cervical / Trunk Assessment Cervical / Trunk Assessment: Kyphotic  Communication   Communication: No difficulties  Cognition Arousal/Alertness: Awake/alert Behavior During Therapy: WFL for tasks assessed/performed Overall Cognitive Status: Within Functional Limits for tasks assessed                                 General Comments: cognition WFL, A&O x 4      General Comments      Exercises     Assessment/Plan    PT Assessment Patient needs continued PT services  PT Problem List Decreased strength;Decreased balance;Decreased mobility;Obesity;Decreased activity tolerance       PT Treatment Interventions DME instruction;Functional mobility training;Balance training;Patient/family education;Gait training;Therapeutic activities;Therapeutic exercise    PT Goals (Current goals can be found in the Care Plan section)  Acute Rehab PT Goals Patient Stated Goal: "go home" PT Goal Formulation: With patient Time For Goal Achievement: 03/27/19 Potential to Achieve Goals: Fair    Frequency Min 3X/week   Barriers to discharge Decreased caregiver support daugher works during the day, only has personal care attendant for 2 hours per day    Co-evaluation               AM-PAC PT "6 Clicks" Mobility  Outcome Measure Help needed turning from your back to your side while in a flat bed without using bedrails?: A Little Help needed moving from lying on your back to sitting on the side of a flat bed without using bedrails?: A Lot Help needed moving to and from a bed to a chair (including a wheelchair)?: A Little Help needed standing up from a chair using your arms (e.g., wheelchair or bedside chair)?: A Lot Help needed to walk in hospital room?: A Little Help needed climbing 3-5 steps with a railing? : Total 6 Click Score: 14    End of Session Equipment Utilized During Treatment: Gait belt Activity Tolerance: Patient  tolerated treatment well Patient left: in chair;with chair alarm set;with call bell/phone within reach Nurse Communication: Mobility status PT Visit Diagnosis: Unsteadiness on feet (R26.81);Muscle weakness (generalized) (M62.81);Other abnormalities of gait and mobility (R26.89)    Time: [id number] PT Time Calculation (min) (ACUTE ONLY): 25 min   Charges:   PT Evaluation $PT Eval Moderate Complexity: 1 Mod PT Treatments $Therapeutic Activity: 8-22 mins        Netta Corrigan, PT, DPT Acute Rehab Office [place] 03/13/2019, 11:54 AM

## 2019-03-13 NOTE — Progress Notes (Signed)
[facility] PROGRESS NOTE  KHRYSTYNA WILDRICK [id number] DOB: [birth year] DOA: 03/11/2019 PCP: Binnie Rail, MD  Assessment/Plan:  #1. Cirrhosis. New diagnosis and etiology unclear. No hx etoh or hep B. Abnormal LFTs. Echo with EF 60% with mildly increased left ventricular wall thickness. Hep C antibody pending. Serological workup pending as well.  Evaluated by gi who opine suspect fatty liver. Recommend MRI liver/MRCP but want to wait til tomorrow to give renal function another day to improve. Appreciate gi assistance -gentle iv fluids -bmet in am -continue low dose lactulose for now -diet per gi  #2. Anemia. Heme positive stool. Gi recommend ruling out hemolysis and completion of treatment for diverticulitis before endoscopic evaluation. Hg 7.9 this am down from 9.2 yesterday. ?hemolysis -monitor closely  #3. Diverticulitis. Per CT. Exam benign. No fever or leuks. GI recommending completion of treatment. Patient reports no appetite with intermittent nausea.  -diet started per gi -continue antibiotics for now -monitor  #4. Cholelithiasis. Per imaging distended gallbladder with stones without evidence of cholecystitis.   #5. Acute kidney injury superimposed on stage 3 ckd. Creatine trending down this am -gentle iv fluids -hold nephrotoxins -monitor  #6. Obesity/osa -cpap   Code Status: full Family Communication: farrag daughter who is rn here at [facility]. Left message [phone] Disposition Plan: home when ready   Consultants:  [facility]  Procedures:    Antibiotics:  Rocephin 9/7>>  Flagyl 9/7>>  HPI/Subjective: Lying in bed. Denies pain. Reports "bad taste in mouth". Not really hungry  Objective: Vitals:   03/12/19 2206 03/13/19 0524  BP:  (!) 117/48  Pulse: 94 90  Resp: 18 19  Temp:  98.9 F (37.2 C)  SpO2: 96% 96%    Intake/Output Summary (Last 24 hours) at 03/13/2019 1042 Last data filed at 03/13/2019 0850 Gross per 24 hour  Intake 680 ml   Output 750 ml  Net -70 ml   Filed Weights   03/11/19 1706  Weight: 113.4 kg    Exam:   General:  Awake alert no acute distress  Cardiovascular: rrr no mgr trace LE edema  Respiratory: mild increased work of breathing bs distant but clear no crackles  Abdomen: obese soft +BS no guarding or rebounding  Musculoskeletal: joints without swelling/erythema   Data Reviewed: Basic Metabolic Panel: Recent Labs  Lab 03/11/19 2040 03/12/19 0055 03/12/19 0231 03/13/19 0213  NA 130* 133* 133* 133*  K 4.9 4.7 4.9 4.8  CL 102  --  105 106  CO2 18*  --  14* 18*  GLUCOSE 98  --  93 80  BUN 39*  --  36* 32*  CREATININE 2.13*  --  2.05* 1.67*  CALCIUM 8.0*  --  7.8* 7.5*   Liver Function Tests: Recent Labs  Lab 03/11/19 2040 03/12/19 0231 03/12/19 1214 03/13/19 0213  AST 106* 102*  --  98*  ALT 43 39  --  36  ALKPHOS 542* 606*  --  579*  BILITOT 8.0* 8.2* 7.8* 8.2*  PROT 6.4* 6.3*  --  5.9*  ALBUMIN 2.3* 2.2*  --  2.0*   Recent Labs  Lab 03/11/19 2040  LIPASE 77*   Recent Labs  Lab 03/11/19 2208  AMMONIA 65*   CBC: Recent Labs  Lab 03/11/19 2040 03/12/19 0055 03/12/19 0231 03/13/19 0213  WBC 12.4*  --  6.5 11.1*  NEUTROABS 9.9*  --   --   --   HGB 8.2* 9.2* 7.3* 7.9*  HCT 25.9* 27.0* 23.2* 23.9*  MCV 83.3  --  85.3 80.7  PLT 248  --  PLATELET CLUMPS NOTED ON SMEAR, UNABLE TO ESTIMATE 258   Cardiac Enzymes: No results for input(s): CKTOTAL, CKMB, CKMBINDEX, TROPONINI in the last 168 hours. BNP (last 3 results) Recent Labs    03/11/19 2040  BNP 56.7    ProBNP (last 3 results) No results for input(s): PROBNP in the last 8760 hours.  CBG: No results for input(s): GLUCAP in the last 168 hours.  Recent Results (from the past 240 hour(s))  SARS Coronavirus 2 [facility] order, Performed in [facility] hospital lab) Nasopharyngeal Nasopharyngeal Swab     Status: None   Collection Time: 03/11/19  8:49 PM   Specimen: Nasopharyngeal Swab  Result Value  Ref Range Status   SARS Coronavirus 2 NEGATIVE NEGATIVE Final    Comment: (NOTE) If result is NEGATIVE SARS-CoV-2 target nucleic acids are NOT DETECTED. The SARS-CoV-2 RNA is generally detectable in upper and lower  respiratory specimens during the acute phase of infection. The lowest  concentration of SARS-CoV-2 viral copies this assay can detect is 250  copies / mL. A negative result does not preclude SARS-CoV-2 infection  and should not be used as the sole basis for treatment or other  patient management decisions.  A negative result may occur with  improper specimen collection / handling, submission of specimen other  than nasopharyngeal swab, presence of viral mutation(s) within the  areas targeted by this assay, and inadequate number of viral copies  (<250 copies / mL). A negative result must be combined with clinical  observations, patient history, and epidemiological information. If result is POSITIVE SARS-CoV-2 target nucleic acids are DETECTED. The SARS-CoV-2 RNA is generally detectable in upper and lower  respiratory specimens dur ing the acute phase of infection.  Positive  results are indicative of active infection with SARS-CoV-2.  Clinical  correlation with patient history and other diagnostic information is  necessary to determine patient infection status.  Positive results do  not rule out bacterial infection or co-infection with other viruses. If result is PRESUMPTIVE POSTIVE SARS-CoV-2 nucleic acids MAY BE PRESENT.   A presumptive positive result was obtained on the submitted specimen  and confirmed on repeat testing.  While 2019 novel coronavirus  (SARS-CoV-2) nucleic acids may be present in the submitted sample  additional confirmatory testing may be necessary for epidemiological  and / or clinical management purposes  to differentiate between  SARS-CoV-2 and other Sarbecovirus currently known to infect humans.  If clinically indicated additional testing with an  alternate test  methodology [phone]) is advised. The SARS-CoV-2 RNA is generally  detectable in upper and lower respiratory sp ecimens during the acute  phase of infection. The expected result is Negative. Fact Sheet for Patients:  StrictlyIdeas.no Fact Sheet for Healthcare Providers: BankingDealers.co.za This test is not yet approved or cleared by the [place] FDA and has been authorized for detection and/or diagnosis of SARS-CoV-2 by FDA under an Emergency Use Authorization (EUA).  This EUA will remain in effect (meaning this test can be used) for the duration of the COVID-19 declaration under Section 564(b)(1) of the Act, 21 U.S.C. section 360bbb-3(b)(1), unless the authorization is terminated or revoked sooner. Performed at [facility], [place] [address], [place], [place] [address]   Blood culture (routine x 2)     Status: None (Preliminary result)   Collection Time: 03/12/19  2:19 AM   Specimen: BLOOD LEFT HAND  Result Value Ref Range Status   Specimen Description BLOOD LEFT HAND  Final   Special Requests   Final    BOTTLES DRAWN AEROBIC ONLY Blood Culture adequate volume   Culture   Final    NO GROWTH 1 DAY Performed at [facility], [place] [address], [place], [place] [address]    Report Status PENDING  Incomplete  Blood culture (routine x 2)     Status: None (Preliminary result)   Collection Time: 03/12/19  2:31 AM   Specimen: BLOOD RIGHT HAND  Result Value Ref Range Status   Specimen Description BLOOD RIGHT HAND  Final   Special Requests   Final    BOTTLES DRAWN AEROBIC ONLY Blood Culture adequate volume   Culture   Final    NO GROWTH 1 DAY Performed at [facility], [place] [address], [place], [place] [address]    Report Status PENDING  Incomplete     Studies: Dg Chest Portable 1 View  Result Date: 03/11/2019 CLINICAL DATA:  Shortness of breath beginning today, has not been eating or drinking since  10/12/22 when son passed away, nausea, weakness, history hypertension, hyperlipidemia EXAM: PORTABLE CHEST 1 VIEW COMPARISON:  Portable exam 1940 hours compared to 09/12/2014 FINDINGS: Upper normal heart size post median sternotomy. Mediastinal contours and pulmonary vascularity normal. Atherosclerotic calcification aorta. Chronic eventration of the RIGHT diaphragm. Bibasilar atelectasis. No infiltrate, pleural effusion or pneumothorax. Osseous demineralization with advanced degenerative changes of BILATERAL glenohumeral joints. IMPRESSION: Bibasilar atelectasis. Electronically Signed   By: Lavonia Dana M.D.   On: 03/11/2019 19:51   Ct Renal Stone Study  Result Date: 03/11/2019 CLINICAL DATA:  Left flank pain EXAM: CT ABDOMEN AND PELVIS WITHOUT CONTRAST TECHNIQUE: Multidetector CT imaging of the abdomen and pelvis was performed following the standard protocol without IV contrast. COMPARISON:  None. FINDINGS: Lower chest: No acute abnormality. Hepatobiliary: Nodular contours within the liver compatible with cirrhosis. There are ill-defined low-density areas within the liver, cannot exclude focal masses. Gallbladder is distended and appears to be filled with gallstones. No biliary ductal dilatation. Pancreas: No focal abnormality or ductal dilatation. Spleen: No focal abnormality.  Normal size. Adrenals/Urinary Tract: Bilateral renal cysts. No renal or ureteral stones. No hydronephrosis. Urinary bladder and adrenal glands are unremarkable. Stomach/Bowel: Colonic diverticulosis, most pronounced in the descending colon and sigmoid colon. Inflammatory stranding around the mid descending colon compatible with active diverticulitis. Stomach and small bowel decompressed, grossly unremarkable. Appendix normal. Vascular/Lymphatic: Aortic atherosclerosis. No enlarged abdominal or pelvic lymph nodes. Reproductive: Uterus and adnexa unremarkable.  No mass. Other: No free fluid or free air. Musculoskeletal: No acute bony  abnormality. IMPRESSION: Changes of cirrhosis. There are scattered ill-defined low-density areas within the liver. This could reflect geographic fatty infiltration, but focal hepatic masses/lesions cannot be excluded on this unenhanced study. This could be further evaluated with contrast-enhanced CT or MRI. Gallbladder appears to be distended and filled with gallstones. Colonic diverticulosis. Inflammatory stranding around the mid descending colon compatible with active diverticulitis. Aortic atherosclerosis. No renal or ureteral stones.  No hydronephrosis. Electronically Signed   By: Rolm Baptise M.D.   On: 03/11/2019 23:20   US Abdomen Limited Ruq  Result Date: 03/12/2019 CLINICAL DATA:  Initial evaluation for elevated LFTs, abnormal CT. EXAM: ULTRASOUND ABDOMEN LIMITED RIGHT UPPER QUADRANT COMPARISON:  Prior CT from 03/11/2019. FINDINGS: Gallbladder: Gallbladder distended with multiple internal shadowing echogenic stones, largest of which measures approximately 6 mm. A Wes sign is present. Gallbladder wall measures at the upper limits of normal at 3 mm in thickness. No free pericholecystic fluid.  No sonographic Murphy sign elicited on exam. Common bile duct: Diameter: 6 mm Liver: Liver demonstrates a heterogeneous echotexture with nodular contour, consistent with cirrhosis. No focal intrahepatic mass identified by sonography. Portal vein is patent on color Doppler imaging with normal direction of blood flow towards the liver. Other: Incidental note made of 2 small simple cysts within the right kidney, also seen on prior CT. IMPRESSION: 1. Distended gallbladder filled with stones. No other sonographic features to suggest acute cholecystitis. 2. No biliary dilatation. 3. Morphologic changes consistent with cirrhosis. No discrete intrahepatic mass evident by sonography. Electronically Signed   By: Jeannine Boga M.D.   On: 03/12/2019 00:25    Scheduled Meds: . amLODipine  5 mg Oral Daily  . lactulose   10 g Oral BID  . levothyroxine  100 mcg Oral QAC breakfast  . pantoprazole  40 mg Oral Q1200   Continuous Infusions: . cefTRIAXone (ROCEPHIN)  IV 2 g (03/13/19 0315)  . metronidazole 500 mg (03/13/19 0802)    Principal Problem:   Acute diverticulitis Active Problems:   Hyperammonemia (HCC)   Cirrhosis of liver (HCC)   Cholelithiasis   Anemia   Acute renal failure superimposed on stage 3 chronic kidney disease (HCC)   Hypertension   Edema, bilateral legs   Chronic respiratory failure with hypercapnia (HCC)   Obstructive sleep apnea   Elevated LFTs   Hyperbilirubinemia    Time spent: 45 minutes    [place] NP  [facility]  If 7PM-7AM, please contact night-coverage at [email or web address], password [facility] 03/13/2019, 10:42 AM  LOS: 1 day

## 2019-03-14 ENCOUNTER — Inpatient Hospital Stay (HOSPITAL_COMMUNITY): Payer: Medicare HMO

## 2019-03-14 LAB — CBC
HCT: 25.2 % — ABNORMAL LOW (ref 36.0–46.0)
Hemoglobin: 8.2 g/dL — ABNORMAL LOW (ref 12.0–15.0)
MCH: 26.3 pg (ref 26.0–34.0)
MCHC: 32.5 g/dL (ref 30.0–36.0)
MCV: 80.8 fL (ref 80.0–100.0)
Platelets: 272 10*3/uL (ref 150–400)
RBC: 3.12 MIL/uL — ABNORMAL LOW (ref 3.87–5.11)
RDW: 17.4 % — ABNORMAL HIGH (ref 11.5–15.5)
WBC: 11.9 10*3/uL — ABNORMAL HIGH (ref 4.0–10.5)
nRBC: 0 % (ref 0.0–0.2)

## 2019-03-14 LAB — COMPREHENSIVE METABOLIC PANEL
ALT: 32 U/L (ref 0–44)
AST: 91 U/L — ABNORMAL HIGH (ref 15–41)
Albumin: 1.8 g/dL — ABNORMAL LOW (ref 3.5–5.0)
Alkaline Phosphatase: 528 U/L — ABNORMAL HIGH (ref 38–126)
Anion gap: 10 (ref 5–15)
BUN: 34 mg/dL — ABNORMAL HIGH (ref 8–23)
CO2: 17 mmol/L — ABNORMAL LOW (ref 22–32)
Calcium: 7.4 mg/dL — ABNORMAL LOW (ref 8.9–10.3)
Chloride: 107 mmol/L (ref 98–111)
Creatinine, Ser: 2.16 mg/dL — ABNORMAL HIGH (ref 0.44–1.00)
GFR calc Af Amer: 24 mL/min — ABNORMAL LOW (ref >60)
GFR calc non Af Amer: 20 mL/min — ABNORMAL LOW (ref >60)
Glucose, Bld: 90 mg/dL (ref 70–99)
Potassium: 5.1 mmol/L (ref 3.5–5.1)
Sodium: 134 mmol/L — ABNORMAL LOW (ref 135–145)
Total Bilirubin: 8.7 mg/dL — ABNORMAL HIGH (ref 0.3–1.2)
Total Protein: 5.6 g/dL — ABNORMAL LOW (ref 6.5–8.1)

## 2019-03-14 LAB — ANA W/REFLEX IF POSITIVE: Anti Nuclear Antibody (ANA): NEGATIVE

## 2019-03-14 LAB — HEPATITIS PANEL, ACUTE
HCV Ab: <0.1 s/co ratio (ref 0.0–0.9)
Hep A IgM: NEGATIVE
Hep B C IgM: NEGATIVE
Hepatitis B Surface Ag: NEGATIVE

## 2019-03-14 LAB — IGM: IgM (Immunoglobulin M), Srm: 126 mg/dL (ref 26–217)

## 2019-03-14 LAB — IGG: IgG (Immunoglobin G), Serum: 840 mg/dL (ref 586–1602)

## 2019-03-14 LAB — ANCA TITERS
Atypical P-ANCA titer: <1:20 {titer}
C-ANCA: <1:20 {titer}
P-ANCA: <1:20 {titer}

## 2019-03-14 LAB — MITOCHONDRIAL ANTIBODIES: Mitochondrial M2 Ab, IgG: 29.1 Units — ABNORMAL HIGH (ref 0.0–20.0)

## 2019-03-14 MED ORDER — SODIUM CHLORIDE 0.9 % IV SOLN: 2.0000 g | INTRAVENOUS | Status: DC

## 2019-03-14 MED ORDER — PHYTONADIONE 5 MG PO TABS
5.0000 mg | ORAL_TABLET | Freq: Once | ORAL | Status: AC
  Administered 2019-03-14: 14:00:00 5 mg via ORAL
  Filled 2019-03-14: qty 1

## 2019-03-14 MED ORDER — SODIUM CHLORIDE 0.9 % IV SOLN
INTRAVENOUS | Status: AC
  Administered 2019-03-14 (×2): via INTRAVENOUS

## 2019-03-14 MED ORDER — CIPROFLOXACIN IN D5W 400 MG/200ML IV SOLN
400.0000 mg | INTRAVENOUS | Status: DC
  Filled 2019-03-14: qty 200

## 2019-03-14 NOTE — Progress Notes (Signed)
Pt placed on CPAP for night rest tolerating well  

## 2019-03-14 NOTE — Progress Notes (Addendum)
Daily Rounding Note  03/14/2019, 8:25 AM  LOS: 2 days   SUBJECTIVE:   Chief complaint:  Diverticulitis.  Elevated LFTs, new dx cirrhosis.      C/o  Dysphagia.  Not able to tolerate regular textured diet yesterday.  Says this is been going on for a couple of weeks.  Lactulose is causing abdominal discomfort.   Soft BM early this morning.  No nausea. 400 mL urine output recorded yesterday via pure wick collection system.  There is maybe 50 mL's and there right now of light amber-colored urine   OBJECTIVE:         Vital signs in last 24 hours:    Temp:  [97.6 F (36.4 C)-98.4 F (36.9 C)] 97.6 F (36.4 C) (09/09 0438) Pulse Rate:  [87-116] 87 (09/09 0438) Resp:  [18-20] 18 (09/09 0438) BP: (102-106)/(40-51) 103/40 (09/09 0438) SpO2:  [95 %] 95 % (09/09 0438) Last BM Date: 03/13/19 Filed Weights   03/11/19 1706  Weight: 113.4 kg   General: Obese, looks unwell but not acutely ill. Heart: RRR. Chest: Mild cough and frequent clearing of secretions from her throat. Abdomen: Obese.  Soft.  Not tender.  Active bowel sounds. Extremities: Nonpitting edema in the lower extremities. Neuro/Psych: Appropriate.  Not confused.  Moves all 4 limbs.  No tremors.  Intake/Output from previous day: 09/08 0701 - 09/09 0700 In: 530 [P.O.:530] Out: 400 [Urine:400]  Intake/Output this shift: No intake/output data recorded.  Lab Results: Recent Labs    03/12/19 0231 03/13/19 0213 03/14/19 0239  WBC 6.5 11.1* 11.9*  HGB 7.3* 7.9* 8.2*  HCT 23.2* 23.9* 25.2*  PLT PLATELET CLUMPS NOTED ON SMEAR, UNABLE TO ESTIMATE 258 272   BMET Recent Labs    03/12/19 0231 03/13/19 0213 03/14/19 0239  NA 133* 133* 134*  K 4.9 4.8 5.1  CL 105 106 107  CO2 14* 18* 17*  GLUCOSE 93 80 90  BUN 36* 32* 34*  CREATININE 2.05* 1.67* 2.16*  CALCIUM 7.8* 7.5* 7.4*   LFT Recent Labs    03/12/19 0231 03/12/19 1214 03/13/19 0213 03/14/19 0239   PROT 6.3*  --  5.9* 5.6*  ALBUMIN 2.2*  --  2.0* 1.8*  AST 102*  --  98* 91*  ALT 39  --  36 32  ALKPHOS 606*  --  579* 528*  BILITOT 8.2* 7.8* 8.2* 8.7*  BILIDIR  --  4.9*  --   --   IBILI  --  2.9*  --   --    PT/INR Recent Labs    03/11/19 2208  LABPROT 18.3*  INR 1.5*   Hepatitis Panel Recent Labs    03/12/19 0231 03/12/19 1537  HEPBSAG Negative  --   HCVAB <0.1 <0.1  HEPAIGM Negative  --   HEPBIGM Negative  --     Studies/Results: No results found.   Scheduled Meds: . amLODipine  5 mg Oral Daily  . lactulose  10 g Oral BID  . levothyroxine  100 mcg Oral QAC breakfast  . pantoprazole  40 mg Oral Q1200   Continuous Infusions: . cefTRIAXone (ROCEPHIN)  IV 2 g (03/13/19 2340)  . metronidazole 500 mg (03/14/19 0827)   PRN Meds:.acetaminophen **OR** acetaminophen, ondansetron **OR** ondansetron (ZOFRAN) IV   ASSESMENT:   *   Acute descending colitis.  Descending diverticulitis.  Abdominal pain. On Rocephin day 4, flagyl day 3.     *   Abnormal LFTs, specifically alk phos  and T bili.  Cholelithiasis, no evidence acute cholecystitis. 6 mm CBD.    Cirrhosis and indeterminate liver lesions, new dx per imaging, suspect due fatty liver but previously normal LFTs.  Platelets Ok.  Slight hyponatremia. Mild coagulopathy with INR 1.5.     GGT 862.    pndg labs: IgG, IgM, AMA, ANA, ANCA,  Haptoglobin, acute hepatitis panel.    With exception T bili, LFTs trending down.    *     Dysphagia.  This is been going on for couple of weeks but today is the first I am hearing of it.  *    Normocytic anemia.   No prior colonoscopy or EGD.  Feraheme on 9/7.    *   AKI.  Baseline CKD 3.  BUN/Creat trending up last 24 hours.     PLAN   *   Note Rocephin dose of  2 gm q 24 hours.  Drug review mentions caution in setting elevated bili and renal impairment.   Going to drop to 1 gm/day.  Already got 2 gm dose today.      *  Hold off on MRI/MRCP given worsening renal fx.     *      Switch to chopped, dysphagia 2 diet.  Before resuming diet, obtain barium esophagram to assess complaint of dysphagia.  *     C-Met in a.m.  *   Consider bolus of IVF but defer to attending MD.     Addendum 11:25 AM Dr. Havery Moros has requested that the Rocephin be discontinued given it can cause cholestasis.  Plan to substitute with renally dosed IV Cipro, 400/24 hours starting tmrw.   Azucena Freed  03/14/2019, 8:25 AM Phone [phone]

## 2019-03-14 NOTE — Progress Notes (Signed)
PROGRESS NOTE  Shawna Fuentes  UVO:[id number] DOB: September 26, 1932 DOA: 03/11/2019 PCP: Binnie Rail, MD  Brief Narrative:  Shawna Fuentes is a 85 y.o. female with medical history significant of DVT, HTN, OSA chronic resp failure with hypoxia, CKD stage 3. Patient presents to the ED with c/o 1 week history of SOB, generalized weakness, loss of appetite.  Son passed away on 09-05-2022.  Symptoms ongoing since that time. She has had increased leg swelling recently for which PCP increased dose of Lasix.  Hasnt helped symptoms. She also reports occasional mid left lower abdominal pain with constipation. No vomiting, had some nausea after trying to eat, drinking ensure. In ED despite the initial suspicion that symptoms would be due to grief.  She actually has multiple medical abnormalities and findings. Acute diverticulitis demonstrated on CT scan, wbc 12.4K. Anemia with HGB 8.2 down from 13.3 baseline in Dec last year.  Trace positive hemoccult though no frank large GIB. Apparent new diagnosis of cirrhosis appearance of liver on imaging, some question of mass vs nodular appearance on CT scan though [place] didn't show mass.  Ammonia of 65, tbili of 8, AST 106, ALT 43, lipase 77, ALK 542. Creat 2.1 up from 1.2 baseline. Sodium 130. Cholelithiasis without acute cholecystitis. Patient admitted for further GI workup given lab abnormalities and findings on imaging to rule out overt cause of anemia.  Assessment & Plan:   Principal Problem:   Acute diverticulitis Active Problems:   Hypertension   Edema, bilateral legs   Chronic respiratory failure with hypercapnia (HCC)   Obstructive sleep apnea   Hyperammonemia (HCC)   Cirrhosis of liver (HCC)   Cholelithiasis   Anemia   Acute renal failure superimposed on stage 3 chronic kidney disease (HCC)   Elevated LFTs   Hyperbilirubinemia   Abnormal LFTs ?Cirrhosis, Unclear Etiology, POA - New diagnosis, has evidence of edema, hypoalbuminemia, coagulopathy, with anemia -  Denies alcoholism or hepatitis B or C, suspect NASH - We will ask gastroenterology for input - MRCP pending - Mitochondrial Ab elevated concerning for primary biliary cirrhosis. - Hepatitis panel unremarkable - TTE - impaired relaxation EF 60-65% - Has distended gallbladder with stones without CT or ultrasound evidence of cholecystitis, exam is unremarkable, RUQ is benign - Trend LFTs  AKI without history of CKD - Continue low IVF - MRCP on hold given GFR below 30 - Baseline creatinine is 1.2 from last year now with creatinine of 2.1, suspect this is secondary to poor p.o. intake in the setting of recent diuretics and ARB use - Lasix, ARB on hold - ? hepatorenal syndrome  Anemia, likely chronic anemia of chronic disease given above - Secondary to chronic disease and iron deficiency, this is relevant in the setting of new onset cirrhosis - Hemoccult is positive no overt bleeding, considerable drop from normal hemoglobin last year to 7.3 now, trend - May need transfusion soon, will give IV iron x1 now, add PPI  ?  Diverticulitis - Unlikely - clinically benign - tolerating PO well - no issues - Hold abx and follow clinically  Obesity/OSA - Continue CPAP nightly  DVT prophylaxis: currently holding given hemoccult positive in ED Code Status: Full Disposition Plan: pending clinical improvement/further evaluation  Consultants:   GI  Procedures:  Pending workup  Antimicrobials:  Cipro/flagyl stopped  Subjective: No acute issues or events overnight, denies chest pain, shortness of breath, nausea, vomiting, diarrhea, constipation, headache, fevers, chills.  Objective: Vitals:   03/13/19 1502 03/13/19 2113 03/14/19 7425  03/14/19 0827  BP: (!) 106/51 (!) 102/48 (!) 103/40 (!) 115/51  Pulse: 90 (!) 105 87   Resp: 20 18 18    Temp: 98.4 F (36.9 C) 98 F (36.7 C) 97.6 F (36.4 C)   TempSrc: Oral Oral Oral   SpO2: 95% 95% 95%   Weight:      Height:        Intake/Output  Summary (Last 24 hours) at 03/14/2019 0842 Last data filed at 03/14/2019 0600 Gross per 24 hour  Intake 530 ml  Output 400 ml  Net 130 ml   Filed Weights   03/11/19 1706  Weight: 113.4 kg    Examination:  General:  Pleasantly resting in bed, No acute distress. HEENT:  Normocephalic atraumatic.  Sclerae nonicteric, noninjected.  Extraocular movements intact bilaterally. Neck:  Without mass or deformity.  Trachea is midline. Lungs:  Clear to auscultate bilaterally without rhonchi, wheeze, or rales. Heart:  Regular rate and rhythm.  Without murmurs, rubs, or gallops. Abdomen:  Soft, nontender, nondistended.  Without guarding or rebound. Extremities: Without cyanosis, clubbing, edema, or obvious deformity. Vascular:  Dorsalis pedis and posterior tibial pulses palpable bilaterally. Skin:  Warm and dry, no erythema, no ulcerations.   Data Reviewed: I have personally reviewed following labs and imaging studies  CBC: Recent Labs  Lab 03/11/19 2040 03/12/19 0055 03/12/19 0231 03/13/19 0213 03/14/19 0239  WBC 12.4*  --  6.5 11.1* 11.9*  NEUTROABS 9.9*  --   --   --   --   HGB 8.2* 9.2* 7.3* 7.9* 8.2*  HCT 25.9* 27.0* 23.2* 23.9* 25.2*  MCV 83.3  --  85.3 80.7 80.8  PLT 248  --  PLATELET CLUMPS NOTED ON SMEAR, UNABLE TO ESTIMATE 258 355   Basic Metabolic Panel: Recent Labs  Lab 03/11/19 2040 03/12/19 0055 03/12/19 0231 03/13/19 0213 03/14/19 0239  NA 130* 133* 133* 133* 134*  K 4.9 4.7 4.9 4.8 5.1  CL 102  --  105 106 107  CO2 18*  --  14* 18* 17*  GLUCOSE 98  --  93 80 90  BUN 39*  --  36* 32* 34*  CREATININE 2.13*  --  2.05* 1.67* 2.16*  CALCIUM 8.0*  --  7.8* 7.5* 7.4*   GFR: Estimated Creatinine Clearance: 23.5 mL/min (A) (by C-G formula based on SCr of 2.16 mg/dL (H)). Liver Function Tests: Recent Labs  Lab 03/11/19 2040 03/12/19 0231 03/12/19 1214 03/13/19 0213 03/14/19 0239  AST 106* 102*  --  98* 91*  ALT 43 39  --  36 32  ALKPHOS 542* 606*  --  579*  528*  BILITOT 8.0* 8.2* 7.8* 8.2* 8.7*  PROT 6.4* 6.3*  --  5.9* 5.6*  ALBUMIN 2.3* 2.2*  --  2.0* 1.8*   Recent Labs  Lab 03/11/19 2040  LIPASE 77*   Recent Labs  Lab 03/11/19 2208  AMMONIA 65*   Coagulation Profile: Recent Labs  Lab 03/11/19 2208  INR 1.5*   Thyroid Function Tests: Recent Labs    03/12/19 0231  TSH 1.400   Anemia Panel: Recent Labs    03/11/19 2208 03/12/19 1214  VITAMINB12 1,442*  --   FOLATE 7.0  --   FERRITIN 635*  --   TIBC 122*  --   IRON 12*  --   RETICCTPCT 1.4 1.6   Sepsis Labs: Recent Labs  Lab 03/12/19 0220 03/12/19 0546  LATICACIDVEN 1.5 0.9    Recent Results (from the past 240 hour(s))  SARS  Coronavirus 2 [facility] order, Performed in [facility] hospital lab) Nasopharyngeal Nasopharyngeal Swab     Status: None   Collection Time: 03/11/19  8:49 PM   Specimen: Nasopharyngeal Swab  Result Value Ref Range Status   SARS Coronavirus 2 NEGATIVE NEGATIVE Final    Comment: (NOTE) If result is NEGATIVE SARS-CoV-2 target nucleic acids are NOT DETECTED. The SARS-CoV-2 RNA is generally detectable in upper and lower  respiratory specimens during the acute phase of infection. The lowest  concentration of SARS-CoV-2 viral copies this assay can detect is 250  copies / mL. A negative result does not preclude SARS-CoV-2 infection  and should not be used as the sole basis for treatment or other  patient management decisions.  A negative result may occur with  improper specimen collection / handling, submission of specimen other  than nasopharyngeal swab, presence of viral mutation(s) within the  areas targeted by this assay, and inadequate number of viral copies  (<250 copies / mL). A negative result must be combined with clinical  observations, patient history, and epidemiological information. If result is POSITIVE SARS-CoV-2 target nucleic acids are DETECTED. The SARS-CoV-2 RNA is generally detectable in upper and lower  respiratory  specimens dur ing the acute phase of infection.  Positive  results are indicative of active infection with SARS-CoV-2.  Clinical  correlation with patient history and other diagnostic information is  necessary to determine patient infection status.  Positive results do  not rule out bacterial infection or co-infection with other viruses. If result is PRESUMPTIVE POSTIVE SARS-CoV-2 nucleic acids MAY BE PRESENT.   A presumptive positive result was obtained on the submitted specimen  and confirmed on repeat testing.  While 2019 novel coronavirus  (SARS-CoV-2) nucleic acids may be present in the submitted sample  additional confirmatory testing may be necessary for epidemiological  and / or clinical management purposes  to differentiate between  SARS-CoV-2 and other Sarbecovirus currently known to infect humans.  If clinically indicated additional testing with an alternate test  methodology [phone]) is advised. The SARS-CoV-2 RNA is generally  detectable in upper and lower respiratory sp ecimens during the acute  phase of infection. The expected result is Negative. Fact Sheet for Patients:  StrictlyIdeas.no Fact Sheet for Healthcare Providers: BankingDealers.co.za This test is not yet approved or cleared by the [place] FDA and has been authorized for detection and/or diagnosis of SARS-CoV-2 by FDA under an Emergency Use Authorization (EUA).  This EUA will remain in effect (meaning this test can be used) for the duration of the COVID-19 declaration under Section 564(b)(1) of the Act, 21 U.S.C. section 360bbb-3(b)(1), unless the authorization is terminated or revoked sooner. Performed at [facility], [place] [address] [place], [place] [address]   Blood culture (routine x 2)     Status: None (Preliminary result)   Collection Time: 03/12/19  2:19 AM   Specimen: BLOOD LEFT HAND  Result Value Ref Range Status   Specimen Description  BLOOD LEFT HAND  Final   Special Requests   Final    BOTTLES DRAWN AEROBIC ONLY Blood Culture adequate volume   Culture   Final    NO GROWTH 2 DAYS Performed at [facility], [place] [address] [place], [place] [address]    Report Status PENDING  Incomplete  Blood culture (routine x 2)     Status: None (Preliminary result)   Collection Time: 03/12/19  2:31 AM   Specimen: BLOOD RIGHT HAND  Result Value Ref Range  Status   Specimen Description BLOOD RIGHT HAND  Final   Special Requests   Final    BOTTLES DRAWN AEROBIC ONLY Blood Culture adequate volume   Culture   Final    NO GROWTH 2 DAYS Performed at [facility], [address] [place], [place] [address]    Report Status PENDING  Incomplete    Radiology Studies: No results found.   Scheduled Meds: . amLODipine  5 mg Oral Daily  . lactulose  10 g Oral BID  . levothyroxine  100 mcg Oral QAC breakfast  . pantoprazole  40 mg Oral Q1200   Continuous Infusions: . [START ON 03/15/2019] cefTRIAXone (ROCEPHIN)  IV    . metronidazole 500 mg (03/14/19 0827)     LOS: 2 days   Time spent: 59 min  Little Ishikawa, DO [facility]  If 7PM-7AM, please contact night-coverage [email or web address] Password [facility] 03/14/2019, 8:42 AM

## 2019-03-14 NOTE — Plan of Care (Signed)
  Problem: Activity: Goal: Risk for activity intolerance will decrease Outcome: Progressing   Problem: Nutrition: Goal: Adequate nutrition will be maintained Outcome: Progressing   Problem: Pain Managment: Goal: General experience of comfort will improve Outcome: Progressing   

## 2019-03-15 ENCOUNTER — Inpatient Hospital Stay (HOSPITAL_COMMUNITY): Payer: Medicare HMO

## 2019-03-15 DIAGNOSIS — R1310 Dysphagia, unspecified: Secondary | ICD-10-CM

## 2019-03-15 LAB — CBC
HCT: 24.4 % — ABNORMAL LOW (ref 36.0–46.0)
Hemoglobin: 8 g/dL — ABNORMAL LOW (ref 12.0–15.0)
MCH: 26.3 pg (ref 26.0–34.0)
MCHC: 32.8 g/dL (ref 30.0–36.0)
MCV: 80.3 fL (ref 80.0–100.0)
Platelets: 276 10*3/uL (ref 150–400)
RBC: 3.04 MIL/uL — ABNORMAL LOW (ref 3.87–5.11)
RDW: 17.5 % — ABNORMAL HIGH (ref 11.5–15.5)
WBC: 13.5 10*3/uL — ABNORMAL HIGH (ref 4.0–10.5)
nRBC: 0.1 % (ref 0.0–0.2)

## 2019-03-15 LAB — COMPREHENSIVE METABOLIC PANEL
ALT: 31 U/L (ref 0–44)
AST: 108 U/L — ABNORMAL HIGH (ref 15–41)
Albumin: 1.8 g/dL — ABNORMAL LOW (ref 3.5–5.0)
Alkaline Phosphatase: 543 U/L — ABNORMAL HIGH (ref 38–126)
Anion gap: 10 (ref 5–15)
BUN: 35 mg/dL — ABNORMAL HIGH (ref 8–23)
CO2: 17 mmol/L — ABNORMAL LOW (ref 22–32)
Calcium: 7.1 mg/dL — ABNORMAL LOW (ref 8.9–10.3)
Chloride: 107 mmol/L (ref 98–111)
Creatinine, Ser: 1.92 mg/dL — ABNORMAL HIGH (ref 0.44–1.00)
GFR calc Af Amer: 27 mL/min — ABNORMAL LOW (ref >60)
GFR calc non Af Amer: 23 mL/min — ABNORMAL LOW (ref >60)
Glucose, Bld: 79 mg/dL (ref 70–99)
Potassium: 5.3 mmol/L — ABNORMAL HIGH (ref 3.5–5.1)
Sodium: 134 mmol/L — ABNORMAL LOW (ref 135–145)
Total Bilirubin: 9.6 mg/dL — ABNORMAL HIGH (ref 0.3–1.2)
Total Protein: 5.8 g/dL — ABNORMAL LOW (ref 6.5–8.1)

## 2019-03-15 LAB — AFP TUMOR MARKER: AFP, Serum, Tumor Marker: 3.1 ng/mL (ref 0.0–8.3)

## 2019-03-15 LAB — ANTI-SMOOTH MUSCLE ANTIBODY, IGG: F-Actin IgG: 12 Units (ref 0–19)

## 2019-03-15 LAB — MONONUCLEOSIS SCREEN: Mono Screen: NEGATIVE

## 2019-03-15 NOTE — TOC Progression Note (Addendum)
Transition of Care [facility]) - Progression Note    Patient Details  Name: Shawna Fuentes MRN: [id number] Date of Birth: 23-Jun-1933  Transition of Care [facility]) CM/SW [place], [place] Phone Number: 03/15/2019, 1:17 PM  Clinical Narrative:    3:41pm- CSW spoke with pt son Izell [place] who was in room with pt; we discussed recommendations and pt limitations. At current time pt family able to provide 24/7 assistance between sons and daughter. She also has a home aide that comes to her home. Pt may need a new wheelchair but states she has all other needed equipment. Pt son requesting [facility] as pt has had them before. Requested [facility] HHPT/OT and referral placed to [facility] with [facility].   1:17pm- CSW left a HIPAA compliant message with pt son Izell [place] who was going to consult with pt family regarding SNF placement. Continue to follow.    Barriers to Discharge: Continued Medical Work up, Museum/gallery curator In-house Referral: Clinical Social Work Discharge Planning Services: NA Living arrangements for the past 2 months: [place]           DME Arranged: N/A DME Agency: NA HH Arranged: NA HH Agency: NA   Social Determinants of Health ([place]) Interventions    Readmission Risk Interventions Readmission Risk Prevention Plan 03/13/2019  Transportation Screening Complete  PCP or Specialist Appt within 5-7 Days Complete  Home Care Screening Complete  Medication Review (RN CM) Complete  Some recent data might be hidden

## 2019-03-15 NOTE — Progress Notes (Signed)
PROGRESS NOTE  Shawna Fuentes  VHQ:[id number] DOB: [birth year] DOA: 03/11/2019 PCP: Shawna Rail, MD  Brief Narrative:  Shawna Fuentes is a 85 y.o. female with medical history significant of DVT, HTN, OSA chronic resp failure with hypoxia, CKD stage 3. Patient presents to the ED with c/o 1 week history of SOB, generalized weakness, loss of appetite.  Son passed away on 08/25/2022.  Symptoms ongoing since that time. She has had increased leg swelling recently for which PCP increased dose of Lasix.  Hasnt helped symptoms. She also reports occasional mid left lower abdominal pain with constipation. No vomiting, had some nausea after trying to eat, drinking ensure. In ED despite the initial suspicion that symptoms would be due to grief.  She actually has multiple medical abnormalities and findings. Acute diverticulitis demonstrated on CT scan, wbc 12.4K. Anemia with HGB 8.2 down from 13.3 baseline in Dec last year.  Trace positive hemoccult though no frank large GIB. Apparent new diagnosis of cirrhosis appearance of liver on imaging, some question of mass vs nodular appearance on CT scan though [place] didn't show mass.  Ammonia of 65, tbili of 8, AST 106, ALT 43, lipase 77, ALK 542. Creat 2.1 up from 1.2 baseline. Sodium 130. Cholelithiasis without acute cholecystitis. Patient admitted for further GI workup given lab abnormalities and findings on imaging to rule out overt cause of anemia.  Assessment & Plan:   Principal Problem:   Acute diverticulitis Active Problems:   Hypertension   Edema, bilateral legs   Chronic respiratory failure with hypercapnia (HCC)   Obstructive sleep apnea   Hyperammonemia (HCC)   Cirrhosis of liver (HCC)   Cholelithiasis   Anemia   Acute renal failure superimposed on stage 3 chronic kidney disease (HCC)   Elevated LFTs   Hyperbilirubinemia   Abnormal LFTs ?Cirrhosis, Unclear Etiology, POA - New diagnosis, has evidence of edema, hypoalbuminemia, coagulopathy, with anemia -  Denies alcoholism or hepatitis B or C, suspect NASH - We will ask gastroenterology for input - MRCP without contrast pending given ongoing low GFR - Mitochondrial Ab elevated concerning for primary biliary cirrhosis;  - IgM, IgG, ANCA, ANA, Mono screen all negative/WNL - Hepatitis panel unremarkable - TTE - impaired relaxation EF 60-65% - Has distended gallbladder with stones without CT or ultrasound evidence of cholecystitis, exam is unremarkable, RUQ is benign - Trend LFTs -AST stable around 100, ALT within normal limits, alk phos minimally downtrending currently 540, bilirubin increasing currently 9.6  AKI without known history of CKD, improving - Continue low IVF - MRCP to be performed without contrast given GFR below 30 - Baseline creatinine is 1.2 from last year now with creatinine of 9, suspect this is secondary to poor p.o. intake in the setting of recent diuretics and ARB use - Lasix, ARB on hold - ? hepatorenal syndrome less likely  Anemia, likely chronic anemia of chronic disease given above, stable - Secondary to chronic disease and iron deficiency, this is relevant in the setting of new onset cirrhosis - Hemoccult is positive no overt bleeding -Stable at 8.0 -Hold off on transfusion unless symptomatic -as low as 7.3, now uptrending after IV iron  ?  Diverticulitis - Unlikely - clinically benign - tolerating PO well - no issues - Hold abx and follow clinically  Obesity/OSA - Continue CPAP nightly  DVT prophylaxis: currently holding given hemoccult positive in ED Code Status: Full Disposition Plan: Continue as inpatient, pending clinical improvement/further evaluation  Consultants:   GI  Antimicrobials:  Cipro/flagyl stopped 03/14/2019  Subjective: No acute issues or events overnight, denies chest pain, shortness of breath, nausea, vomiting, diarrhea, constipation, headache, fevers, chills.  Objective: Vitals:   03/14/19 1449 03/14/19 1958 03/14/19 2254 03/15/19  0524  BP: (!) 106/47 (!) 106/48  122/60  Pulse: 95 90 98 96  Resp: 18 17    Temp: 97.8 F (36.6 C) 97.6 F (36.4 C)  98.8 F (37.1 C)  TempSrc: Oral Oral  Oral  SpO2: 96% 97% 98% 98%  Weight:      Height:        Intake/Output Summary (Last 24 hours) at 03/15/2019 0837 Last data filed at 03/15/2019 0530 Gross per 24 hour  Intake 240 ml  Output 300 ml  Net -60 ml   Filed Weights   03/11/19 1706  Weight: 113.4 kg    Examination:  General:  Pleasantly resting in bed, No acute distress. HEENT:  Normocephalic atraumatic.  Sclerae nonicteric, noninjected.  Extraocular movements intact bilaterally. Neck:  Without mass or deformity.  Trachea is midline. Lungs:  Clear to auscultate bilaterally without rhonchi, wheeze, or rales. Heart:  Regular rate and rhythm.  Without murmurs, rubs, or gallops. Abdomen:  Soft, nontender, nondistended.  Without guarding or rebound. Extremities: Without cyanosis, clubbing, edema, or obvious deformity. Vascular:  Dorsalis pedis and posterior tibial pulses palpable bilaterally. Skin:  Warm and dry, no erythema, no ulcerations.   Data Reviewed: I have personally reviewed following labs and imaging studies  CBC: Recent Labs  Lab 03/11/19 2040 03/12/19 0055 03/12/19 0231 03/13/19 0213 03/14/19 0239 03/15/19 0113  WBC 12.4*  --  6.5 11.1* 11.9* 13.5*  NEUTROABS 9.9*  --   --   --   --   --   HGB 8.2* 9.2* 7.3* 7.9* 8.2* 8.0*  HCT 25.9* 27.0* 23.2* 23.9* 25.2* 24.4*  MCV 83.3  --  85.3 80.7 80.8 80.3  PLT 248  --  PLATELET CLUMPS NOTED ON SMEAR, UNABLE TO ESTIMATE 258 272 295   Basic Metabolic Panel: Recent Labs  Lab 03/11/19 2040 03/12/19 0055 03/12/19 0231 03/13/19 0213 03/14/19 0239 03/15/19 0113  NA 130* 133* 133* 133* 134* 134*  K 4.9 4.7 4.9 4.8 5.1 5.3*  CL 102  --  105 106 107 107  CO2 18*  --  14* 18* 17* 17*  GLUCOSE 98  --  93 80 90 79  BUN 39*  --  36* 32* 34* 35*  CREATININE 2.13*  --  2.05* 1.67* 2.16* 1.92*  CALCIUM  8.0*  --  7.8* 7.5* 7.4* 7.1*   GFR: Estimated Creatinine Clearance: 26.4 mL/min (A) (by C-G formula based on SCr of 1.92 mg/dL (H)). Liver Function Tests: Recent Labs  Lab 03/11/19 2040 03/12/19 0231 03/12/19 1214 03/13/19 0213 03/14/19 0239 03/15/19 0113  AST 106* 102*  --  98* 91* 108*  ALT 43 39  --  36 32 31  ALKPHOS 542* 606*  --  579* 528* 543*  BILITOT 8.0* 8.2* 7.8* 8.2* 8.7* 9.6*  PROT 6.4* 6.3*  --  5.9* 5.6* 5.8*  ALBUMIN 2.3* 2.2*  --  2.0* 1.8* 1.8*   Recent Labs  Lab 03/11/19 2040  LIPASE 77*   Recent Labs  Lab 03/11/19 2208  AMMONIA 65*   Coagulation Profile: Recent Labs  Lab 03/11/19 2208  INR 1.5*   Anemia Panel: Recent Labs    03/12/19 1214  RETICCTPCT 1.6   Sepsis Labs: Recent Labs  Lab 03/12/19 0220 03/12/19 0546  LATICACIDVEN 1.5 0.9  Recent Results (from the past 240 hour(s))  SARS Coronavirus 2 [facility] order, Performed in [facility] hospital lab) Nasopharyngeal Nasopharyngeal Swab     Status: None   Collection Time: 03/11/19  8:49 PM   Specimen: Nasopharyngeal Swab  Result Value Ref Range Status   SARS Coronavirus 2 NEGATIVE NEGATIVE Final    Comment: (NOTE) If result is NEGATIVE SARS-CoV-2 target nucleic acids are NOT DETECTED. The SARS-CoV-2 RNA is generally detectable in upper and lower  respiratory specimens during the acute phase of infection. The lowest  concentration of SARS-CoV-2 viral copies this assay can detect is 250  copies / mL. A negative result does not preclude SARS-CoV-2 infection  and should not be used as the sole basis for treatment or other  patient management decisions.  A negative result may occur with  improper specimen collection / handling, submission of specimen other  than nasopharyngeal swab, presence of viral mutation(s) within the  areas targeted by this assay, and inadequate number of viral copies  (<250 copies / mL). A negative result must be combined with clinical  observations,  patient history, and epidemiological information. If result is POSITIVE SARS-CoV-2 target nucleic acids are DETECTED. The SARS-CoV-2 RNA is generally detectable in upper and lower  respiratory specimens dur ing the acute phase of infection.  Positive  results are indicative of active infection with SARS-CoV-2.  Clinical  correlation with patient history and other diagnostic information is  necessary to determine patient infection status.  Positive results do  not rule out bacterial infection or co-infection with other viruses. If result is PRESUMPTIVE POSTIVE SARS-CoV-2 nucleic acids MAY BE PRESENT.   A presumptive positive result was obtained on the submitted specimen  and confirmed on repeat testing.  While 2019 novel coronavirus  (SARS-CoV-2) nucleic acids may be present in the submitted sample  additional confirmatory testing may be necessary for epidemiological  and / or clinical management purposes  to differentiate between  SARS-CoV-2 and other Sarbecovirus currently known to infect humans.  If clinically indicated additional testing with an alternate test  methodology [phone]) is advised. The SARS-CoV-2 RNA is generally  detectable in upper and lower respiratory sp ecimens during the acute  phase of infection. The expected result is Negative. Fact Sheet for Patients:  StrictlyIdeas.no Fact Sheet for Healthcare Providers: BankingDealers.co.za This test is not yet approved or cleared by the [place] FDA and has been authorized for detection and/or diagnosis of SARS-CoV-2 by FDA under an Emergency Use Authorization (EUA).  This EUA will remain in effect (meaning this test can be used) for the duration of the COVID-19 declaration under Section 564(b)(1) of the Act, 21 U.S.C. section 360bbb-3(b)(1), unless the authorization is terminated or revoked sooner. Performed at [facility], [place] [address], [place],  [place] [address]   Blood culture (routine x 2)     Status: None (Preliminary result)   Collection Time: 03/12/19  2:19 AM   Specimen: BLOOD LEFT HAND  Result Value Ref Range Status   Specimen Description BLOOD LEFT HAND  Final   Special Requests   Final    BOTTLES DRAWN AEROBIC ONLY Blood Culture adequate volume   Culture   Final    NO GROWTH 2 DAYS Performed at [facility], [place] [address], [place], [place] [address]    Report Status PENDING  Incomplete  Blood culture (routine x 2)     Status: None (Preliminary result)   Collection Time: 03/12/19  2:31 AM  Specimen: BLOOD RIGHT HAND  Result Value Ref Range Status   Specimen Description BLOOD RIGHT HAND  Final   Special Requests   Final    BOTTLES DRAWN AEROBIC ONLY Blood Culture adequate volume   Culture   Final    NO GROWTH 2 DAYS Performed at [facility], [address], [place], [place] [address]    Report Status PENDING  Incomplete    Radiology Studies: Dg Esophagus W Single Cm (sol Or Thin Ba)  Result Date: 03/14/2019 CLINICAL DATA:  Difficulty swallowing.  Previous thyroidectomy. EXAM: ESOPHOGRAM/BARIUM SWALLOW TECHNIQUE: Single contrast examination was performed using  thin barium. FLUOROSCOPY TIME:  Fluoroscopy Time: 1 minutes and 0 seconds of low-dose pulsed fluoroscopy. Radiation Exposure Index (if provided by the fluoroscopic device): 9.8 mGy Number of Acquired Spot Images: 0 COMPARISON:  Chest CT 12/20/2013.  Abdominal CT 03/11/2019. FINDINGS: The patient has limited mobility. The examination was performed in the supine, semi erect and RPO positions. The patient swallowed the barium without difficulty. No laryngeal penetration identified. There is narrowing of the cervical esophagus, likely due to a prominent cricopharyngeus impression on the esophagus. No mucosal irregularity is seen in this area. There is a decreased primary stripping wave and mild presbyesophagus. There is a small hiatal hernia with a non  restrictive distal esophageal ring. Possible mild smooth narrowing at the gastroesophageal junction, having a maximal diameter of 9 mm. In the semi erect position, a barium tablet was administered, and this passed rapidly into the distal esophagus. Despite drinking water and barium, no passage of this tablet into the stomach was observed during 3 minutes of intermittent fluoroscopic observation. The tablet passed freely through the cervical esophagus. IMPRESSION: 1. Possible mild stricture at the gastroesophageal junction, preventing the passage of a 13 mm barium tablet. The tablet was administered with the patient in the semi erect position which in itself may limit passage. 2. Mild esophageal dysmotility with presbyesophagus. No focal mucosal ulceration. 3. Prominent cricopharyngeus impression on the cervical esophagus, not delaying the passage of the tablet. 4. In review of recent noncontrast abdominal CT, multiple hypodense masses are identified within the liver, suspicious for neoplasm. Further evaluation with enhanced abdominal CT or MRI recommended. Electronically Signed   By: Richardean Sale M.D.   On: 03/14/2019 13:39     Scheduled Meds:  amLODipine  5 mg Oral Daily   lactulose  10 g Oral BID   levothyroxine  100 mcg Oral QAC breakfast   pantoprazole  40 mg Oral Q1200   Continuous Infusions:    LOS: 3 days   Time spent: 30 min  Little Ishikawa, DO [facility]  If 7PM-7AM, please contact night-coverage [email or web address] Password [facility] 03/15/2019, 8:37 AM

## 2019-03-15 NOTE — Care Management Important Message (Signed)
Important Message  Patient Details  Name: Shawna Fuentes MRN: [id number] Date of Birth: 11-Nov-1932   Medicare Important Message Given:  Yes     Memory Argue 03/15/2019, 3:32 PM

## 2019-03-15 NOTE — Progress Notes (Signed)
Pt refused CPAP for tonight.  She states it made her to dry.

## 2019-03-15 NOTE — Progress Notes (Signed)
Progress Note   Subjective  Patient feels about the same. Barium swallow done yesterday as above. She received some gentle hydration but renal function not significantly improved.   Objective   Vital signs in last 24 hours: Temp:  [97.6 F (36.4 C)-98.8 F (37.1 C)] 98.8 F (37.1 C) (09/10 0524) Pulse Rate:  [90-98] 96 (09/10 0524) Resp:  [17-18] 17 (09/09 1958) BP: (106-122)/(47-60) 122/60 (09/10 0524) SpO2:  [96 %-98 %] 98 % (09/10 0524) Last BM Date: 03/14/19 General:    AA female in NAD Heart:  Regular rate and rhythm;  Lungs: Respirations even and unlabored,  Abdomen:  Soft, nontender and nondistended.  Extremities:  Nonpitting edema. Neurologic:  Alert and oriented,  grossly normal neurologically. Psych:  Cooperative. Normal mood and affect.  Intake/Output from previous day: 09/09 0701 - 09/10 0700 In: 240 [P.O.:240] Out: 300 [Urine:300] Intake/Output this shift: No intake/output data recorded.  Lab Results: Recent Labs    03/13/19 0213 03/14/19 0239 03/15/19 0113  WBC 11.1* 11.9* 13.5*  HGB 7.9* 8.2* 8.0*  HCT 23.9* 25.2* 24.4*  PLT 258 272 276   BMET Recent Labs    03/13/19 0213 03/14/19 0239 03/15/19 0113  NA 133* 134* 134*  K 4.8 5.1 5.3*  CL 106 107 107  CO2 18* 17* 17*  GLUCOSE 80 90 79  BUN 32* 34* 35*  CREATININE 1.67* 2.16* 1.92*  CALCIUM 7.5* 7.4* 7.1*   LFT Recent Labs    03/12/19 1214  03/15/19 0113  PROT  --    < > 5.8*  ALBUMIN  --    < > 1.8*  AST  --    < > 108*  ALT  --    < > 31  ALKPHOS  --    < > 543*  BILITOT 7.8*   < > 9.6*  BILIDIR 4.9*  --   --   IBILI 2.9*  --   --    < > = values in this interval not displayed.   PT/INR No results for input(s): LABPROT, INR in the last 72 hours.  Studies/Results: Dg Esophagus W Single Cm (sol Or Thin Ba)  Result Date: 03/14/2019 CLINICAL DATA:  Difficulty swallowing.  Previous thyroidectomy. EXAM: ESOPHOGRAM/BARIUM SWALLOW TECHNIQUE: Single contrast examination was  performed using  thin barium. FLUOROSCOPY TIME:  Fluoroscopy Time: 1 minutes and 0 seconds of low-dose pulsed fluoroscopy. Radiation Exposure Index (if provided by the fluoroscopic device): 9.8 mGy Number of Acquired Spot Images: 0 COMPARISON:  Chest CT 12/20/2013.  Abdominal CT 03/11/2019. FINDINGS: The patient has limited mobility. The examination was performed in the supine, semi erect and RPO positions. The patient swallowed the barium without difficulty. No laryngeal penetration identified. There is narrowing of the cervical esophagus, likely due to a prominent cricopharyngeus impression on the esophagus. No mucosal irregularity is seen in this area. There is a decreased primary stripping wave and mild presbyesophagus. There is a small hiatal hernia with a non restrictive distal esophageal ring. Possible mild smooth narrowing at the gastroesophageal junction, having a maximal diameter of 9 mm. In the semi erect position, a barium tablet was administered, and this passed rapidly into the distal esophagus. Despite drinking water and barium, no passage of this tablet into the stomach was observed during 3 minutes of intermittent fluoroscopic observation. The tablet passed freely through the cervical esophagus. IMPRESSION: 1. Possible mild stricture at the gastroesophageal junction, preventing the passage of a 13 mm barium tablet. The tablet was  administered with the patient in the semi erect position which in itself may limit passage. 2. Mild esophageal dysmotility with presbyesophagus. No focal mucosal ulceration. 3. Prominent cricopharyngeus impression on the cervical esophagus, not delaying the passage of the tablet. 4. In review of recent noncontrast abdominal CT, multiple hypodense masses are identified within the liver, suspicious for neoplasm. Further evaluation with enhanced abdominal CT or MRI recommended. Electronically Signed   By: Richardean Sale M.D.   On: 03/14/2019 13:39       Assessment /  Plan:   85 y/o female admitted with the following issues:  Abnormal liver enzymes / jaundice / abnormal liver imaging - cirrhotic appearing liver on imaging, concerned for underlying mass lesion / malignancy but imaging is limited by lack of IV contrast. Spoke with radiology yesterday, would ideally like GFR above 30 to give IV contrast however despite hydration her renal function is not significantly improved today. Will proceed with noncontrast MRCP / MRI today to see if that will help show [place] something - we have held off on contrast MRI for the past 3 days and don't know when it will be safe to give contrast. If it remains limited or unclear, if her GFR can get above 30 can repeat the study with contrast, or she may have to accept risks of contrast with low GFR (with MRI contrast, risk remains low). Otherwise, labs starting to come back. AMA slightly positive but IgM normal, unclear significance of that. She may need a liver biopsy at some point pending her course. AFP sent yesterday and pending. Will await MRCP today.  Anemia / Diverticulitis / Dysphagia - heme positive stools, unclear etiology, and now with some dysphagia. Barium study yesterday shows dysmotility and ? stricture at GEJ. She warrants an EGD at some point to evaluate this, will tentatively add her to the schedule for this tomorrow.  She is being treated for diverticulitis and will need a follow up colonoscopy at some point in time as well. If she has multiple liver masses, colon cancer needs to be excluded.   Will follow, call with questions.  Romeo Cellar, MD [facility]

## 2019-03-15 NOTE — Progress Notes (Signed)
On my rounds I visited patient. She In formed me that she was mentally prepared for surgery. Patient asked to keep her procedure in prayer. Will continue to provide spiritual care as needed.

## 2019-03-15 NOTE — Progress Notes (Addendum)
Physical Therapy Treatment Patient Details Name: Shawna Fuentes MRN: [id number] DOB: 1933-01-03 Today's Date: 03/15/2019    History of Present Illness 85 year old female presents from home with decline in function & diverticulitis, acute kidney disease and cirrhosis. Pt with significiant PMH of OSA, hypertension, dyslipidemia, andhypothyroidism.    PT Comments    Encouragement needed for participation in therapy today. Pt is NPO awaiting procedure resulting in increased weakness. She required mod assist bed mobility, min assist to stand from elevated surface and min assist ambulation 5 feet with RW. Pt positioned in recliner with feet elevated at end of session.    Follow Up Recommendations  SNF;Supervision/Assistance - 24 hour     Equipment Recommendations  None recommended by PT(Pt has all needed DME: RW, BSC, wheelchair)    Recommendations for Other Services       Precautions / Restrictions Precautions Precautions: Fall    Mobility  Bed Mobility Overal bed mobility: Needs Assistance Bed Mobility: Supine to Sit     Supine to sit: HOB elevated;Mod assist     General bed mobility comments: +rail, cues for sequencing, use of bed pad to scoot to EOB  Transfers Overall transfer level: Needs assistance Equipment used: Rolling walker (2 wheeled) Transfers: Sit to/from Stand Sit to Stand: Min assist;From elevated surface         General transfer comment: Pt used momentum to stand. Increased time to stabilize initial standing balance.  Ambulation/Gait Ambulation/Gait assistance: Min assist Gait Distance (Feet): 5 Feet Assistive device: Rolling walker (2 wheeled) Gait Pattern/deviations: Decreased stride length;Wide base of support;Trunk flexed Gait velocity: decreased Gait velocity interpretation: <1.31 ft/sec, indicative of household ambulator General Gait Details: Increased time between steps. Cues for posture.   Stairs             Wheelchair Mobility     Modified Rankin (Stroke Patients Only)       Balance Overall balance assessment: Needs assistance Sitting-balance support: No upper extremity supported;Feet supported Sitting balance-Leahy Scale: Good     Standing balance support: Bilateral upper extremity supported;During functional activity Standing balance-Leahy Scale: Poor                              Cognition Arousal/Alertness: Awake/alert Behavior During Therapy: WFL for tasks assessed/performed Overall Cognitive Status: Within Functional Limits for tasks assessed                                 General Comments: cognition WFL, A&O x 4      Exercises      General Comments        Pertinent Vitals/Pain Pain Assessment: No/denies pain    Home Living                      Prior Function            PT Goals (current goals can now be found in the care plan section) Acute Rehab PT Goals Patient Stated Goal: home PT Goal Formulation: With patient Time For Goal Achievement: 03/27/19 Potential to Achieve Goals: Fair Progress towards PT goals: Progressing toward goals    Frequency    Min 3X/week      PT Plan Current plan remains appropriate    Co-evaluation              AM-PAC PT "6 Clicks" Mobility   Outcome  Measure  Help needed turning from your back to your side while in a flat bed without using bedrails?: A Little Help needed moving from lying on your back to sitting on the side of a flat bed without using bedrails?: A Lot Help needed moving to and from a bed to a chair (including a wheelchair)?: A Little Help needed standing up from a chair using your arms (e.g., wheelchair or bedside chair)?: A Lot Help needed to walk in hospital room?: A Little Help needed climbing 3-5 steps with a railing? : Total 6 Click Score: 14    End of Session Equipment Utilized During Treatment: Gait belt Activity Tolerance: Patient limited by fatigue(Pt currently NPO  awaiting procedure.) Patient left: in chair;with call bell/phone within reach Nurse Communication: Mobility status PT Visit Diagnosis: Unsteadiness on feet (R26.81);Muscle weakness (generalized) (M62.81);Other abnormalities of gait and mobility (R26.89)     Time: [id number] PT Time Calculation (min) (ACUTE ONLY): 16 min  Charges:  $Therapeutic Activity: 8-22 mins                     Lorrin Goodell, PT  Office # [phone] Pager [phone]    Shawna Fuentes 03/15/2019, 1:41 PM

## 2019-03-15 NOTE — Anesthesia Preprocedure Evaluation (Addendum)
Anesthesia Evaluation  Patient identified by MRN, date of birth, ID band Patient awake    Reviewed: Allergy & Precautions, H&P , NPO status , Patient's Chart, lab work & pertinent test results, reviewed documented beta blocker date and time   Airway Mallampati: I  TM Distance: >3 FB Neck ROM: Full    Dental  (+) Edentulous Upper, Edentulous Lower, Dental Advisory Given   Pulmonary sleep apnea ,    Pulmonary exam normal        Cardiovascular hypertension, Pt. on medications and Pt. on home beta blockers + DVT  Normal cardiovascular exam Rhythm:Regular Rate:Normal  2015 B/L UE DVTs   Neuro/Psych  Neuromuscular disease    GI/Hepatic (+) Cirrhosis       , Dysphagia Hx diverticulitis, cholelithiasis   Endo/Other  Hypothyroidism Morbid obesityBMI 44  Renal/GU CRFRenal diseaseCKD 3     Musculoskeletal  (+) Arthritis , Osteoarthritis,    Abdominal (+) + obese,   Peds  Hematology  (+) anemia ,   Anesthesia Other Findings Low back pain  Reproductive/Obstetrics                            Anesthesia Physical  Anesthesia Plan  ASA: III  Anesthesia Plan: MAC   Post-op Pain Management:    Induction: Intravenous  PONV Risk Score and Plan: 2 and Ondansetron, Dexamethasone and Propofol infusion  Airway Management Planned: Natural Airway and Nasal Cannula  Additional Equipment: None  Intra-op Plan:   Post-operative Plan:   Informed Consent: I have reviewed the patients History and Physical, chart, labs and discussed the procedure including the risks, benefits and alternatives for the proposed anesthesia with the patient or authorized representative who has indicated his/her understanding and acceptance.     Dental advisory given  Plan Discussed with: CRNA  Anesthesia Plan Comments:       Anesthesia Quick Evaluation

## 2019-03-15 NOTE — H&P (View-Only) (Signed)
Progress Note   Subjective  Patient feels about the same. Barium swallow done yesterday as above. She received some gentle hydration but renal function not significantly improved.   Objective   Vital signs in last 24 hours: Temp:  [97.6 F (36.4 C)-98.8 F (37.1 C)] 98.8 F (37.1 C) (09/10 0524) Pulse Rate:  [90-98] 96 (09/10 0524) Resp:  [17-18] 17 (09/09 1958) BP: (106-122)/(47-60) 122/60 (09/10 0524) SpO2:  [96 %-98 %] 98 % (09/10 0524) Last BM Date: 03/14/19 General:    AA female in NAD Heart:  Regular rate and rhythm;  Lungs: Respirations even and unlabored,  Abdomen:  Soft, nontender and nondistended.  Extremities:  Nonpitting edema. Neurologic:  Alert and oriented,  grossly normal neurologically. Psych:  Cooperative. Normal mood and affect.  Intake/Output from previous day: 09/09 0701 - 09/10 0700 In: 240 [P.O.:240] Out: 300 [Urine:300] Intake/Output this shift: No intake/output data recorded.  Lab Results: Recent Labs    03/13/19 0213 03/14/19 0239 03/15/19 0113  WBC 11.1* 11.9* 13.5*  HGB 7.9* 8.2* 8.0*  HCT 23.9* 25.2* 24.4*  PLT 258 272 276   BMET Recent Labs    03/13/19 0213 03/14/19 0239 03/15/19 0113  NA 133* 134* 134*  K 4.8 5.1 5.3*  CL 106 107 107  CO2 18* 17* 17*  GLUCOSE 80 90 79  BUN 32* 34* 35*  CREATININE 1.67* 2.16* 1.92*  CALCIUM 7.5* 7.4* 7.1*   LFT Recent Labs    03/12/19 1214  03/15/19 0113  PROT  --    < > 5.8*  ALBUMIN  --    < > 1.8*  AST  --    < > 108*  ALT  --    < > 31  ALKPHOS  --    < > 543*  BILITOT 7.8*   < > 9.6*  BILIDIR 4.9*  --   --   IBILI 2.9*  --   --    < > = values in this interval not displayed.   PT/INR No results for input(s): LABPROT, INR in the last 72 hours.  Studies/Results: Dg Esophagus W Single Cm (sol Or Thin Ba)  Result Date: 03/14/2019 CLINICAL DATA:  Difficulty swallowing.  Previous thyroidectomy. EXAM: ESOPHOGRAM/BARIUM SWALLOW TECHNIQUE: Single contrast examination was  performed using  thin barium. FLUOROSCOPY TIME:  Fluoroscopy Time: 1 minutes and 0 seconds of low-dose pulsed fluoroscopy. Radiation Exposure Index (if provided by the fluoroscopic device): 9.8 mGy Number of Acquired Spot Images: 0 COMPARISON:  Chest CT 12/20/2013.  Abdominal CT 03/11/2019. FINDINGS: The patient has limited mobility. The examination was performed in the supine, semi erect and RPO positions. The patient swallowed the barium without difficulty. No laryngeal penetration identified. There is narrowing of the cervical esophagus, likely due to a prominent cricopharyngeus impression on the esophagus. No mucosal irregularity is seen in this area. There is a decreased primary stripping wave and mild presbyesophagus. There is a small hiatal hernia with a non restrictive distal esophageal ring. Possible mild smooth narrowing at the gastroesophageal junction, having a maximal diameter of 9 mm. In the semi erect position, a barium tablet was administered, and this passed rapidly into the distal esophagus. Despite drinking water and barium, no passage of this tablet into the stomach was observed during 3 minutes of intermittent fluoroscopic observation. The tablet passed freely through the cervical esophagus. IMPRESSION: 1. Possible mild stricture at the gastroesophageal junction, preventing the passage of a 13 mm barium tablet. The tablet was  administered with the patient in the semi erect position which in itself may limit passage. 2. Mild esophageal dysmotility with presbyesophagus. No focal mucosal ulceration. 3. Prominent cricopharyngeus impression on the cervical esophagus, not delaying the passage of the tablet. 4. In review of recent noncontrast abdominal CT, multiple hypodense masses are identified within the liver, suspicious for neoplasm. Further evaluation with enhanced abdominal CT or MRI recommended. Electronically Signed   By: Richardean Sale M.D.   On: 03/14/2019 13:39       Assessment /  Plan:   85 y/o female admitted with the following issues:  Abnormal liver enzymes / jaundice / abnormal liver imaging - cirrhotic appearing liver on imaging, concerned for underlying mass lesion / malignancy but imaging is limited by lack of IV contrast. Spoke with radiology yesterday, would ideally like GFR above 30 to give IV contrast however despite hydration her renal function is not significantly improved today. Will proceed with noncontrast MRCP / MRI today to see if that will help show [place] something - we have held off on contrast MRI for the past 3 days and don't know when it will be safe to give contrast. If it remains limited or unclear, if her GFR can get above 30 can repeat the study with contrast, or she may have to accept risks of contrast with low GFR (with MRI contrast, risk remains low). Otherwise, labs starting to come back. AMA slightly positive but IgM normal, unclear significance of that. She may need a liver biopsy at some point pending her course. AFP sent yesterday and pending. Will await MRCP today.  Anemia / Diverticulitis / Dysphagia - heme positive stools, unclear etiology, and now with some dysphagia. Barium study yesterday shows dysmotility and ? stricture at GEJ. She warrants an EGD at some point to evaluate this, will tentatively add her to the schedule for this tomorrow.  She is being treated for diverticulitis and will need a follow up colonoscopy at some point in time as well. If she has multiple liver masses, colon cancer needs to be excluded.   Will follow, call with questions.  Everson Cellar, MD [facility]

## 2019-03-16 ENCOUNTER — Encounter (HOSPITAL_COMMUNITY)
Admission: EM | Disposition: A | Discharge status: Hospice | Payer: Self-pay | Source: Home / Self Care | Attending: Internal Medicine

## 2019-03-16 ENCOUNTER — Inpatient Hospital Stay (HOSPITAL_COMMUNITY): Payer: Medicare HMO | Admitting: Anesthesiology

## 2019-03-16 ENCOUNTER — Encounter (HOSPITAL_COMMUNITY): Payer: Self-pay

## 2019-03-16 DIAGNOSIS — K317 Polyp of stomach and duodenum: Secondary | ICD-10-CM

## 2019-03-16 DIAGNOSIS — C787 Secondary malignant neoplasm of liver and intrahepatic bile duct: Secondary | ICD-10-CM

## 2019-03-16 DIAGNOSIS — K222 Esophageal obstruction: Secondary | ICD-10-CM

## 2019-03-16 HISTORY — PX: BIOPSY: SHX5522

## 2019-03-16 HISTORY — PX: ESOPHAGOGASTRODUODENOSCOPY (EGD) WITH PROPOFOL: SHX5813

## 2019-03-16 HISTORY — PX: BALLOON DILATION: SHX5330

## 2019-03-16 LAB — CBC
HCT: 24.4 % — ABNORMAL LOW (ref 36.0–46.0)
Hemoglobin: 7.9 g/dL — ABNORMAL LOW (ref 12.0–15.0)
MCH: 26.5 pg (ref 26.0–34.0)
MCHC: 32.4 g/dL (ref 30.0–36.0)
MCV: 81.9 fL (ref 80.0–100.0)
Platelets: 287 10*3/uL (ref 150–400)
RBC: 2.98 MIL/uL — ABNORMAL LOW (ref 3.87–5.11)
RDW: 18.3 % — ABNORMAL HIGH (ref 11.5–15.5)
WBC: 18.1 10*3/uL — ABNORMAL HIGH (ref 4.0–10.5)
nRBC: 0.1 % (ref 0.0–0.2)

## 2019-03-16 LAB — COMPREHENSIVE METABOLIC PANEL
ALT: 33 U/L (ref 0–44)
AST: 146 U/L — ABNORMAL HIGH (ref 15–41)
Albumin: 1.8 g/dL — ABNORMAL LOW (ref 3.5–5.0)
Alkaline Phosphatase: 574 U/L — ABNORMAL HIGH (ref 38–126)
Anion gap: 13 (ref 5–15)
BUN: 44 mg/dL — ABNORMAL HIGH (ref 8–23)
CO2: 16 mmol/L — ABNORMAL LOW (ref 22–32)
Calcium: 6.8 mg/dL — ABNORMAL LOW (ref 8.9–10.3)
Chloride: 107 mmol/L (ref 98–111)
Creatinine, Ser: 2.36 mg/dL — ABNORMAL HIGH (ref 0.44–1.00)
GFR calc Af Amer: 21 mL/min — ABNORMAL LOW (ref >60)
GFR calc non Af Amer: 18 mL/min — ABNORMAL LOW (ref >60)
Glucose, Bld: 80 mg/dL (ref 70–99)
Potassium: 5.9 mmol/L — ABNORMAL HIGH (ref 3.5–5.1)
Sodium: 136 mmol/L (ref 135–145)
Total Bilirubin: 11.5 mg/dL — ABNORMAL HIGH (ref 0.3–1.2)
Total Protein: 5.7 g/dL — ABNORMAL LOW (ref 6.5–8.1)

## 2019-03-16 SURGERY — ESOPHAGOGASTRODUODENOSCOPY (EGD) WITH PROPOFOL
Anesthesia: Monitor Anesthesia Care

## 2019-03-16 MED ORDER — PROPOFOL 500 MG/50ML IV EMUL
INTRAVENOUS | Status: DC | PRN
  Administered 2019-03-16: 100 ug/kg/min via INTRAVENOUS

## 2019-03-16 MED ORDER — LACTATED RINGERS IV SOLN
INTRAVENOUS | Status: DC | PRN
  Administered 2019-03-16: 08:00:00 via INTRAVENOUS

## 2019-03-16 MED ORDER — PROPOFOL 10 MG/ML IV BOLUS
INTRAVENOUS | Status: DC | PRN
  Administered 2019-03-16: 10 mg via INTRAVENOUS
  Administered 2019-03-16: 20 mg via INTRAVENOUS

## 2019-03-16 MED ORDER — PHENYLEPHRINE 40 MCG/ML (10ML) SYRINGE FOR IV PUSH (FOR BLOOD PRESSURE SUPPORT)
PREFILLED_SYRINGE | INTRAVENOUS | Status: DC | PRN
  Administered 2019-03-16 (×5): 80 ug via INTRAVENOUS

## 2019-03-16 SURGICAL SUPPLY — 15 items

## 2019-03-16 NOTE — Progress Notes (Signed)
  MRI results reviewed and I discussed with the patient this AM. There is considerable tumor burden in the liver replacing most of the parenchyma, most consistent with metastatic infiltration, causing her jaundice. No obvious primary tumor on MRI, although I'm most concerned about a primary neoplasm from the colon causing this. She has dysphagia and wanting that addressed so she can eat better, so will proceed with EGD today, dilate her esophagus if needed, and rule out upper tract involvement / primary upper tract malignancy. If this is negative would consider colonoscopy to rule out primary colon cancer (although she did have a question of diverticulitis on prior CT, could reflect mass lesion) vs. liver biopsy to obtain a tissue diagnosis; however, given her extensive disease and age, she may be most appropriate for palliative care / hospice, and if she does not want therapy for this we would not need to obtain tissue or put her through a colonoscopy. Will need to discuss with Oncology and her family. Patient is understanding of the situation and the severity of her disease. EGD to be done this morning.   [place] Cellar, MD [facility]

## 2019-03-16 NOTE — Op Note (Signed)
[facility] Patient Name: Shawna Fuentes Procedure Date : 03/16/2019 MRN: [id number] Attending MD: Carlota Raspberry. Havery Moros , MD Date of Birth: 1932-10-15 CSN: [id number] Age: 85 Admit Type: Inpatient Procedure:                Upper GI endoscopy Indications:              Dysphagia - barium swallow with suspected stricture                            at the GEJ with some dysmotility, also MRCP showing                            multiple liver lesions concerning for metastatic                            disease, rule out upper tract primary source Providers:                Carlota Raspberry. Havery Moros, MD, Josie Dixon, RN,                            Angus Seller, Cherylynn Ridges, Technician, Lina Sar, Technician, Claybon Jabs CRNA, CRNA Referring MD:              Medicines:                Monitored Anesthesia Care Complications:            No immediate complications. Estimated blood loss:                            Minimal. Estimated Blood Loss:     Estimated blood loss was minimal. Procedure:                Pre-Anesthesia Assessment:                           - Prior to the procedure, a History and Physical                            was performed, and patient medications and                            allergies were reviewed. The patient's tolerance of                            previous anesthesia was also reviewed. The risks                            and benefits of the procedure and the sedation                            options and risks were discussed with the patient.  All questions were answered, and informed consent                            was obtained. Prior Anticoagulants: The patient has                            taken no previous anticoagulant or antiplatelet                            agents. ASA Grade Assessment: III - A patient with                            severe systemic disease. After reviewing the  risks                            and benefits, the patient was deemed in                            satisfactory condition to undergo the procedure.                           After obtaining informed consent, the endoscope was                            passed under direct vision. Throughout the                            procedure, the patient's blood pressure, pulse, and                            oxygen saturations were monitored continuously. The                            [id number] Olympus gastroscope was                            introduced through the mouth, and advanced to the                            second part of duodenum. The upper GI endoscopy was                            accomplished without difficulty. The patient                            tolerated the procedure well. Scope In: Scope Out: Findings:      Esophagogastric landmarks were identified: the Z-line was found at 38       cm, the gastroesophageal junction was found at 38 cm and the upper       extent of the gastric folds was found at 38 cm from the incisors.      One benign-appearing, intrinsic mild stenosis was found 38 cm from the       incisors. A TTS dilator was passed through the scope. Dilation  with a       15-16.5-18 mm balloon dilator was performed to 15 mm, 16.5 mm and 18 mm.      The exam of the esophagus was otherwise normal.      Two medium sessile polyps were found in the gastric fundus and in the       gastric antrum. Biopsies were taken with a cold forceps for histology,       ensure no dysplasia or gastric implants from known metastatic disease.      The stomach distended quite poorly which prolonged the exam. The exam of       the stomach was otherwise normal.      The duodenal bulb and second portion of the duodenum were normal, the       duodenal sweep was quite angulated. Impression:               - Esophagogastric landmarks identified.                           - Benign-appearing  esophageal stenosis. Dilated to                            66mm.                           - Normal esophagus                           - Two gastric polyps. Biopsied.                           - Poor distension of the stomach                           - Normal stomach otherwise                           - Normal duodenal bulb and second portion of the                            duodenum.                           Unclear if mild stenosis vs. dysmotility is causing                            the patient's dysphagia. No primary malignancy                            noted in the upper GI tract in regards to MRCP                            findings. Recommendation:           - Return patient to hospital ward for ongoing care.                           - Resume previous diet.                           -  Continue present medications.                           - Await pathology results.                           - Other recommendations per inpatient note as                            outlined Procedure Code(s):        --- Professional ---                           [phone], Esophagogastroduodenoscopy, flexible,                            transoral; with transendoscopic balloon dilation of                            esophagus (less than 30 mm diameter)                           [id number], 59, Esophagogastroduodenoscopy, flexible,                            transoral; with biopsy, single or multiple Diagnosis Code(s):        --- Professional ---                           K22.2, Esophageal obstruction                           K31.7, Polyp of stomach and duodenum                           R13.10, Dysphagia, unspecified CPT copyright 2019 American Medical Association. All rights reserved. The codes documented in this report are preliminary and upon coder review may  be revised to meet current compliance requirements. Remo Lipps P. Maigen Mozingo, MD 03/16/2019 8:59:42 AM This report has been signed  electronically. Number of Addenda: 0

## 2019-03-16 NOTE — Interval H&P Note (Signed)
History and Physical Interval Note:  03/16/2019 7:41 AM  Shawna Fuentes  has presented today for surgery, with the diagnosis of dysphagia, anemia.  The various methods of treatment have been discussed with the patient and family. After consideration of risks, benefits and other options for treatment, the patient has consented to  Procedure(s): ESOPHAGOGASTRODUODENOSCOPY (EGD) WITH PROPOFOL (N/A) as a surgical intervention.  The patient's history has been reviewed, patient examined, no change in status, stable for surgery.  I have reviewed the patient's chart and labs.  Questions were answered to the patient's satisfaction.     [facility]

## 2019-03-16 NOTE — Progress Notes (Signed)
PT Cancellation Note  Patient Details Name: Shawna Fuentes MRN: [id number] DOB: May 15, 1933   Cancelled Treatment:    Reason Eval/Treat Not Completed: Patient at procedure or test/unavailable (EGD).  Ellamae Sia, PT, DPT Acute Rehabilitation Services Pager [phone] Office [phone]    Willy Eddy 03/16/2019, 7:44 AM

## 2019-03-16 NOTE — Anesthesia Postprocedure Evaluation (Signed)
Anesthesia Post Note  Patient: Shawna Fuentes  Procedure(s) Performed: ESOPHAGOGASTRODUODENOSCOPY (EGD) WITH PROPOFOL (N/A ) BIOPSY BALLOON DILATION (N/A )     Anesthesia Post Evaluation  Last Vitals:  Vitals:   03/16/19 0859 03/16/19 0907  BP: (!) 103/57 (!) 105/50  Pulse: (!) 110 (!) 109  Resp: 20 16  Temp: 37 C   SpO2: 92% 94%    Last Pain:  Vitals:   03/16/19 0907  TempSrc:   PainSc: 0-No pain                 Pervis Hocking

## 2019-03-16 NOTE — Progress Notes (Signed)
Pt states she does not want to wear oxygen "I have my oxygen on". I  Asked her if she wanted to wear the oxygen instead of CPAP and she stated yes.

## 2019-03-16 NOTE — Progress Notes (Addendum)
PROGRESS NOTE  Shawna Fuentes  MRN:[id number] DOB: 03/07/1933 DOA: 03/11/2019 PCP: Burns, Stacy J, MD  Brief Narrative:  Shawna Fuentes is a 86 y.o. female with medical history significant of DVT, HTN, OSA chronic resp failure with hypoxia, CKD stage 3. Patient presents to the ED with c/o 1 week history of SOB, generalized weakness, loss of appetite.  Son passed away on Friday.  Symptoms ongoing since that time. She has had increased leg swelling recently for which PCP increased dose of Lasix.  Hasnt helped symptoms. She also reports occasional mid left lower abdominal pain with constipation. No vomiting, had some nausea after trying to eat, drinking ensure. In ED despite the initial suspicion that symptoms would be due to grief.  She actually has multiple medical abnormalities and findings. Acute diverticulitis demonstrated on CT scan, wbc 12.4K. Anemia with HGB 8.2 down from 13.3 baseline in Dec last year.  Trace positive hemoccult though no frank large GIB. Apparent new diagnosis of cirrhosis appearance of liver on imaging, some question of mass vs nodular appearance on CT scan though US didn't show mass.  Ammonia of 65, tbili of 8, AST 106, ALT 43, lipase 77, ALK 542. Creat 2.1 up from 1.2 baseline. Sodium 130. Cholelithiasis without acute cholecystitis. Patient admitted for further GI workup given lab abnormalities and findings on imaging to rule out overt cause of anemia.  Assessment & Plan:   Principal Problem:   Metastatic carcinoma involving liver with unknown primary site (HCC) Active Problems:   Hypertension   Edema, bilateral legs   Chronic respiratory failure with hypercapnia (HCC)   Obstructive sleep apnea   Acute diverticulitis   Hyperammonemia (HCC)   Cirrhosis of liver (HCC)   Cholelithiasis   Anemia   Acute renal failure superimposed on stage 3 chronic kidney disease (HCC)   Elevated LFTs   Hyperbilirubinemia   Dysphagia    Abnormal LFTs Likely 2/2 metastatic tumor burden  - unknown primary -Per MRCP -Discussed case with GI, upper endoscopy today remarkable for small stricture otherwise some small polyps biopsied to ensure no further metastases -Sideline discussion with oncology today indicates this is a very advanced stage static disease, further work-up, imaging, biopsy would have little to no effect on patient's outcome, treatment would be admitted given patient's advanced age, elevated creatinine, and advanced metastatic disease as shown on imaging above. -At this time patient understands her diagnosis, I have attempted to call her son old with no answer, will attempt again later this afternoon -We will consult palliative care to further discuss patient's case, offer further guidance.  AKI without known history of CKD, ongoing - Continue low IVF - MRCP to be performed without contrast given GFR below 30 - Baseline creatinine is 1.2 from last year now with creatinine of 2.4, suspect this is secondary to poor p.o. intake in the setting of recent diuretics and ARB use - Lasix, ARB on hold - ? hepatorenal syndrome given above  Anemia, likely chronic anemia of chronic disease/in the setting of metastatic disease given above, stable - Secondary to chronic disease and iron deficiency as well as above malignancy - Hemoccult is positive previously, no overt bleeding -Stable at 8.0 -Hold off on transfusion unless symptomatic -as low as 7.3, now uptrending after IV iron  ?  Diverticulitis - Unlikely - clinically benign - tolerating PO well - no issues - Hold abx and follow clinically  Obesity/OSA - Continue CPAP nightly  DVT prophylaxis: currently holding given hemoccult positive in ED Code   Status: Full Family discussion: Multiple attempts to contact patient's son [name] Disposition Plan: Continue as inpatient, likely disposition home after discussion with palliative care, unclear what patient's goals and wishes will be given her new diagnosis as above.   Consultants:   GI, palliative care  Antimicrobials:  Cipro/flagyl stopped 03/14/2019  Subjective: No acute issues or events overnight, denies chest pain, shortness of breath, nausea, vomiting, diarrhea, constipation, headache, fevers, chills.  Objective: Vitals:   03/16/19 0859 03/16/19 0907 03/16/19 1008 03/16/19 1420  BP: (!) 103/57 (!) 105/50 (!) 113/53 (!) 115/47  Pulse: (!) 110 (!) 109 91 (!) 110  Resp: _0 Temp: 98.6 F (37 C)  98.2 F (36.8 C) 97.8 F (36.6 C)  TempSrc: Oral  Oral Oral  SpO2: 92% 94% 96% 98%  Weight:      Height:        Intake/Output Summary (Last 24 hours) at 03/16/2019 1431 Last data filed at 03/16/2019 1414 Gross per 24 hour  Intake 220 ml  Output -  Net 220 ml   Filed Weights   03/11/19 1706  Weight: 113.4 kg    Examination:  General:  Pleasantly resting in bed, No acute distress. HEENT:  Normocephalic atraumatic.  Sclerae nonicteric, noninjected.  Extraocular movements intact bilaterally. Neck:  Without mass or deformity.  Trachea is midline. Lungs:  Clear to auscultate bilaterally without rhonchi, wheeze, or rales. Heart:  Regular rate and rhythm.  Without murmurs, rubs, or gallops. Abdomen:  Soft, nontender, nondistended.  Without guarding or rebound. Extremities: Without cyanosis, clubbing, edema, or obvious deformity. Vascular:  Dorsalis pedis and posterior tibial pulses palpable bilaterally. Skin:  Warm and dry, no erythema, no ulcerations.   Data Reviewed: I have personally reviewed following labs and imaging studies  CBC: Recent Labs  Lab 03/11/19 2040  03/12/19 0231 03/13/19 0213 03/14/19 0239 03/15/19 0113 03/16/19 1028  WBC 12.4*  --  6.5 11.1* 11.9* 13.5* 18.1*  NEUTROABS 9.9*  --   --   --   --   --   --   HGB 8.2*   < > 7.3* 7.9* 8.2* 8.0* 7.9*  HCT 25.9*   < > 23.2* 23.9* 25.2* 24.4* 24.4*  MCV 83.3  --  85.3 80.7 80.8 80.3 81.9  PLT 248  --  PLATELET CLUMPS NOTED ON SMEAR, UNABLE TO ESTIMATE 258 272  276 287   < > = values in this interval not displayed.   Basic Metabolic Panel: Recent Labs  Lab 03/12/19 0231 03/13/19 0213 03/14/19 0239 03/15/19 0113 03/16/19 1028  NA 133* 133* 134* 134* 136  K 4.9 4.8 5.1 5.3* 5.9*  CL 105 106 107 107 107  CO2 14* 18* 17* 17* 16*  GLUCOSE 93 80 90 79 80  BUN 36* 32* 34* 35* 44*  CREATININE 2.05* 1.67* 2.16* 1.92* 2.36*  CALCIUM 7.8* 7.5* 7.4* 7.1* 6.8*   GFR: Estimated Creatinine Clearance: 21.5 mL/min (A) (by C-G formula based on SCr of 2.36 mg/dL (H)).   Liver Function Tests: Recent Labs  Lab 03/12/19 0231 03/12/19 1214 03/13/19 0213 03/14/19 0239 03/15/19 0113 03/16/19 1028  AST 102*  --  98* 91* 108* 146*  ALT 39  --  36 32 31 33  ALKPHOS 606*  --  579* 528* 543* 574*  BILITOT 8.2* 7.8* 8.2* 8.7* 9.6* 11.5*  PROT 6.3*  --  5.9* 5.6* 5.8* 5.7*  ALBUMIN 2.2*  --  2.0* 1.8* 1.8* 1.8*   Recent Labs  Lab 03/11/19  2040  LIPASE 77*   Recent Labs  Lab 03/11/19 2208  AMMONIA 65*   Coagulation Profile: Recent Labs  Lab 03/11/19 2208  INR 1.5*   Sepsis Labs: Recent Labs  Lab 03/12/19 0220 03/12/19 0546  LATICACIDVEN 1.5 0.9    Recent Results (from the past 240 hour(s))  SARS Coronavirus 2 [facility] order, Performed in [facility] hospital lab) Nasopharyngeal Nasopharyngeal Swab     Status: None   Collection Time: 03/11/19  8:49 PM   Specimen: Nasopharyngeal Swab  Result Value Ref Range Status   SARS Coronavirus 2 NEGATIVE NEGATIVE Final    Comment: (NOTE) If result is NEGATIVE SARS-CoV-2 target nucleic acids are NOT DETECTED. The SARS-CoV-2 RNA is generally detectable in upper and lower  respiratory specimens during the acute phase of infection. The lowest  concentration of SARS-CoV-2 viral copies this assay can detect is 250  copies / mL. A negative result does not preclude SARS-CoV-2 infection  and should not be used as the sole basis for treatment or other  patient management decisions.  A negative result  may occur with  improper specimen collection / handling, submission of specimen other  than nasopharyngeal swab, presence of viral mutation(s) within the  areas targeted by this assay, and inadequate number of viral copies  (<250 copies / mL). A negative result must be combined with clinical  observations, patient history, and epidemiological information. If result is POSITIVE SARS-CoV-2 target nucleic acids are DETECTED. The SARS-CoV-2 RNA is generally detectable in upper and lower  respiratory specimens dur ing the acute phase of infection.  Positive  results are indicative of active infection with SARS-CoV-2.  Clinical  correlation with patient history and other diagnostic information is  necessary to determine patient infection status.  Positive results do  not rule out bacterial infection or co-infection with other viruses. If result is PRESUMPTIVE POSTIVE SARS-CoV-2 nucleic acids MAY BE PRESENT.   A presumptive positive result was obtained on the submitted specimen  and confirmed on repeat testing.  While 2019 novel coronavirus  (SARS-CoV-2) nucleic acids may be present in the submitted sample  additional confirmatory testing may be necessary for epidemiological  and / or clinical management purposes  to differentiate between  SARS-CoV-2 and other Sarbecovirus currently known to infect humans.  If clinically indicated additional testing with an alternate test  methodology [phone]) is advised. The SARS-CoV-2 RNA is generally  detectable in upper and lower respiratory sp ecimens during the acute  phase of infection. The expected result is Negative. Fact Sheet for Patients:  StrictlyIdeas.no Fact Sheet for Healthcare Providers: BankingDealers.co.za This test is not yet approved or cleared by the [place] FDA and has been authorized for detection and/or diagnosis of SARS-CoV-2 by FDA under an Emergency Use Authorization (EUA).   This EUA will remain in effect (meaning this test can be used) for the duration of the COVID-19 declaration under Section 564(b)(1) of the Act, 21 U.S.C. section 360bbb-3(b)(1), unless the authorization is terminated or revoked sooner. Performed at [facility], [place] [address] [place], [place] [address]   Blood culture (routine x 2)     Status: None (Preliminary result)   Collection Time: 03/12/19  2:19 AM   Specimen: BLOOD LEFT HAND  Result Value Ref Range Status   Specimen Description BLOOD LEFT HAND  Final   Special Requests   Final    BOTTLES DRAWN AEROBIC ONLY Blood Culture adequate volume   Culture   Final    NO GROWTH  4 DAYS Performed at [facility], [address] [place], [place] [address]    Report Status PENDING  Incomplete  Blood culture (routine x 2)     Status: None (Preliminary result)   Collection Time: 03/12/19  2:31 AM   Specimen: BLOOD RIGHT HAND  Result Value Ref Range Status   Specimen Description BLOOD RIGHT HAND  Final   Special Requests   Final    BOTTLES DRAWN AEROBIC ONLY Blood Culture adequate volume   Culture   Final    NO GROWTH 4 DAYS Performed at [facility], [address] [place],  [address]    Report Status PENDING  Incomplete    Radiology Studies: Mr Abdomen Mrcp Wo Contrast  Result Date: 03/15/2019 CLINICAL DATA:  Inpatient. Abnormal liver enzymes. Jaundice. Acute renal failure. Suggestion of liver masses on recent noncontrast CT. EXAM: MRI ABDOMEN WITHOUT CONTRAST  (INCLUDING MRCP) TECHNIQUE: Multiplanar multisequence MR imaging of the abdomen was performed. Heavily T2-weighted images of the biliary and pancreatic ducts were obtained, and three-dimensional MRCP images were rendered by post processing. COMPARISON:  03/11/2019 CT abdomen/pelvis. FINDINGS: Lower chest: Small dependent right pleural effusion. Hepatobiliary: Hepatomegaly. There are innumerable (> than 20) bulky confluent similar mildly T2 hyperintense  liver masses replacing much of the liver parenchyma. Representative 7.4 x 4.5 cm inferior right liver mass (series 5/image 29), 4.3 x 4.0 cm segment 7 right liver mass (series 5/image 10) and 5.7 x 3.8 cm central left liver mass (series 5/image 23). There is background iron deposition throughout the liver parenchyma. Distended gallbladder is completely filled with gallstones measuring up to 1.2 cm in size. Mild diffuse gallbladder wall thickening. No significant pericholecystic fluid. There is scattered mild intrahepatic biliary ductal dilatation throughout the liver. Common bile duct diameter 7 mm, mildly dilated proximal. No evidence of choledocholithiasis. Pancreas: No pancreatic mass or duct dilation.  No pancreas divisum. 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 No splenic mass. Normal size spleen. Adrenals/Urinary Tract: Normal adrenals. No hydronephrosis. Simple appearing bilateral renal cysts, largest 4.5 cm in the posterior interpolar right kidney, incompletely characterized on this noncontrast MRI. No overtly suspicious renal masses. Abnormal T2 hyperintensity throughout the renal cortex bilaterally with abnormal accentuation of the corticomedullary signal differentiation. Stomach/Bowel: Normal non-distended stomach. Visualized small and large bowel is normal caliber, with no bowel wall thickening. Vascular/Lymphatic: Normal caliber abdominal aorta. No pathologically enlarged lymph nodes in the abdomen. Other: No abdominal ascites or focal fluid collection. Musculoskeletal: Large T2 hyperintense vertebral lesions at L1 and L3, compatible with vertebral hemangiomas in correlation with the recent CT study. IMPRESSION: 1. Innumerable bulky confluent liver masses replacing much of the liver, most compatible with metastatic disease. A primary neoplasm is not evident on this abdomen MRI study. 2. Distended gallbladder is completely filled with gallstones with nonspecific mild diffuse gallbladder wall thickening. No  pericholecystic fluid. Acute cholecystitis cannot be excluded by MRI. 3. Scattered mild intrahepatic biliary ductal dilatation throughout the liver probably due to extrinsic mass effect on the central intrahepatic bile ducts by the bulky liver masses. Mild proximal dilatation of the common bile duct (7 mm diameter). No evidence of choledocholithiasis. 4. Small dependent right pleural effusion. 5. Diffuse iron deposition in the liver and spleen, presumably transfusion related. Electronically Signed   By: Jason A Poff M.D.   On: 03/15/2019 17:28   Mr 3d Recon At Scanner  Result Date: 03/15/2019 CLINICAL DATA:  Inpatient. Abnormal liver enzymes. Jaundice. Acute renal failure. Suggestion of liver masses on   recent noncontrast CT. EXAM: MRI ABDOMEN WITHOUT CONTRAST  (INCLUDING MRCP) TECHNIQUE: Multiplanar multisequence MR imaging of the abdomen was performed. Heavily T2-weighted images of the biliary and pancreatic ducts were obtained, and three-dimensional MRCP images were rendered by post processing. COMPARISON:  03/11/2019 CT abdomen/pelvis. FINDINGS: Lower chest: Small dependent right pleural effusion. Hepatobiliary: Hepatomegaly. There are innumerable (> than 20) bulky confluent similar mildly T2 hyperintense liver masses replacing much of the liver parenchyma. Representative 7.4 x 4.5 cm inferior right liver mass (series 5/image 29), 4.3 x 4.0 cm segment 7 right liver mass (series 5/image 10) and 5.7 x 3.8 cm central left liver mass (series 5/image 23). There is background iron deposition throughout the liver parenchyma. Distended gallbladder is completely filled with gallstones measuring up to 1.2 cm in size. Mild diffuse gallbladder wall thickening. No significant pericholecystic fluid. There is scattered mild intrahepatic biliary ductal dilatation throughout the liver. Common bile duct diameter 7 mm, mildly dilated proximal. No evidence of choledocholithiasis. Pancreas: No pancreatic mass or duct dilation.   No pancreas divisum. 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 No splenic mass. Normal size spleen. Adrenals/Urinary Tract: Normal adrenals. No hydronephrosis. Simple appearing bilateral renal cysts, largest 4.5 cm in the posterior interpolar right kidney, incompletely characterized on this noncontrast MRI. No overtly suspicious renal masses. Abnormal T2 hyperintensity throughout the renal cortex bilaterally with abnormal accentuation of the corticomedullary signal differentiation. Stomach/Bowel: Normal non-distended stomach. Visualized small and large bowel is normal caliber, with no bowel wall thickening. Vascular/Lymphatic: Normal caliber abdominal aorta. No pathologically enlarged lymph nodes in the abdomen. Other: No abdominal ascites or focal fluid collection. Musculoskeletal: Large T2 hyperintense vertebral lesions at L1 and L3, compatible with vertebral hemangiomas in correlation with the recent CT study. IMPRESSION: 1. Innumerable bulky confluent liver masses replacing much of the liver, most compatible with metastatic disease. A primary neoplasm is not evident on this abdomen MRI study. 2. Distended gallbladder is completely filled with gallstones with nonspecific mild diffuse gallbladder wall thickening. No pericholecystic fluid. Acute cholecystitis cannot be excluded by MRI. 3. Scattered mild intrahepatic biliary ductal dilatation throughout the liver probably due to extrinsic mass effect on the central intrahepatic bile ducts by the bulky liver masses. Mild proximal dilatation of the common bile duct (7 mm diameter). No evidence of choledocholithiasis. 4. Small dependent right pleural effusion. 5. Diffuse iron deposition in the liver and spleen, presumably transfusion related. Electronically Signed   By: Ilona Sorrel M.D.   On: 03/15/2019 17:28     Scheduled Meds: . amLODipine  5 mg Oral Daily  . lactulose  10 g Oral BID  . levothyroxine  100 mcg Oral QAC breakfast  . pantoprazole  40 mg Oral Q1200      LOS: 4 days   Time spent: 30 min  Little Ishikawa, DO [facility]  If 7PM-7AM, please contact night-coverage [email or web address] Password [facility] 03/16/2019, 2:31 PM

## 2019-03-16 NOTE — Progress Notes (Signed)
OT Cancellation Note  Patient Details Name: Shawna Fuentes MRN: [id number] DOB: [birth year]   Cancelled Treatment:     Pt decline OT  session this afternoon d/t not feeling well and still feeling dizzy from procedure this morning. Will check back as time allows.    [place], [facility] [place] 03/16/2019, 2:18 PM

## 2019-03-16 NOTE — Transfer of Care (Signed)
Immediate Anesthesia Transfer of Care Note  Patient: Shawna Fuentes  Procedure(s) Performed: ESOPHAGOGASTRODUODENOSCOPY (EGD) WITH PROPOFOL (N/A ) BIOPSY  Patient Location: Endoscopy Unit  Anesthesia Type:MAC  Level of Consciousness: drowsy and patient cooperative  Airway & Oxygen Therapy: Patient Spontanous Breathing and Patient connected to nasal cannula oxygen  Post-op Assessment: Report given to RN, Post -op Vital signs reviewed and stable and Patient moving all extremities  Post vital signs: Reviewed and stable  Last Vitals:  Vitals Value Taken Time  BP 103/57 03/16/19 0857  Temp    Pulse 110 03/16/19 0857  Resp 22 03/16/19 0857  SpO2 94 % 03/16/19 0857  Vitals shown include unvalidated device data.  Last Pain:  Vitals:   03/16/19 0743  TempSrc: Oral  PainSc: 0-No pain         Complications: No apparent anesthesia complications

## 2019-03-17 DIAGNOSIS — Z515 Encounter for palliative care: Secondary | ICD-10-CM

## 2019-03-17 LAB — CULTURE, BLOOD (ROUTINE X 2)
Culture: NO GROWTH
Culture: NO GROWTH
Special Requests: ADEQUATE
Special Requests: ADEQUATE

## 2019-03-17 LAB — COMPREHENSIVE METABOLIC PANEL
ALT: 37 U/L (ref 0–44)
AST: 187 U/L — ABNORMAL HIGH (ref 15–41)
Albumin: 1.9 g/dL — ABNORMAL LOW (ref 3.5–5.0)
Alkaline Phosphatase: 626 U/L — ABNORMAL HIGH (ref 38–126)
Anion gap: 10 (ref 5–15)
BUN: 50 mg/dL — ABNORMAL HIGH (ref 8–23)
CO2: 16 mmol/L — ABNORMAL LOW (ref 22–32)
Calcium: 7.1 mg/dL — ABNORMAL LOW (ref 8.9–10.3)
Chloride: 111 mmol/L (ref 98–111)
Creatinine, Ser: 2.67 mg/dL — ABNORMAL HIGH (ref 0.44–1.00)
GFR calc Af Amer: 18 mL/min — ABNORMAL LOW (ref >60)
GFR calc non Af Amer: 16 mL/min — ABNORMAL LOW (ref >60)
Glucose, Bld: 73 mg/dL (ref 70–99)
Potassium: 6.2 mmol/L — ABNORMAL HIGH (ref 3.5–5.1)
Sodium: 137 mmol/L (ref 135–145)
Total Bilirubin: 12.3 mg/dL — ABNORMAL HIGH (ref 0.3–1.2)
Total Protein: 6.4 g/dL — ABNORMAL LOW (ref 6.5–8.1)

## 2019-03-17 LAB — CBC
HCT: 25.3 % — ABNORMAL LOW (ref 36.0–46.0)
Hemoglobin: 8.2 g/dL — ABNORMAL LOW (ref 12.0–15.0)
MCH: 26.7 pg (ref 26.0–34.0)
MCHC: 32.4 g/dL (ref 30.0–36.0)
MCV: 82.4 fL (ref 80.0–100.0)
Platelets: 303 10*3/uL (ref 150–400)
RBC: 3.07 MIL/uL — ABNORMAL LOW (ref 3.87–5.11)
RDW: 18.6 % — ABNORMAL HIGH (ref 11.5–15.5)
WBC: 17.8 10*3/uL — ABNORMAL HIGH (ref 4.0–10.5)
nRBC: 0.2 % (ref 0.0–0.2)

## 2019-03-17 MED ORDER — HYDROMORPHONE HCL 2 MG PO TABS: 1.0000 mg | ORAL_TABLET | ORAL | Status: DC | PRN

## 2019-03-17 NOTE — Progress Notes (Signed)
PROGRESS NOTE  Shawna Fuentes  VQQ:[id number] DOB: 02/24/1933 DOA: 03/11/2019 PCP: Binnie Rail, MD  Brief Narrative:  Shawna Fuentes is a 85 y.o. female with medical history significant of DVT, HTN, OSA chronic resp failure with hypoxia, CKD stage 3. Patient presents to the ED with c/o 1 week history of SOB, generalized weakness, loss of appetite.  Son passed away on 09/04/2022.  Symptoms ongoing since that time. She has had increased leg swelling recently for which PCP increased dose of Lasix.  Hasnt helped symptoms. She also reports occasional mid left lower abdominal pain with constipation. No vomiting, had some nausea after trying to eat, drinking ensure. In ED despite the initial suspicion that symptoms would be due to grief.  She actually has multiple medical abnormalities and findings. Acute diverticulitis demonstrated on CT scan, wbc 12.4K. Anemia with HGB 8.2 down from 13.3 baseline in Dec last year.  Trace positive hemoccult though no frank large GIB. Apparent new diagnosis of cirrhosis appearance of liver on imaging, some question of mass vs nodular appearance on CT scan though [place] didn't show mass.  Ammonia of 65, tbili of 8, AST 106, ALT 43, lipase 77, ALK 542. Creat 2.1 up from 1.2 baseline. Sodium 130. Cholelithiasis without acute cholecystitis. Patient admitted for further GI workup given lab abnormalities and findings on imaging to rule out overt cause of anemia.  Patient admitted as above with no malaise, weakness, loss of appetite -recent death of her son prior to admission.  Initially unclear etiology, rating p.o. quite poorly.  GI was consulted, recommended other imaging of the abdomen with MRCP and EGD.  Unfortunately MRCP cannot be done with contrast due to patient's worsening kidney disease but without contrast very large tumor burden was noted in the liver, likely to be distant metastasis from unknown primary.  Heme-onc sidelined, who recommended against any further aggressive work-up  given patient's advanced disease would likely not improve or prolong her life in any meaningful way.  At this time family and patient are agreeable to hospice at home, currently awaiting medical supply delivery of hospital bed and other needs prior to disposition home with hospice.  [facility] is following along as is hospice, we appreciate insight recommendations, patient's PCP notified as well.  Assessment & Plan:  Principal Problem:   Metastatic carcinoma involving liver with unknown primary site [facility]) Active Problems:   Hypertension   Edema, bilateral legs   Chronic respiratory failure with hypercapnia (HCC)   Obstructive sleep apnea   Acute diverticulitis   Hyperammonemia (HCC)   Cirrhosis of liver (HCC)   Cholelithiasis   Anemia   Acute renal failure superimposed on stage 3 chronic kidney disease (HCC)   Elevated LFTs   Hyperbilirubinemia   Dysphagia   Abnormal LFTs Likely 2/2 metastatic tumor burden - unknown primary -Per MRCP -Discussed case with GI, upper endoscopy remarkable for small stricture otherwise some small polyps biopsied to ensure no further metastases -Sideline discussion with oncology indicates this is a very advanced stage static disease, further work-up, imaging, biopsy would have little to no effect on patient's outcome, treatment would be admitted given patient's advanced age, elevated creatinine, and advanced metastatic disease as shown on imaging above. -At this time patient understands her diagnosis, palliative care following, lengthy discussion today with family indicates likely hospice at home pending case management and insurance approval for hospital bed and other equipment as needed per family and patient.  AKI without known history of CKD, ongoing -  Continue low IVF - MRCP to be performed without contrast given GFR below 30 - Baseline creatinine is 1.2 from last year now with creatinine of 2.4, suspect this is secondary to poor p.o. intake in the  setting of recent diuretics and ARB use - Lasix, ARB on hold - ? hepatorenal syndrome given above  Anemia, likely chronic anemia of chronic disease/in the setting of metastatic disease given above, stable - Secondary to chronic disease and iron deficiency as well as above malignancy - Hemoccult is positive previously, no overt bleeding -Stable at 8.0 -Hold off on transfusion unless symptomatic -as low as 7.3, now uptrending after IV iron  ?  Diverticulitis - Unlikely - clinically benign - tolerating PO well - no issues - Hold abx and follow clinically  Obesity/OSA - Continue CPAP nightly  DVT prophylaxis: currently holding given hemoccult positive in ED Code Status: Full Disposition Plan: Likely discharge home with hospice in the next 24 to 48 hours pending durable medical equipment delivery.  Consultants:   GI, palliative care  Antimicrobials:  Cipro/flagyl stopped 03/14/2019  Subjective: No acute issues or events overnight, denies chest pain, shortness of breath, nausea, vomiting, diarrhea, constipation, headache, fevers, chills.  Objective: Vitals:   03/16/19 1008 03/16/19 1420 03/16/19 2024 03/17/19 0411  BP: (!) 113/53 (!) 115/47 (!) 106/40 (!) 113/42  Pulse: 91 (!) 110 (!) 110 87  Resp: _0 Temp: 98.2 F (36.8 C) 97.8 F (36.6 C) 98.3 F (36.8 C) 98.7 F (37.1 C)  TempSrc: Oral Oral Oral Oral  SpO2: 96% 98% 95% 96%  Weight:      Height:        Intake/Output Summary (Last 24 hours) at 03/17/2019 0820 Last data filed at 03/17/2019 5809 Gross per 24 hour  Intake 580 ml  Output 400 ml  Net 180 ml   Filed Weights   03/11/19 1706  Weight: 113.4 kg    Examination:  General:  Pleasantly resting in bed, No acute distress. HEENT:  Normocephalic atraumatic.  Sclerae nonicteric, noninjected.  Extraocular movements intact bilaterally. Neck:  Without mass or deformity.  Trachea is midline. Lungs:  Clear to auscultate bilaterally without rhonchi, wheeze,  or rales. Heart:  Regular rate and rhythm.  Without murmurs, rubs, or gallops. Abdomen:  Soft, nontender, nondistended.  Without guarding or rebound. Extremities: Without cyanosis, clubbing, edema, or obvious deformity. Vascular:  Dorsalis pedis and posterior tibial pulses palpable bilaterally. Skin:  Warm and dry, no erythema, no ulcerations.   Data Reviewed: I have personally reviewed following labs and imaging studies  CBC: Recent Labs  Lab 03/11/19 2040  03/13/19 0213 03/14/19 0239 03/15/19 0113 03/16/19 1028 03/17/19 0357  WBC 12.4*   < > 11.1* 11.9* 13.5* 18.1* 17.8*  NEUTROABS 9.9*  --   --   --   --   --   --   HGB 8.2*   < > 7.9* 8.2* 8.0* 7.9* 8.2*  HCT 25.9*   < > 23.9* 25.2* 24.4* 24.4* 25.3*  MCV 83.3   < > 80.7 80.8 80.3 81.9 82.4  PLT 248   < > 258 272 276 287 303   < > = values in this interval not displayed.   Basic Metabolic Panel: Recent Labs  Lab 03/13/19 0213 03/14/19 0239 03/15/19 0113 03/16/19 1028 03/17/19 0357  NA 133* 134* 134* 136 137  K 4.8 5.1 5.3* 5.9* 6.2*  CL 106 107 107 107 111  CO2 18* 17* 17* 16* 16*  GLUCOSE  80 90 79 80 73  BUN 32* 34* 35* 44* 50*  CREATININE 1.67* 2.16* 1.92* 2.36* 2.67*  CALCIUM 7.5* 7.4* 7.1* 6.8* 7.1*   GFR: Estimated Creatinine Clearance: 19 mL/min (A) (by C-G formula based on SCr of 2.67 mg/dL (H)).   Liver Function Tests: Recent Labs  Lab 03/13/19 0213 03/14/19 0239 03/15/19 0113 03/16/19 1028 03/17/19 0357  AST 98* 91* 108* 146* 187*  ALT 36 32 31 33 37  ALKPHOS 579* 528* 543* 574* 626*  BILITOT 8.2* 8.7* 9.6* 11.5* 12.3*  PROT 5.9* 5.6* 5.8* 5.7* 6.4*  ALBUMIN 2.0* 1.8* 1.8* 1.8* 1.9*   Recent Labs  Lab 03/11/19 2040  LIPASE 77*   Recent Labs  Lab 03/11/19 2208  AMMONIA 65*   Coagulation Profile: Recent Labs  Lab 03/11/19 2208  INR 1.5*   Sepsis Labs: Recent Labs  Lab 03/12/19 0220 03/12/19 0546  LATICACIDVEN 1.5 0.9    Recent Results (from the past 240 hour(s))  SARS  Coronavirus 2 [facility] order, Performed in [facility] hospital lab) Nasopharyngeal Nasopharyngeal Swab     Status: None   Collection Time: 03/11/19  8:49 PM   Specimen: Nasopharyngeal Swab  Result Value Ref Range Status   SARS Coronavirus 2 NEGATIVE NEGATIVE Final    Comment: (NOTE) If result is NEGATIVE SARS-CoV-2 target nucleic acids are NOT DETECTED. The SARS-CoV-2 RNA is generally detectable in upper and lower  respiratory specimens during the acute phase of infection. The lowest  concentration of SARS-CoV-2 viral copies this assay can detect is 250  copies / mL. A negative result does not preclude SARS-CoV-2 infection  and should not be used as the sole basis for treatment or other  patient management decisions.  A negative result may occur with  improper specimen collection / handling, submission of specimen other  than nasopharyngeal swab, presence of viral mutation(s) within the  areas targeted by this assay, and inadequate number of viral copies  (<250 copies / mL). A negative result must be combined with clinical  observations, patient history, and epidemiological information. If result is POSITIVE SARS-CoV-2 target nucleic acids are DETECTED. The SARS-CoV-2 RNA is generally detectable in upper and lower  respiratory specimens dur ing the acute phase of infection.  Positive  results are indicative of active infection with SARS-CoV-2.  Clinical  correlation with patient history and other diagnostic information is  necessary to determine patient infection status.  Positive results do  not rule out bacterial infection or co-infection with other viruses. If result is PRESUMPTIVE POSTIVE SARS-CoV-2 nucleic acids MAY BE PRESENT.   A presumptive positive result was obtained on the submitted specimen  and confirmed on repeat testing.  While 2019 novel coronavirus  (SARS-CoV-2) nucleic acids may be present in the submitted sample  additional confirmatory testing may be necessary  for epidemiological  and / or clinical management purposes  to differentiate between  SARS-CoV-2 and other Sarbecovirus currently known to infect humans.  If clinically indicated additional testing with an alternate test  methodology [phone]) is advised. The SARS-CoV-2 RNA is generally  detectable in upper and lower respiratory sp ecimens during the acute  phase of infection. The expected result is Negative. Fact Sheet for Patients:  StrictlyIdeas.no Fact Sheet for Healthcare Providers: BankingDealers.co.za This test is not yet approved or cleared by the [place] FDA and has been authorized for detection and/or diagnosis of SARS-CoV-2 by FDA under an Emergency Use Authorization (EUA).  This EUA will remain in effect (meaning this test can  be used) for the duration of the COVID-19 declaration under Section 564(b)(1) of the Act, 21 U.S.C. section 360bbb-3(b)(1), unless the authorization is terminated or revoked sooner. Performed at [facility], [place] [address] [place], [place] [address]   Blood culture (routine x 2)     Status: None   Collection Time: 03/12/19  2:19 AM   Specimen: BLOOD LEFT HAND  Result Value Ref Range Status   Specimen Description BLOOD LEFT HAND  Final   Special Requests   Final    BOTTLES DRAWN AEROBIC ONLY Blood Culture adequate volume   Culture   Final    NO GROWTH 5 DAYS Performed at [facility], [place] [address], [place], [place] [address]    Report Status 03/17/2019 FINAL  Final  Blood culture (routine x 2)     Status: None   Collection Time: 03/12/19  2:31 AM   Specimen: BLOOD RIGHT HAND  Result Value Ref Range Status   Specimen Description BLOOD RIGHT HAND  Final   Special Requests   Final    BOTTLES DRAWN AEROBIC ONLY Blood Culture adequate volume   Culture   Final    NO GROWTH 5 DAYS Performed at [facility], [place] [address] [place], [place] [address]    Report Status  03/17/2019 FINAL  Final    Radiology Studies: Mr Abdomen Mrcp Wo Contrast  Result Date: 03/15/2019 CLINICAL DATA:  Inpatient. Abnormal liver enzymes. Jaundice. Acute renal failure. Suggestion of liver masses on recent noncontrast CT. EXAM: MRI ABDOMEN WITHOUT CONTRAST  (INCLUDING MRCP) TECHNIQUE: Multiplanar multisequence MR imaging of the abdomen was performed. Heavily T2-weighted images of the biliary and pancreatic ducts were obtained, and three-dimensional MRCP images were rendered by post processing. COMPARISON:  03/11/2019 CT abdomen/pelvis. FINDINGS: Lower chest: Small dependent right pleural effusion. Hepatobiliary: Hepatomegaly. There are innumerable (> than 20) bulky confluent similar mildly T2 hyperintense liver masses replacing much of the liver parenchyma. Representative 7.4 x 4.5 cm inferior right liver mass (series 5/image 29), 4.3 x 4.0 cm segment 7 right liver mass (series 5/image 10) and 5.7 x 3.8 cm central left liver mass (series 5/image 23). There is background iron deposition throughout the liver parenchyma. Distended gallbladder is completely filled with gallstones measuring up to 1.2 cm in size. Mild diffuse gallbladder wall thickening. No significant pericholecystic fluid. There is scattered mild intrahepatic biliary ductal dilatation throughout the liver. Common bile duct diameter 7 mm, mildly dilated proximal. No evidence of choledocholithiasis. Pancreas: No pancreatic mass or duct dilation.  No pancreas divisum. 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 No splenic mass. Normal size spleen. Adrenals/Urinary Tract: Normal adrenals. No hydronephrosis. Simple appearing bilateral renal cysts, largest 4.5 cm in the posterior interpolar right kidney, incompletely characterized on this noncontrast MRI. No overtly suspicious renal masses. Abnormal T2 hyperintensity throughout the renal cortex bilaterally with abnormal accentuation of the corticomedullary signal differentiation. Stomach/Bowel:  Normal non-distended stomach. Visualized small and large bowel is normal caliber, with no bowel wall thickening. Vascular/Lymphatic: Normal caliber abdominal aorta. No pathologically enlarged lymph nodes in the abdomen. Other: No abdominal ascites or focal fluid collection. Musculoskeletal: Large T2 hyperintense vertebral lesions at L1 and L3, compatible with vertebral hemangiomas in correlation with the recent CT study. IMPRESSION: 1. Innumerable bulky confluent liver masses replacing much of the liver, most compatible with metastatic disease. A primary neoplasm is not evident on this abdomen MRI study. 2. Distended gallbladder is completely filled with gallstones with nonspecific mild diffuse gallbladder wall thickening.  No pericholecystic fluid. Acute cholecystitis cannot be excluded by MRI. 3. Scattered mild intrahepatic biliary ductal dilatation throughout the liver probably due to extrinsic mass effect on the central intrahepatic bile ducts by the bulky liver masses. Mild proximal dilatation of the common bile duct (7 mm diameter). No evidence of choledocholithiasis. 4. Small dependent right pleural effusion. 5. Diffuse iron deposition in the liver and spleen, presumably transfusion related. Electronically Signed   By: Ilona Sorrel M.D.   On: 03/15/2019 17:28   Mr 3d Recon At Scanner  Result Date: 03/15/2019 CLINICAL DATA:  Inpatient. Abnormal liver enzymes. Jaundice. Acute renal failure. Suggestion of liver masses on recent noncontrast CT. EXAM: MRI ABDOMEN WITHOUT CONTRAST  (INCLUDING MRCP) TECHNIQUE: Multiplanar multisequence MR imaging of the abdomen was performed. Heavily T2-weighted images of the biliary and pancreatic ducts were obtained, and three-dimensional MRCP images were rendered by post processing. COMPARISON:  03/11/2019 CT abdomen/pelvis. FINDINGS: Lower chest: Small dependent right pleural effusion. Hepatobiliary: Hepatomegaly. There are innumerable (> than 20) bulky confluent similar  mildly T2 hyperintense liver masses replacing much of the liver parenchyma. Representative 7.4 x 4.5 cm inferior right liver mass (series 5/image 29), 4.3 x 4.0 cm segment 7 right liver mass (series 5/image 10) and 5.7 x 3.8 cm central left liver mass (series 5/image 23). There is background iron deposition throughout the liver parenchyma. Distended gallbladder is completely filled with gallstones measuring up to 1.2 cm in size. Mild diffuse gallbladder wall thickening. No significant pericholecystic fluid. There is scattered mild intrahepatic biliary ductal dilatation throughout the liver. Common bile duct diameter 7 mm, mildly dilated proximal. No evidence of choledocholithiasis. Pancreas: No pancreatic mass or duct dilation.  No pancreas divisum. 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 No splenic mass. Normal size spleen. Adrenals/Urinary Tract: Normal adrenals. No hydronephrosis. Simple appearing bilateral renal cysts, largest 4.5 cm in the posterior interpolar right kidney, incompletely characterized on this noncontrast MRI. No overtly suspicious renal masses. Abnormal T2 hyperintensity throughout the renal cortex bilaterally with abnormal accentuation of the corticomedullary signal differentiation. Stomach/Bowel: Normal non-distended stomach. Visualized small and large bowel is normal caliber, with no bowel wall thickening. Vascular/Lymphatic: Normal caliber abdominal aorta. No pathologically enlarged lymph nodes in the abdomen. Other: No abdominal ascites or focal fluid collection. Musculoskeletal: Large T2 hyperintense vertebral lesions at L1 and L3, compatible with vertebral hemangiomas in correlation with the recent CT study. IMPRESSION: 1. Innumerable bulky confluent liver masses replacing much of the liver, most compatible with metastatic disease. A primary neoplasm is not evident on this abdomen MRI study. 2. Distended gallbladder is completely filled with gallstones with nonspecific mild diffuse  gallbladder wall thickening. No pericholecystic fluid. Acute cholecystitis cannot be excluded by MRI. 3. Scattered mild intrahepatic biliary ductal dilatation throughout the liver probably due to extrinsic mass effect on the central intrahepatic bile ducts by the bulky liver masses. Mild proximal dilatation of the common bile duct (7 mm diameter). No evidence of choledocholithiasis. 4. Small dependent right pleural effusion. 5. Diffuse iron deposition in the liver and spleen, presumably transfusion related. Electronically Signed   By: Ilona Sorrel M.D.   On: 03/15/2019 17:28     Scheduled Meds:  amLODipine  5 mg Oral Daily   lactulose  10 g Oral BID   levothyroxine  100 mcg Oral QAC breakfast   pantoprazole  40 mg Oral Q1200     LOS: 5 days   Time spent: 30 min  Little Ishikawa, DO [facility]  If 7PM-7AM, please contact night-coverage  [email or web address] Password [email or web address] 03/17/2019, 8:20 AM

## 2019-03-17 NOTE — Consult Note (Signed)
Consultation Note Date: 03/17/2019   Patient Name: Shawna Fuentes  DOB: 06-Jul-1932  MRN: [id number]  Age / Sex: 85 y.o., female  PCP: Binnie Rail, MD Referring Physician: Little Ishikawa, MD  Reason for Consultation: Establishing goals of care and Psychosocial/spiritual support  HPI/Patient Profile: 85 y.o. female  with past medical history of chronic respiratory failure on oxygen, CKD 3, DVT, hypothyroidism, and kidney stones who was admitted on 03/11/2019 with abdominal pain, decreased PO intake and leg swelling.   The patient's son passed away the previous 2022/08/30 and the patient's PO intake decreased severely the same day.  Imaging revealed acute diverticulitis and liver abnormalities.  Her LFTs were significantly elevated.  MRCP showed innumerable bulky confluent liver masses replacing most of the liver, compatible with metastatic disease.  The patient underwent upper endoscopy for suspected stricture and received balloon dilation to 18 mm.  Two sessile polyps were found in the gastric fundus and antrum and biopsied. Oncology was curb-sided by the attending physician.  Given the advanced progression the of metastatic cancer, and the patient's declining renal function, palliative care was recommended.  Clinical Assessment and Goals of Care:  I have reviewed medical records including EPIC notes, labs and imaging, received report from the RN, assessed the patient and then met at the bedside along with her children and grandson  to discuss diagnosis prognosis, GOC, EOL wishes, disposition and options.  More extended family was on the phone.  Hubert Azure (RN with [employer] for many years) is [name] grand daughter and the family has delegated her to be the patient's primary contact for medical care.  I introduced Palliative Medicine as specialized medical care for people living with serious illness. It  focuses on providing relief from the symptoms and stress of a serious illness. The goal is to improve quality of life for both the patient and the family.  We discussed a brief life review of the patient. She worked as a Chartered certified accountant for many years and devoted her life to caring for her children.  Unfortunately the family has suffered multiple losses (including her son).  The family has chosen to care for their members in their home with the help of Hospice.  As far as functional and nutritional status she is no longer eating well and simply has no appetite.  She was walking prior to admission but now is very weak and unable to get up.  We discussed her current illness and what it means in the larger context of her on-going co-morbidities.  Natural disease trajectory and expectations at EOL were discussed.  Unfortunately [name] prognosis is very short.  Given her liver failure and kidney failure she likely only has days to weeks.  This news saddened the family but they seemed to adapt and were looking forward to taking her home and loving her.  I attempted to elicit values and goals of care important to the patient.  Hospice and Palliative Care services outpatient were explained and offered.  Mrs. Kovarik would  like to go home with Hospice care and her family is support of that plan.  Questions and concerns were addressed.  The family was encouraged to call with questions or concerns.        Primary Decision Maker:  PATIENT and her grand daughter Hubert Azure, RN    SUMMARY OF RECOMMENDATIONS    1.  Very low dose oral dilaudid PRN for symptoms of pain / SOB.  Please Rx on discharge.   (Dilaudid rather than morphine due to kidney failure) 2.  Home with Hosp[facility] services thru [facility].  She will need a hospital bed in her home. 3.  Family requests that she be discharged with a foley catheter in place 4.  Code status changed to DNR.  Code Status/Advance Care Planning:  DNR   Symptom  Management:   Foley, low dose dilaudid PRN shortness of breath or discomfort.  (Family would like for patient to remain as alert as possible)  Additional Recommendations (Limitations, Scope, Preferences):  Full Comfort Care   Psycho-social/Spiritual:   Desire for further Chaplaincy support:  no  Prognosis:  Days to weeks.  Given liver failure and kidney failure.  No longer eating    Discharge Planning: Home with Hospice      Primary Diagnoses: Present on Admission: . Acute diverticulitis . Hyperammonemia ([place]) . Chronic respiratory failure with hypercapnia ([place]) . Cirrhosis of liver ([place]) . Cholelithiasis . Hypertension . Anemia . Obstructive sleep apnea . Acute renal failure superimposed on stage 3 chronic kidney disease ([place]) . Edema, bilateral legs   I have reviewed the medical record, interviewed the patient and family, and examined the patient. The following aspects are pertinent.  Past Medical History:  Diagnosis Date  . Arthritis   . DVT (deep venous thrombosis) ([place]) 12/2013   bilateral upper extremity DVT found at time of thyroidectomy   . History of kidney stones   . Hyperlipidemia   . Hypertension   . Hypothyroidism (acquired) 12/2013   s/p [facility] 12/21/[facility]  . Osteoporosis 07/15/2014   DEXA @ [facility] 07/14/14: -3.1  . Thyroid goiter    s/p thyroidectomy - pathology demonstrated nodular hyperplasia   Social History   Socioeconomic History  . Marital status: Widowed    Spouse name: Not on file  . Number of children: 5  . Years of education: Not on file  . Highest education level: Not on file  Occupational History  . Occupation: retired  Scientific laboratory technician  . Financial resource strain: Not hard at all  . Food insecurity    Worry: Never true    Inability: Never true  . Transportation needs    Medical: No    Non-medical: No  Tobacco Use  . Smoking status: Never Smoker  . Smokeless tobacco: Never Used  . Tobacco comment: lives w/ dtr  dorthea, 2nd dtr and 3 sons in town  Substance and Sexual Activity  . Alcohol use: No    Alcohol/week: 0.0 standard drinks  . Drug use: No  . Sexual activity: Not Currently  Lifestyle  . Physical activity    Days per week: 0 days    Minutes per session: 0 min  . Stress: Not at all  Relationships  . Social connections    Talks on phone: More than three times a week    Gets together: More than three times a week    Attends religious service: More than 4 times per year    Active member of club or organization: Yes  Attends meetings of clubs or organizations: More than 4 times per year    Relationship status: Widowed  Other Topics Concern  . 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   Lives with daughter dorthea - has 3 sons and another daughter in town, supportive   Family History  Problem Relation Age of Onset  . Osteoarthritis Mother   . Hypertension Mother   . Hypertension Father   . Cancer Father        unknown type  . Diabetes Son   . Diabetes Son   . Clotting disorder Other   . Rheumatologic disease Other    Scheduled Meds: . amLODipine  5 mg Oral Daily  . lactulose  10 g Oral BID  . levothyroxine  100 mcg Oral QAC breakfast  . pantoprazole  40 mg Oral Q1200   Continuous Infusions: PRN Meds:.acetaminophen **OR** acetaminophen, ondansetron **OR** ondansetron (ZOFRAN) IV Allergies  Allergen Reactions  . Oxycodone Other (See Comments)    Unknown goofiness  . Tetanus Toxoid Adsorbed Other (See Comments)    unknown  . Tetanus Toxoids     Pt declines / states she is not sure why / discussed out of pocket and may wait until resources are saved    Physical Exam  Well developed obese elderly female, awake, alert, pleasant, coherent 2+ scleral icterus No respiratory distress Obese.  Vital Signs: BP (!) 113/42 (BP Location: Left Arm)   Pulse 87   Temp 98.7 F  (37.1 C) (Oral)   Resp 18   Ht _0  (1.6 m)   Wt 113.4 kg   SpO2 96%   BMI 44.29 kg/m  Pain Scale: 0-10   Pain Score: 2    SpO2: SpO2: 96 % O2 Device:SpO2: 96 % O2 Flow Rate: .   IO: Intake/output summary:   Intake/Output Summary (Last 24 hours) at 03/17/2019 1045 Last data filed at 03/17/2019 0901 Gross per 24 hour  Intake 600 ml  Output 400 ml  Net 200 ml    LBM: Last BM Date: 03/16/19 Baseline Weight: Weight: 113.4 kg Most recent weight: Weight: 113.4 kg     Palliative Assessment/Data: 20%     Time In: 10:00  Time Out: 11:10 Time Total: 70 min Visit consisted of counseling and education dealing with the complex and emotionally intense issues surrounding the need for palliative care and symptom management in the setting of serious and potentially life-threatening illness. Greater than 50%  of this time was spent counseling and coordinating care related to the above assessment and plan.  Signed by: Florentina Jenny, PA-C Palliative Medicine Pager: [phone]  Please contact Palliative Medicine Team phone at [phone] for questions and concerns.  For individual provider: See Shea Evans

## 2019-03-17 NOTE — Progress Notes (Signed)
Manufacturing engineer (ACC)  Patient is approved for hospice services at home once discharged.  DME ordered on 9/12:  Bed with rails, bedside table, O2, bariatric wheelchair. Contact Sheppard Evens (granddaughter [phone]) to confirm once DME is in place so transportation can be arranged.  Pt will need ambulance transport home.  Please send completed DNR and any comfort medications prescriptions that may be needed prior to hospice beginning services.  Thank you, Venia Carbon RN, BSN, [facility] Liaison (in [place]) [phone]

## 2019-03-17 NOTE — TOC Progression Note (Signed)
Transition of Care [facility]) - Progression Note    Patient Details  Name: Shawna Fuentes MRN: [id number] Date of Birth: September 17, 1932  Transition of Care [facility] CM/SW [place], LCSW Phone Number: 03/17/2019, 12:46 PM  Clinical Narrative:    CSW received consult from Palliative for home hospice. Family's preference is [facility]. CSW sent referral for review. Covering weekend CSW to follow up.     Barriers to Discharge: Continued Medical Work up, Museum/gallery curator   In-house Referral: Clinical Social Work Discharge Planning Services: NA   Living arrangements for the past 2 months: [place]                 DME Arranged: N/A DME Agency: NA       HH Arranged: NA HH Agency: NA         Social Determinants of Health ([place]) Interventions    Readmission Risk Interventions Readmission Risk Prevention Plan 03/13/2019  Transportation Screening Complete  PCP or Specialist Appt within 5-7 Days Complete  Home Care Screening Complete  Medication Review (RN CM) Complete  Some recent data might be hidden

## 2019-03-17 NOTE — Progress Notes (Addendum)
[facility])   Referral received from [employer], for hospice support at home once discharged.   Left several messages with family members listed on facesheet.  Awaiting return call to begin setting up hospice services.  Thank you, Venia Carbon RN, BSN, CCRN [facility] (in [place]) [phone]  **update, granddaughter Sheppard Evens [phone]) returned call.   Plan to d/c once DME has been ordered and delivered. Will need ambulance transport home. DME Needs:  Bed, bedside table, wheelchair, O2 continuous. Updated hospital LCSW

## 2019-03-18 ENCOUNTER — Encounter (HOSPITAL_COMMUNITY): Payer: Self-pay | Admitting: Gastroenterology

## 2019-03-18 DIAGNOSIS — Z515 Encounter for palliative care: Secondary | ICD-10-CM

## 2019-03-18 DIAGNOSIS — C787 Secondary malignant neoplasm of liver and intrahepatic bile duct: Principal | ICD-10-CM

## 2019-03-18 DIAGNOSIS — C801 Malignant (primary) neoplasm, unspecified: Secondary | ICD-10-CM

## 2019-03-18 MED ORDER — ONDANSETRON HCL 4 MG PO TABS: 4.0000 mg | ORAL_TABLET | Freq: Four times a day (QID) | ORAL | 0 refills | Status: AC | PRN

## 2019-03-18 MED ORDER — HYDROMORPHONE HCL 2 MG PO TABS: 1.0000 mg | ORAL_TABLET | ORAL | 0 refills | Status: AC | PRN

## 2019-03-18 NOTE — Progress Notes (Signed)
Patient discharged to home with instructions given to her granddaughter Sheppard Evens, transported by [employer].

## 2019-03-18 NOTE — Discharge Summary (Signed)
Physician Discharge Summary  Shawna Fuentes JJH:[id number] DOB: [birth year] DOA: 03/11/2019  PCP: Binnie Rail, MD  Admit date: 03/11/2019 Discharge date: 03/18/2019  Admitted From: Home Disposition: Home with hospice  Recommendations for Outpatient Follow-up:  1. Follow up with PCP in 1-2 weeks 2. Please obtain BMP/CBC in one week  Discharge Condition: Guarded CODE STATUS: DNR Diet recommendation: As tolerated  Brief/Interim Summary: Shawna L Crosbyis a 85 y.o.femalewith medical history significant ofDVT, HTN, OSA chronic resp failure with hypoxia, CKD stage 3. Patient presents to the ED with c/o 1 week history of SOB, generalized weakness, loss of appetite. Son passed away on 2022/09/03. Symptoms ongoing since that time. She has had increased leg swelling recently for which PCP increased dose of Lasix. Hasnt helped symptoms. She also reports occasional mid left lower abdominal pain with constipation. No vomiting, had some nausea after trying to eat, drinking ensure. In ED despite the initial suspicion that symptoms would be due to grief. She actually has multiple medical abnormalities and findings. Acute diverticulitis demonstrated on CT scan, wbc 12.4K. Anemia with HGB 8.2 down from 13.3 baseline in Dec last year. Trace positive hemoccult though no frank large GIB. Apparent new diagnosis of cirrhosis appearance of liver on imaging, some question of mass vs nodular appearance on CT scan though [place] didn't show mass. Ammonia of 65, tbili of 8, AST 106, ALT 43, lipase 77, ALK 542. Creat 2.1 up from 1.2 baseline. Sodium 130. Cholelithiasis without acute cholecystitis. Patient admitted for further GI workup given lab abnormalities and findings on imaging to rule out overt cause of anemia.  Patient admitted as above with no malaise, weakness, loss of appetite -recent death of her son prior to admission.  Initially unclear etiology, rating p.o. quite poorly.  GI was consulted, recommended other imaging  of the abdomen with MRCP and EGD.  Unfortunately MRCP cannot be done with contrast due to patient's worsening kidney disease but without contrast very large tumor burden was noted in the liver, likely to be distant metastasis from unknown primary.  Heme-onc sidelined, who recommended against any further aggressive work-up given patient's advanced disease would likely not improve or prolong her life in any meaningful way.  At this time family and patient are agreeable to hospice at home, currently awaiting medical supply delivery of hospital bed and other needs prior to disposition home with hospice. Palliative care is following along now agreeable to home with hospice, we appreciate insight recommendations, patient's PCP notified as well.  Discharge Diagnoses:  Principal Problem:   Metastatic carcinoma involving liver with unknown primary site [facility]) Active Problems:   Hypertension   Edema, bilateral legs   Chronic respiratory failure with hypercapnia (HCC)   Obstructive sleep apnea   Acute diverticulitis   Hyperammonemia (HCC)   Cirrhosis of liver (HCC)   Cholelithiasis   Anemia   Acute renal failure superimposed on stage 3 chronic kidney disease (HCC)   Elevated LFTs   Hyperbilirubinemia   Dysphagia   Hospice care patient  Discharge Instructions  Discharge Instructions    Diet - low sodium heart healthy   Complete by: As directed    Increase activity slowly   Complete by: As directed      Allergies as of 03/18/2019      Reactions   Oxycodone Other (See Comments)   Unknown goofiness   Tetanus Toxoid Adsorbed Other (See Comments)   unknown   Tetanus Toxoids    Pt declines / states she is not sure why /  discussed out of pocket and may wait until resources are saved      Medication List    TAKE these medications   amLODipine 2.5 MG tablet Commonly known as: NORVASC Take 1 tablet (2.5 mg total) by mouth daily.   aspirin 81 MG tablet Take 1 tablet (81 mg total) by mouth  daily.   atorvastatin 10 MG tablet Commonly known as: LIPITOR TAKE 1 TABLET BY MOUTH EVERY EVENING What changed: when to take this   calcitRIOL 0.25 MCG capsule Commonly known as: ROCALTROL TAKE 1 CAPSULE (0.25 MCG TOTAL) BY MOUTH 2 (TWO) TIMES DAILY.   calcium carbonate 600 MG Tabs tablet Commonly known as: OS-CAL Take 1 tablet (600 mg total) by mouth 2 (two) times daily with a meal.   famotidine 20 MG tablet Commonly known as: PEPCID TAKE 1 TABLET BY MOUTH EVERYDAY AT BEDTIME What changed: See the new instructions.   furosemide 20 MG tablet Commonly known as: LASIX Take 1 tablet (20 mg total) by mouth daily.   HYDROmorphone 2 MG tablet Commonly known as: DILAUDID Take 0.5-1 tablets (1-2 mg total) by mouth every 4 (four) hours as needed for severe pain (Going home with hospice.  use for insomnia, agitation, discomfort).   Iron 325 (65 Fe) MG Tabs Take 1 tablet by mouth 3 (three) times daily.   levothyroxine 100 MCG tablet Commonly known as: SYNTHROID Take 1 tab daily 6 days a week, 1.5 pills one day a week What changed:   how much to take  how to take this  when to take this  additional instructions   losartan 100 MG tablet Commonly known as: COZAAR TAKE 1 TABLET BY MOUTH EVERY DAY   metoprolol tartrate 25 MG tablet Commonly known as: LOPRESSOR TAKE 1/2 TABLET BY MOUTH TWICE A DAY   ondansetron 4 MG tablet Commonly known as: ZOFRAN Take 1 tablet (4 mg total) by mouth every 6 (six) hours as needed for nausea.   potassium chloride SA 20 MEQ tablet Commonly known as: K-DUR Take 1 tablet (20 mEq total) by mouth daily for 3 days.   Prolia 60 MG/ML Sosy injection Generic drug: denosumab Inject 60 mg into the skin every 6 (six) months.   Vitamin D3 125 MCG (5000 UT) Caps Take 1 capsule by mouth 2 (two) times daily.       Allergies  Allergen Reactions  . Oxycodone Other (See Comments)    Unknown goofiness  . Tetanus Toxoid Adsorbed Other (See  Comments)    unknown  . Tetanus Toxoids     Pt declines / states she is not sure why / discussed out of pocket and may wait until resources are saved    Consultations:  GI - Dr Havery Moros   Procedures/Studies: Mr Abdomen Mrcp Wo Contrast  Result Date: 03/15/2019 CLINICAL DATA:  Inpatient. Abnormal liver enzymes. Jaundice. Acute renal failure. Suggestion of liver masses on recent noncontrast CT. EXAM: MRI ABDOMEN WITHOUT CONTRAST  (INCLUDING MRCP) TECHNIQUE: Multiplanar multisequence MR imaging of the abdomen was performed. Heavily T2-weighted images of the biliary and pancreatic ducts were obtained, and three-dimensional MRCP images were rendered by post processing. COMPARISON:  03/11/2019 CT abdomen/pelvis. FINDINGS: Lower chest: Small dependent right pleural effusion. Hepatobiliary: Hepatomegaly. There are innumerable (> than 20) bulky confluent similar mildly T2 hyperintense liver masses replacing much of the liver parenchyma. Representative 7.4 x 4.5 cm inferior right liver mass (series 5/image 29), 4.3 x 4.0 cm segment 7 right liver mass (series 5/image 10) and 5.7 x  3.8 cm central left liver mass (series 5/image 23). There is background iron deposition throughout the liver parenchyma. Distended gallbladder is completely filled with gallstones measuring up to 1.2 cm in size. Mild diffuse gallbladder wall thickening. No significant pericholecystic fluid. There is scattered mild intrahepatic biliary ductal dilatation throughout the liver. Common bile duct diameter 7 mm, mildly dilated proximal. No evidence of choledocholithiasis. Pancreas: No pancreatic mass or duct dilation.  No pancreas divisum. 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 No splenic mass. Normal size spleen. Adrenals/Urinary Tract: Normal adrenals. No hydronephrosis. Simple appearing bilateral renal cysts, largest 4.5 cm in the posterior interpolar right kidney, incompletely characterized on this noncontrast MRI. No overtly suspicious  renal masses. Abnormal T2 hyperintensity throughout the renal cortex bilaterally with abnormal accentuation of the corticomedullary signal differentiation. Stomach/Bowel: Normal non-distended stomach. Visualized small and large bowel is normal caliber, with no bowel wall thickening. Vascular/Lymphatic: Normal caliber abdominal aorta. No pathologically enlarged lymph nodes in the abdomen. Other: No abdominal ascites or focal fluid collection. Musculoskeletal: Large T2 hyperintense vertebral lesions at L1 and L3, compatible with vertebral hemangiomas in correlation with the recent CT study. IMPRESSION: 1. Innumerable bulky confluent liver masses replacing much of the liver, most compatible with metastatic disease. A primary neoplasm is not evident on this abdomen MRI study. 2. Distended gallbladder is completely filled with gallstones with nonspecific mild diffuse gallbladder wall thickening. No pericholecystic fluid. Acute cholecystitis cannot be excluded by MRI. 3. Scattered mild intrahepatic biliary ductal dilatation throughout the liver probably due to extrinsic mass effect on the central intrahepatic bile ducts by the bulky liver masses. Mild proximal dilatation of the common bile duct (7 mm diameter). No evidence of choledocholithiasis. 4. Small dependent right pleural effusion. 5. Diffuse iron deposition in the liver and spleen, presumably transfusion related. Electronically Signed   By: Ilona Sorrel M.D.   On: 03/15/2019 17:28   Mr 3d Recon At Scanner  Result Date: 03/15/2019 CLINICAL DATA:  Inpatient. Abnormal liver enzymes. Jaundice. Acute renal failure. Suggestion of liver masses on recent noncontrast CT. EXAM: MRI ABDOMEN WITHOUT CONTRAST  (INCLUDING MRCP) TECHNIQUE: Multiplanar multisequence MR imaging of the abdomen was performed. Heavily T2-weighted images of the biliary and pancreatic ducts were obtained, and three-dimensional MRCP images were rendered by post processing. COMPARISON:  03/11/2019 CT  abdomen/pelvis. FINDINGS: Lower chest: Small dependent right pleural effusion. Hepatobiliary: Hepatomegaly. There are innumerable (> than 20) bulky confluent similar mildly T2 hyperintense liver masses replacing much of the liver parenchyma. Representative 7.4 x 4.5 cm inferior right liver mass (series 5/image 29), 4.3 x 4.0 cm segment 7 right liver mass (series 5/image 10) and 5.7 x 3.8 cm central left liver mass (series 5/image 23). There is background iron deposition throughout the liver parenchyma. Distended gallbladder is completely filled with gallstones measuring up to 1.2 cm in size. Mild diffuse gallbladder wall thickening. No significant pericholecystic fluid. There is scattered mild intrahepatic biliary ductal dilatation throughout the liver. Common bile duct diameter 7 mm, mildly dilated proximal. No evidence of choledocholithiasis. Pancreas: No pancreatic mass or duct dilation.  No pancreas divisum. 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 No splenic mass. Normal size spleen. Adrenals/Urinary Tract: Normal adrenals. No hydronephrosis. Simple appearing bilateral renal cysts, largest 4.5 cm in the posterior interpolar right kidney, incompletely characterized on this noncontrast MRI. No overtly suspicious renal masses. Abnormal T2 hyperintensity throughout the renal cortex bilaterally with abnormal accentuation of the corticomedullary signal differentiation. Stomach/Bowel: Normal non-distended stomach. Visualized small and large bowel is normal caliber, with no  bowel wall thickening. Vascular/Lymphatic: Normal caliber abdominal aorta. No pathologically enlarged lymph nodes in the abdomen. Other: No abdominal ascites or focal fluid collection. Musculoskeletal: Large T2 hyperintense vertebral lesions at L1 and L3, compatible with vertebral hemangiomas in correlation with the recent CT study. IMPRESSION: 1. Innumerable bulky confluent liver masses replacing much of the liver, most compatible with metastatic  disease. A primary neoplasm is not evident on this abdomen MRI study. 2. Distended gallbladder is completely filled with gallstones with nonspecific mild diffuse gallbladder wall thickening. No pericholecystic fluid. Acute cholecystitis cannot be excluded by MRI. 3. Scattered mild intrahepatic biliary ductal dilatation throughout the liver probably due to extrinsic mass effect on the central intrahepatic bile ducts by the bulky liver masses. Mild proximal dilatation of the common bile duct (7 mm diameter). No evidence of choledocholithiasis. 4. Small dependent right pleural effusion. 5. Diffuse iron deposition in the liver and spleen, presumably transfusion related. Electronically Signed   By: Ilona Sorrel M.D.   On: 03/15/2019 17:28   Dg Chest Portable 1 View  Result Date: 03/11/2019 CLINICAL DATA:  Shortness of breath beginning today, has not been eating or drinking since 08-22-22 when son passed away, nausea, weakness, history hypertension, hyperlipidemia EXAM: PORTABLE CHEST 1 VIEW COMPARISON:  Portable exam 1940 hours compared to 09/12/2014 FINDINGS: Upper normal heart size post median sternotomy. Mediastinal contours and pulmonary vascularity normal. Atherosclerotic calcification aorta. Chronic eventration of the RIGHT diaphragm. Bibasilar atelectasis. No infiltrate, pleural effusion or pneumothorax. Osseous demineralization with advanced degenerative changes of BILATERAL glenohumeral joints. IMPRESSION: Bibasilar atelectasis. Electronically Signed   By: Lavonia Dana M.D.   On: 03/11/2019 19:51   Ct Renal Stone Study  Result Date: 03/11/2019 CLINICAL DATA:  Left flank pain EXAM: CT ABDOMEN AND PELVIS WITHOUT CONTRAST TECHNIQUE: Multidetector CT imaging of the abdomen and pelvis was performed following the standard protocol without IV contrast. COMPARISON:  None. FINDINGS: Lower chest: No acute abnormality. Hepatobiliary: Nodular contours within the liver compatible with cirrhosis. There are ill-defined  low-density areas within the liver, cannot exclude focal masses. Gallbladder is distended and appears to be filled with gallstones. No biliary ductal dilatation. Pancreas: No focal abnormality or ductal dilatation. Spleen: No focal abnormality.  Normal size. Adrenals/Urinary Tract: Bilateral renal cysts. No renal or ureteral stones. No hydronephrosis. Urinary bladder and adrenal glands are unremarkable. Stomach/Bowel: Colonic diverticulosis, most pronounced in the descending colon and sigmoid colon. Inflammatory stranding around the mid descending colon compatible with active diverticulitis. Stomach and small bowel decompressed, grossly unremarkable. Appendix normal. Vascular/Lymphatic: Aortic atherosclerosis. No enlarged abdominal or pelvic lymph nodes. Reproductive: Uterus and adnexa unremarkable.  No mass. Other: No free fluid or free air. Musculoskeletal: No acute bony abnormality. IMPRESSION: Changes of cirrhosis. There are scattered ill-defined low-density areas within the liver. This could reflect geographic fatty infiltration, but focal hepatic masses/lesions cannot be excluded on this unenhanced study. This could be further evaluated with contrast-enhanced CT or MRI. Gallbladder appears to be distended and filled with gallstones. Colonic diverticulosis. Inflammatory stranding around the mid descending colon compatible with active diverticulitis. Aortic atherosclerosis. No renal or ureteral stones.  No hydronephrosis. Electronically Signed   By: Rolm Baptise M.D.   On: 03/11/2019 23:20   US Abdomen Limited Ruq  Result Date: 03/12/2019 CLINICAL DATA:  Initial evaluation for elevated LFTs, abnormal CT. EXAM: ULTRASOUND ABDOMEN LIMITED RIGHT UPPER QUADRANT COMPARISON:  Prior CT from 03/11/2019. FINDINGS: Gallbladder: Gallbladder distended with multiple internal shadowing echogenic stones, largest of which measures approximately 6 mm. A Wes  sign is present. Gallbladder wall measures at the upper limits of  normal at 3 mm in thickness. No free pericholecystic fluid. No sonographic Murphy sign elicited on exam. Common bile duct: Diameter: 6 mm Liver: Liver demonstrates a heterogeneous echotexture with nodular contour, consistent with cirrhosis. No focal intrahepatic mass identified by sonography. Portal vein is patent on color Doppler imaging with normal direction of blood flow towards the liver. Other: Incidental note made of 2 small simple cysts within the right kidney, also seen on prior CT. IMPRESSION: 1. Distended gallbladder filled with stones. No other sonographic features to suggest acute cholecystitis. 2. No biliary dilatation. 3. Morphologic changes consistent with cirrhosis. No discrete intrahepatic mass evident by sonography. Electronically Signed   By: Jeannine Boga M.D.   On: 03/12/2019 00:25   Dg Esophagus W Single Cm (sol Or Thin Ba)  Result Date: 03/14/2019 CLINICAL DATA:  Difficulty swallowing.  Previous thyroidectomy. EXAM: ESOPHOGRAM/BARIUM SWALLOW TECHNIQUE: Single contrast examination was performed using  thin barium. FLUOROSCOPY TIME:  Fluoroscopy Time: 1 minutes and 0 seconds of low-dose pulsed fluoroscopy. Radiation Exposure Index (if provided by the fluoroscopic device): 9.8 mGy Number of Acquired Spot Images: 0 COMPARISON:  Chest CT 12/20/2013.  Abdominal CT 03/11/2019. FINDINGS: The patient has limited mobility. The examination was performed in the supine, semi erect and RPO positions. The patient swallowed the barium without difficulty. No laryngeal penetration identified. There is narrowing of the cervical esophagus, likely due to a prominent cricopharyngeus impression on the esophagus. No mucosal irregularity is seen in this area. There is a decreased primary stripping wave and mild presbyesophagus. There is a small hiatal hernia with a non restrictive distal esophageal ring. Possible mild smooth narrowing at the gastroesophageal junction, having a maximal diameter of 9 mm. In  the semi erect position, a barium tablet was administered, and this passed rapidly into the distal esophagus. Despite drinking water and barium, no passage of this tablet into the stomach was observed during 3 minutes of intermittent fluoroscopic observation. The tablet passed freely through the cervical esophagus. IMPRESSION: 1. Possible mild stricture at the gastroesophageal junction, preventing the passage of a 13 mm barium tablet. The tablet was administered with the patient in the semi erect position which in itself may limit passage. 2. Mild esophageal dysmotility with presbyesophagus. No focal mucosal ulceration. 3. Prominent cricopharyngeus impression on the cervical esophagus, not delaying the passage of the tablet. 4. In review of recent noncontrast abdominal CT, multiple hypodense masses are identified within the liver, suspicious for neoplasm. Further evaluation with enhanced abdominal CT or MRI recommended. Electronically Signed   By: Richardean Sale M.D.   On: 03/14/2019 13:39    Subjective: No acute issues or events overnight, tolerating p.o. somewhat better than previously but still markedly limited.  Denies nausea, vomiting, diarrhea, constipation, headache, fevers, chills.   Discharge Exam: Vitals:   03/17/19 2001 03/18/19 0406  BP: (!) 110/52 (!) 101/51  Pulse: (!) 105 (!) 103  Resp: 20 20  Temp: 97.9 F (36.6 C) 97.8 F (36.6 C)  SpO2: 95% 95%   Vitals:   03/17/19 0411 03/17/19 1240 03/17/19 2001 03/18/19 0406  BP: (!) 113/42 (!) 112/50 (!) 110/52 (!) 101/51  Pulse: 87 86 (!) 105 (!) 103  Resp: _0 Temp: 98.7 F (37.1 C) 98.4 F (36.9 C) 97.9 F (36.6 C) 97.8 F (36.6 C)  TempSrc: Oral Oral Oral Oral  SpO2: 96% 96% 95% 95%  Weight:      Height:  General:  Pleasantly resting in bed, No acute distress. HEENT:  Normocephalic atraumatic.  Sclerae nonicteric, noninjected.  Extraocular movements intact bilaterally. Neck:  Without mass or deformity.   Trachea is midline. Lungs:  Clear to auscultate bilaterally without rhonchi, wheeze, or rales. Heart:  Regular rate and rhythm.  Without murmurs, rubs, or gallops. Abdomen:  Soft, nontender, nondistended.  Without guarding or rebound. Extremities: Without cyanosis, clubbing, edema, or obvious deformity. Vascular:  Dorsalis pedis and posterior tibial pulses palpable bilaterally. Skin:  Warm and dry, no erythema, no ulcerations.   The results of significant diagnostics from this hospitalization (including imaging, microbiology, ancillary and laboratory) are listed below for reference.     Microbiology: Recent Results (from the past 240 hour(s))  SARS Coronavirus 2 [facility] order, Performed in [facility] hospital lab) Nasopharyngeal Nasopharyngeal Swab     Status: None   Collection Time: 03/11/19  8:49 PM   Specimen: Nasopharyngeal Swab  Result Value Ref Range Status   SARS Coronavirus 2 NEGATIVE NEGATIVE Final    Comment: (NOTE) If result is NEGATIVE SARS-CoV-2 target nucleic acids are NOT DETECTED. The SARS-CoV-2 RNA is generally detectable in upper and lower  respiratory specimens during the acute phase of infection. The lowest  concentration of SARS-CoV-2 viral copies this assay can detect is 250  copies / mL. A negative result does not preclude SARS-CoV-2 infection  and should not be used as the sole basis for treatment or other  patient management decisions.  A negative result may occur with  improper specimen collection / handling, submission of specimen other  than nasopharyngeal swab, presence of viral mutation(s) within the  areas targeted by this assay, and inadequate number of viral copies  (<250 copies / mL). A negative result must be combined with clinical  observations, patient history, and epidemiological information. If result is POSITIVE SARS-CoV-2 target nucleic acids are DETECTED. The SARS-CoV-2 RNA is generally detectable in upper and lower  respiratory  specimens dur ing the acute phase of infection.  Positive  results are indicative of active infection with SARS-CoV-2.  Clinical  correlation with patient history and other diagnostic information is  necessary to determine patient infection status.  Positive results do  not rule out bacterial infection or co-infection with other viruses. If result is PRESUMPTIVE POSTIVE SARS-CoV-2 nucleic acids MAY BE PRESENT.   A presumptive positive result was obtained on the submitted specimen  and confirmed on repeat testing.  While 2019 novel coronavirus  (SARS-CoV-2) nucleic acids may be present in the submitted sample  additional confirmatory testing may be necessary for epidemiological  and / or clinical management purposes  to differentiate between  SARS-CoV-2 and other Sarbecovirus currently known to infect humans.  If clinically indicated additional testing with an alternate test  methodology [phone]) is advised. The SARS-CoV-2 RNA is generally  detectable in upper and lower respiratory sp ecimens during the acute  phase of infection. The expected result is Negative. Fact Sheet for Patients:  StrictlyIdeas.no Fact Sheet for Healthcare Providers: BankingDealers.co.za This test is not yet approved or cleared by the [place] FDA and has been authorized for detection and/or diagnosis of SARS-CoV-2 by FDA under an Emergency Use Authorization (EUA).  This EUA will remain in effect (meaning this test can be used) for the duration of the COVID-19 declaration under Section 564(b)(1) of the Act, 21 U.S.C. section 360bbb-3(b)(1), unless the authorization is terminated or revoked sooner. Performed at [facility], [place] [address], [place], [place] [address]  Blood culture (routine x 2)     Status: None   Collection Time: 03/12/19  2:19 AM   Specimen: BLOOD LEFT HAND  Result Value Ref Range Status   Specimen Description BLOOD LEFT HAND   Final   Special Requests   Final    BOTTLES DRAWN AEROBIC ONLY Blood Culture adequate volume   Culture   Final    NO GROWTH 5 DAYS Performed at [facility], [address] [place], [place] [address]    Report Status 03/17/2019 FINAL  Final  Blood culture (routine x 2)     Status: None   Collection Time: 03/12/19  2:31 AM   Specimen: BLOOD RIGHT HAND  Result Value Ref Range Status   Specimen Description BLOOD RIGHT HAND  Final   Special Requests   Final    BOTTLES DRAWN AEROBIC ONLY Blood Culture adequate volume   Culture   Final    NO GROWTH 5 DAYS Performed at [facility], [place] [address] [place], [place] [address]    Report Status 03/17/2019 FINAL  Final     Labs: BNP (last 3 results) Recent Labs    03/11/19 2040  BNP 93.2   Basic Metabolic Panel: Recent Labs  Lab 03/13/19 0213 03/14/19 0239 03/15/19 0113 03/16/19 1028 03/17/19 0357  NA 133* 134* 134* 136 137  K 4.8 5.1 5.3* 5.9* 6.2*  CL 106 107 107 107 111  CO2 18* 17* 17* 16* 16*  GLUCOSE 80 90 79 80 73  BUN 32* 34* 35* 44* 50*  CREATININE 1.67* 2.16* 1.92* 2.36* 2.67*  CALCIUM 7.5* 7.4* 7.1* 6.8* 7.1*   Liver Function Tests: Recent Labs  Lab 03/13/19 0213 03/14/19 0239 03/15/19 0113 03/16/19 1028 03/17/19 0357  AST 98* 91* 108* 146* 187*  ALT 36 32 31 33 37  ALKPHOS 579* 528* 543* 574* 626*  BILITOT 8.2* 8.7* 9.6* 11.5* 12.3*  PROT 5.9* 5.6* 5.8* 5.7* 6.4*  ALBUMIN 2.0* 1.8* 1.8* 1.8* 1.9*   Recent Labs  Lab 03/11/19 2040  LIPASE 77*   Recent Labs  Lab 03/11/19 2208  AMMONIA 65*   CBC: Recent Labs  Lab 03/11/19 2040  03/13/19 0213 03/14/19 0239 03/15/19 0113 03/16/19 1028 03/17/19 0357  WBC 12.4*   < > 11.1* 11.9* 13.5* 18.1* 17.8*  NEUTROABS 9.9*  --   --   --   --   --   --   HGB 8.2*   < > 7.9* 8.2* 8.0* 7.9* 8.2*  HCT 25.9*   < > 23.9* 25.2* 24.4* 24.4* 25.3*  MCV 83.3   < > 80.7 80.8 80.3 81.9 82.4  PLT 248   < > 258 272 276 287 303   < > = values in this  interval not displayed.   Urinalysis    Component Value Date/Time   COLORURINE YELLOW 12/20/2013 2119   APPEARANCEUR CLOUDY (A) 12/20/2013 2119   LABSPEC >1.046 (H) 12/20/2013 2119   PHURINE 5.5 12/20/2013 2119   GLUCOSEU NEGATIVE 12/20/2013 2119   HGBUR TRACE (A) 12/20/2013 2119   BILIRUBINUR NEGATIVE 12/20/2013 2119   [place] NEGATIVE 12/20/2013 2119   PROTEINUR NEGATIVE 12/20/2013 2119   UROBILINOGEN 1.0 12/20/2013 2119   NITRITE NEGATIVE 12/20/2013 2119   LEUKOCYTESUR MODERATE (A) 12/20/2013 2119   Microbiology Recent Results (from the past 240 hour(s))  SARS Coronavirus 2 [facility] order, Performed in [facility] hospital lab) Nasopharyngeal Nasopharyngeal Swab     Status: None   Collection Time: 03/11/19  8:49 PM   Specimen: Nasopharyngeal Swab  Result Value Ref Range Status   SARS Coronavirus 2 NEGATIVE NEGATIVE Final    Comment: (NOTE) If result is NEGATIVE SARS-CoV-2 target nucleic acids are NOT DETECTED. The SARS-CoV-2 RNA is generally detectable in upper and lower  respiratory specimens during the acute phase of infection. The lowest  concentration of SARS-CoV-2 viral copies this assay can detect is 250  copies / mL. A negative result does not preclude SARS-CoV-2 infection  and should not be used as the sole basis for treatment or other  patient management decisions.  A negative result may occur with  improper specimen collection / handling, submission of specimen other  than nasopharyngeal swab, presence of viral mutation(s) within the  areas targeted by this assay, and inadequate number of viral copies  (<250 copies / mL). A negative result must be combined with clinical  observations, patient history, and epidemiological information. If result is POSITIVE SARS-CoV-2 target nucleic acids are DETECTED. The SARS-CoV-2 RNA is generally detectable in upper and lower  respiratory specimens dur ing the acute phase of infection.  Positive  results are indicative of  active infection with SARS-CoV-2.  Clinical  correlation with patient history and other diagnostic information is  necessary to determine patient infection status.  Positive results do  not rule out bacterial infection or co-infection with other viruses. If result is PRESUMPTIVE POSTIVE SARS-CoV-2 nucleic acids MAY BE PRESENT.   A presumptive positive result was obtained on the submitted specimen  and confirmed on repeat testing.  While 2019 novel coronavirus  (SARS-CoV-2) nucleic acids may be present in the submitted sample  additional confirmatory testing may be necessary for epidemiological  and / or clinical management purposes  to differentiate between  SARS-CoV-2 and other Sarbecovirus currently known to infect humans.  If clinically indicated additional testing with an alternate test  methodology [phone]) is advised. The SARS-CoV-2 RNA is generally  detectable in upper and lower respiratory sp ecimens during the acute  phase of infection. The expected result is Negative. Fact Sheet for Patients:  StrictlyIdeas.no Fact Sheet for Healthcare Providers: BankingDealers.co.za This test is not yet approved or cleared by the [place] FDA and has been authorized for detection and/or diagnosis of SARS-CoV-2 by FDA under an Emergency Use Authorization (EUA).  This EUA will remain in effect (meaning this test can be used) for the duration of the COVID-19 declaration under Section 564(b)(1) of the Act, 21 U.S.C. section 360bbb-3(b)(1), unless the authorization is terminated or revoked sooner. Performed at [facility], [place] [address] [place], [place] [address]   Blood culture (routine x 2)     Status: None   Collection Time: 03/12/19  2:19 AM   Specimen: BLOOD LEFT HAND  Result Value Ref Range Status   Specimen Description BLOOD LEFT HAND  Final   Special Requests   Final    BOTTLES DRAWN AEROBIC ONLY Blood Culture adequate  volume   Culture   Final    NO GROWTH 5 DAYS Performed at [facility], [place] [address] [place],  [address]    Report Status 03/17/2019 FINAL  Final  Blood culture (routine x 2)     Status: None   Collection Time: 03/12/19  2:31 AM   Specimen: BLOOD RIGHT HAND  Result Value Ref Range Status   Specimen Description BLOOD RIGHT HAND  Final   Special Requests   Final    BOTTLES DRAWN AEROBIC ONLY Blood Culture adequate volume  Culture   Final    NO GROWTH 5 DAYS Performed at [facility], [place] [address] [place], [place] [address]    Report Status 03/17/2019 FINAL  Final     Time coordinating discharge: Over 30 minutes  SIGNED:   Little Ishikawa, DO [facility] 03/18/2019, 11:34 AM Pager   If 7PM-7AM, please contact night-coverage [email or web address] Password [email or web address]

## 2019-03-18 NOTE — TOC Transition Note (Signed)
Transition of Care [facility]) - CM/SW Discharge Note   Patient Details  Name: ANGIE SHOAF MRN: [id number] Date of Birth: 1933/01/25  [facility]) CM/SW Contact:  Weston Anna, LCSW Phone Number: 03/18/2019, 12:02 PM   Clinical Narrative:     Patient is set to discharge home today- hospice services have been arranged at this time and all DME equipment have been delivered. Patient will be going home via PTAR. Please contact patients granddaughter, Sheppard Evens at [phone], to notify her once transportation arrives and to go over any medication needs.   [place] notified of discharge and transportation being arranged. [facility]- MD notified.       Patient Goals and CMS Choice Patient states their goals for this hospitalization and ongoing recovery are:: Return home   Choice offered to / list presented to : Patient, Adult Children(Son Izell Cole)  Discharge Placement                       Discharge Plan and Services In-house Referral: Clinical Social Work Discharge Planning Services: NA            DME Arranged: N/A DME Agency: NA       HH Arranged: NA HH Agency: NA        Social Determinants of Health (SDOH) Interventions     Readmission Risk Interventions Readmission Risk Prevention Plan 03/13/2019  Transportation Screening Complete  PCP or Specialist Appt within 5-7 Days Complete  Home Care Screening Complete  Medication Review (RN CM) Complete  Some recent data might be hidden

## 2019-03-18 NOTE — Progress Notes (Signed)
Manufacturing engineer (ACC)   Spoke with granddaughter Sheppard Evens, DME to be delivered before noon.  Sheppard Evens will call unit once all set up so DME can be arranged.  Please call oral dilaudid (per PMT note recommendations) [facility] [address] prior to pt discharging so there is no lapse in her symptom management before hospice can begin services (on Monday).  Thank you, Venia Carbon RN, BSN, [facility] Liaison (in [place]) [phone]

## 2019-03-19 ENCOUNTER — Telehealth: Payer: Self-pay | Admitting: *Deleted

## 2019-03-19 ENCOUNTER — Telehealth: Payer: Self-pay | Admitting: Internal Medicine

## 2019-03-19 NOTE — Anesthesia Postprocedure Evaluation (Signed)
Anesthesia Post Note  Patient: Shawna Fuentes  Procedure(s) Performed: ESOPHAGOGASTRODUODENOSCOPY (EGD) WITH PROPOFOL (N/A ) BIOPSY BALLOON DILATION (N/A )     Anesthesia Post Evaluation  Last Vitals:  Vitals:   03/17/19 2001 03/18/19 0406  BP: (!) 110/52 (!) 101/51  Pulse: (!) 105 (!) 103  Resp: 20 20  Temp: 36.6 C 36.6 C  SpO2: 95% 95%    Last Pain:  Vitals:   03/18/19 0406  TempSrc: Oral  PainSc:                  Pervis Hocking

## 2019-03-19 NOTE — Telephone Encounter (Signed)
LVM for Trina to call back in regards.

## 2019-03-19 NOTE — Telephone Encounter (Signed)
Shawna Fuentes (PT) from [facility] home health called stating he would like to [facility] PT for patient till after hospice visit today 9/14.  Shawna Fuentes states that they would not need to come if patient goes into hospices.   Shawna Fuentes call back  [phone]

## 2019-03-19 NOTE — Telephone Encounter (Signed)
ok 

## 2019-03-19 NOTE — Anesthesia Postprocedure Evaluation (Signed)
Anesthesia Post Note  Patient: Shawna Fuentes  Procedure(s) Performed: ESOPHAGOGASTRODUODENOSCOPY (EGD) WITH PROPOFOL (N/A ) BIOPSY BALLOON DILATION (N/A )     Patient location during evaluation: PACU Anesthesia Type: MAC Level of consciousness: awake and alert Pain management: pain level controlled Vital Signs Assessment: post-procedure vital signs reviewed and stable Respiratory status: spontaneous breathing, nonlabored ventilation and respiratory function stable Cardiovascular status: blood pressure returned to baseline and stable Postop Assessment: no apparent nausea or vomiting Anesthetic complications: no    Last Vitals:  Vitals:   03/17/19 2001 03/18/19 0406  BP: (!) 110/52 (!) 101/51  Pulse: (!) 105 (!) 103  Resp: 20 20  Temp: 36.6 C 36.6 C  SpO2: 95% 95%    Last Pain:  Vitals:   03/18/19 0406  TempSrc: Oral  PainSc:                  Pervis Hocking

## 2019-03-19 NOTE — Addendum Note (Signed)
Addendum  created 03/19/19 1849 by Pervis Hocking, DO   Attestation recorded in Intraprocedure, Clinical Note Signed, [place] filed

## 2019-03-19 NOTE — Telephone Encounter (Signed)
Gave ok for orders.  

## 2019-03-19 NOTE — Telephone Encounter (Signed)
Pt was on TCM report admitted 03/11/19 with no malaise, weakness, loss of appetite-recent death of her son prior to admission. MRCP  without contrast very large tumor burden was noted in the liver, likely to be distant metastasis from unknown primary. Heme-onc sidelined, who recommended against any further aggressive work-up given patient's advanced disease would likely not improve or prolong her life in any meaningful way. At this time family and patient are agreeable to hospice at home. Pt D/C 03/18/19 to home hospice.Marland KitchenJohny Chess

## 2019-03-19 NOTE — Telephone Encounter (Signed)
Spoke with [facility] [employer] on 9/12 at 2:38pm.  States she needs to speak with MD about patient being sent to Hospice.

## 2019-03-20 ENCOUNTER — Telehealth: Payer: Self-pay

## 2019-03-20 ENCOUNTER — Inpatient Hospital Stay: Payer: Medicare HMO | Admitting: Internal Medicine

## 2019-03-21 ENCOUNTER — Telehealth: Payer: Self-pay | Admitting: Internal Medicine

## 2019-03-21 NOTE — Telephone Encounter (Signed)
Trina with Hospice would like a call back to confirm Dr Quay Burow will be the attending physician for the pt. Please call to confirm.

## 2019-03-22 ENCOUNTER — Telehealth: Payer: Self-pay

## 2019-03-22 NOTE — Telephone Encounter (Signed)
Spoke with [place] and info given.

## 2019-03-22 NOTE — Telephone Encounter (Signed)
Received dc from [facility]. DC is for burial and a patient of Billey Gosling.  DC will be taken to Primary care @ [facility] for signature.  On 03/23/2019 Received dc back from Doctor Billey Gosling.  I called the funeral home to let them know the dc is ready for pickup.

## 2019-03-22 NOTE — Telephone Encounter (Signed)
Dr Holli Humbles said he was going to be the attending, but I can do it - I am more than happy to be the attending

## 2019-03-23 DIAGNOSIS — Z515 Encounter for palliative care: Secondary | ICD-10-CM

## 2019-03-23 DIAGNOSIS — N179 Acute kidney failure, unspecified: Secondary | ICD-10-CM

## 2019-03-24 DIAGNOSIS — J9610 Chronic respiratory failure, unspecified whether with hypoxia or hypercapnia: Secondary | ICD-10-CM | POA: Diagnosis not present

## 2019-03-29 ENCOUNTER — Telehealth: Payer: Self-pay | Admitting: *Deleted

## 2019-03-29 NOTE — Telephone Encounter (Signed)
Caller is requesitng letter to correct patient's death certificate. [address] was checked that patient was pregnant at time of death. She feels sure this was just a error.  Letter needs to state "please correct [address] as the decedent was not pregnant at time of death." with today's date and MD's signature. No stamps allowed.   Letter printed, signed and faxed to [phone] Attn: Darrold Junker

## 2019-04-05 NOTE — Telephone Encounter (Signed)
Copied from [place] [phone]. Topic: General - Other >> April 11, 2019 12:19 PM Leward Quan A wrote: Reason for CRM: FYI per [facility] representative she called to check on patient and was told by a family member that the patient passed away this a.m. she was not able to confirm but wanted to pass on the information.

## 2019-04-05 NOTE — Telephone Encounter (Signed)
noted 

## 2019-04-05 DEATH — deceased

## 2019-07-20 NOTE — Telephone Encounter (Signed)
error 

## 2019-08-09 ENCOUNTER — Other Ambulatory Visit: Payer: Self-pay | Admitting: Internal Medicine

## 2020-04-15 IMAGING — DX DG CHEST 1V PORT
1 series · 1 of 1 positions shown · non-contrast
Comparison: Portable exam 3147 hours compared to 09/12/2014

CLINICAL DATA: Shortness of breath beginning today, has not been
eating or drinking since [REDACTED] when son passed away, nausea,
weakness, history hypertension, hyperlipidemia

EXAM:
PORTABLE CHEST 1 VIEW

[chest]
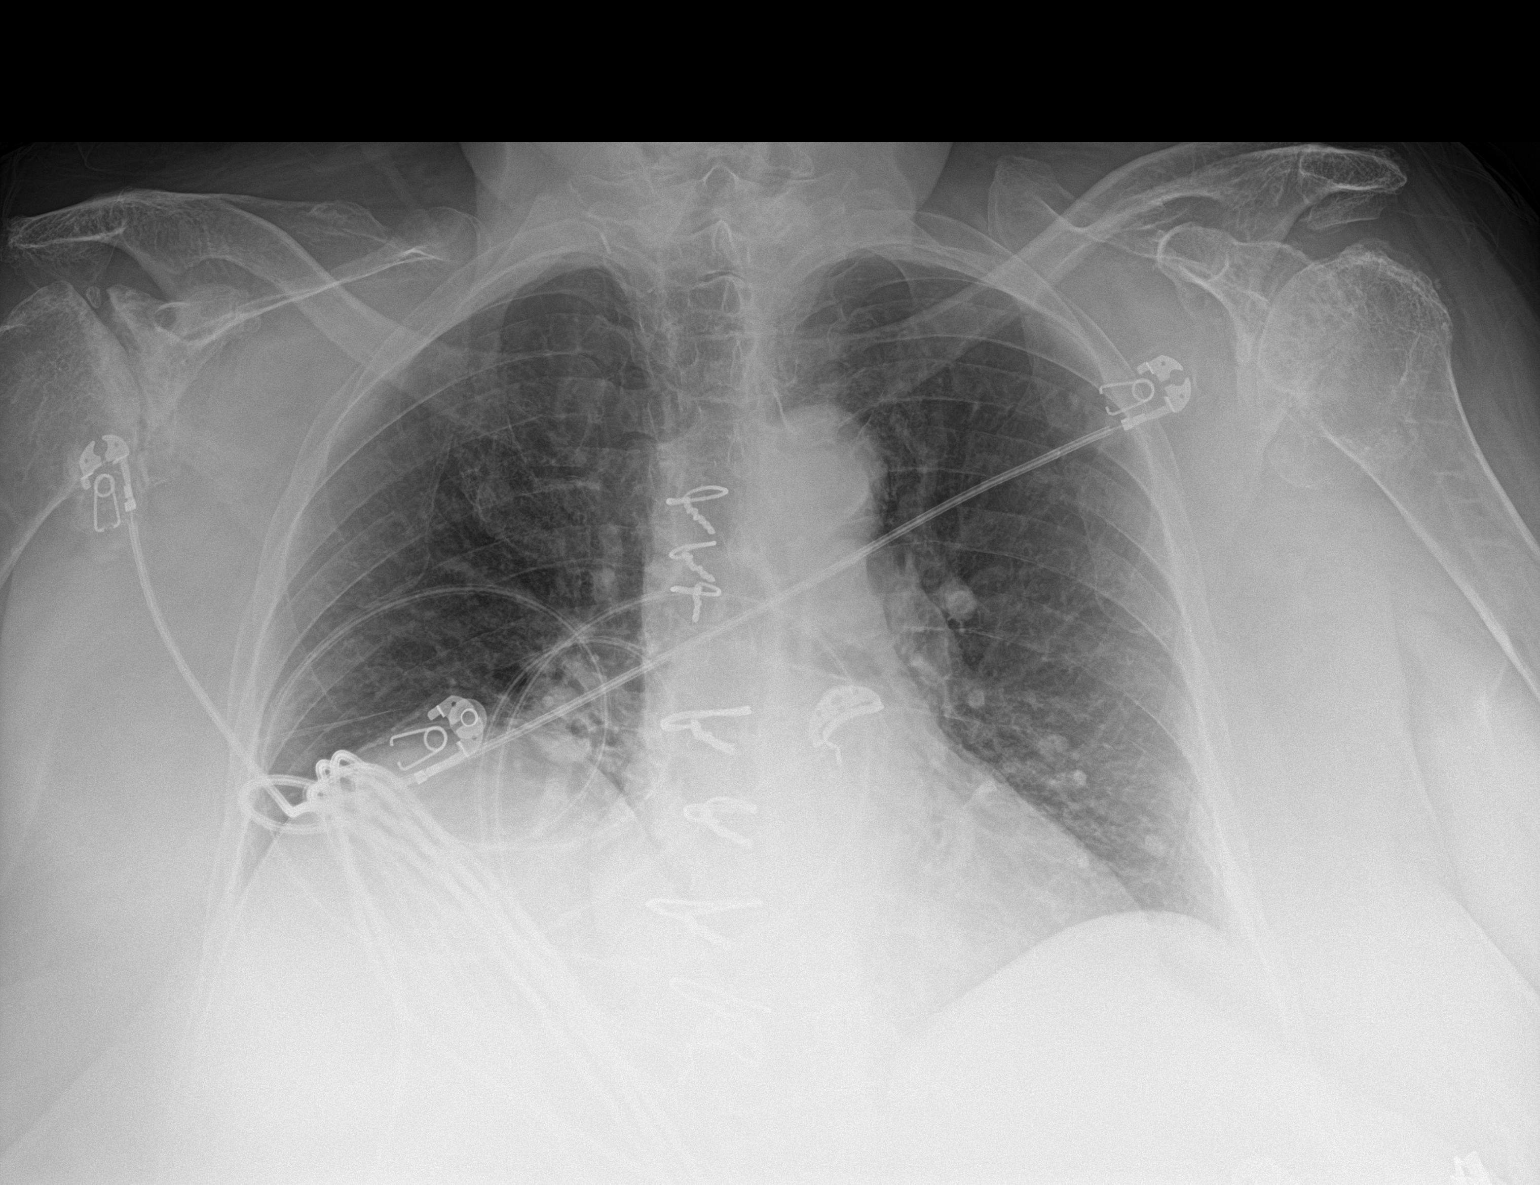

[1 of 1 positions shown; findings below may reference images not displayed]

FINDINGS: Upper normal heart size post median sternotomy.

Mediastinal contours and pulmonary vascularity normal.

Atherosclerotic calcification aorta.

Chronic eventration of the RIGHT diaphragm.

Bibasilar atelectasis.

No infiltrate, pleural effusion or pneumothorax.

Osseous demineralization with advanced degenerative changes of
BILATERAL glenohumeral joints.
IMPRESSION: Bibasilar atelectasis.

## 2020-04-16 IMAGING — US US ABDOMEN LIMITED
1 series · 14 of 25 positions shown · non-contrast
Comparison: Prior CT from 03/11/2019.

CLINICAL DATA: Initial evaluation for elevated LFTs, abnormal CT.

EXAM:
ULTRASOUND ABDOMEN LIMITED RIGHT UPPER QUADRANT

[Series 1: us abdomen limited · 14 of 52 slices shown]
[im 1/52]
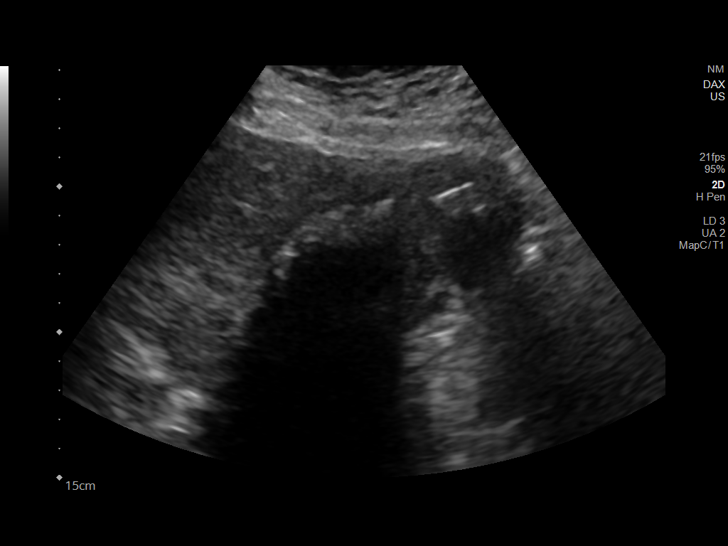
[im 5/52]
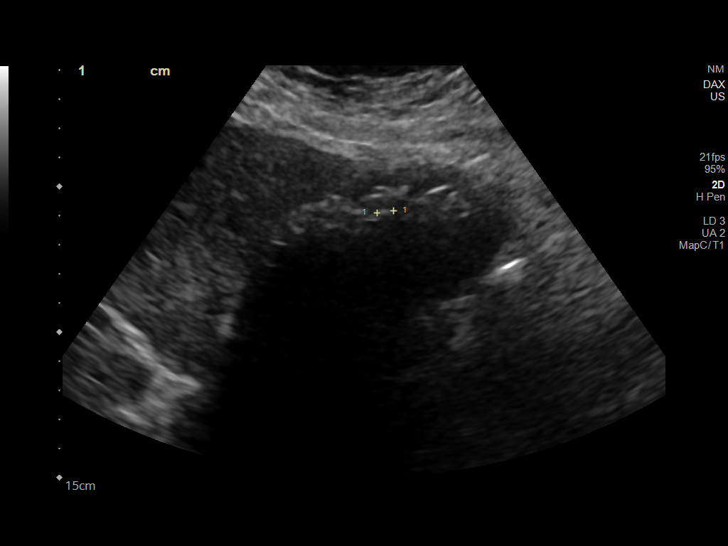
[im 9/52]
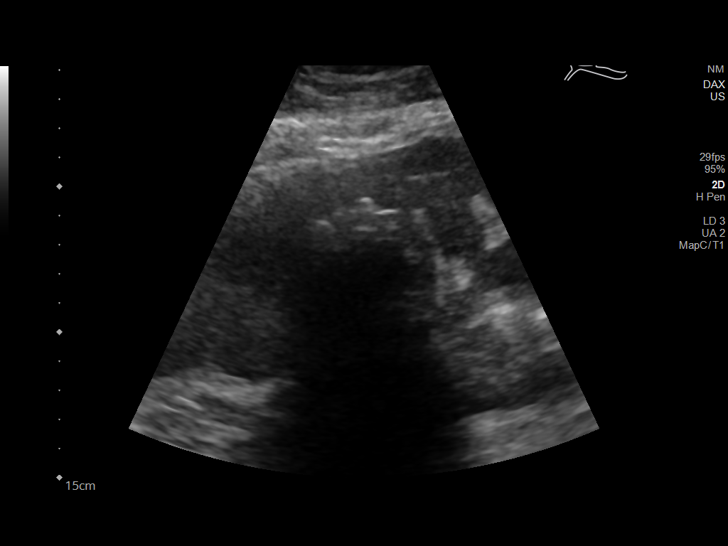
[im 13/52]
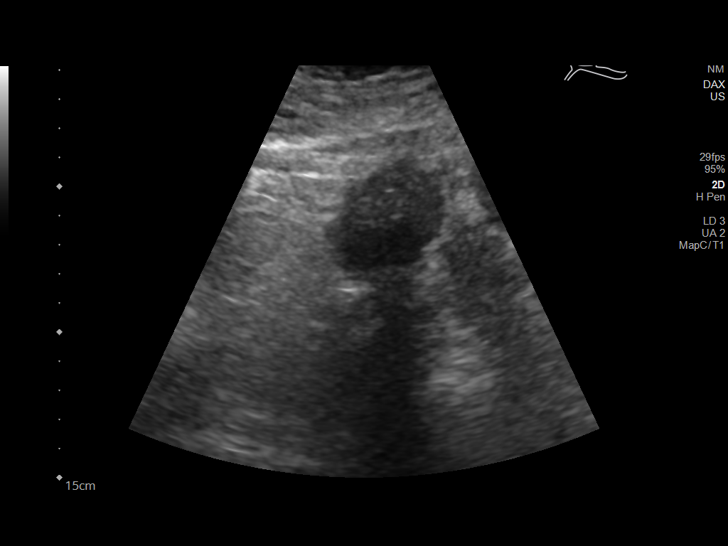
[im 18/52]
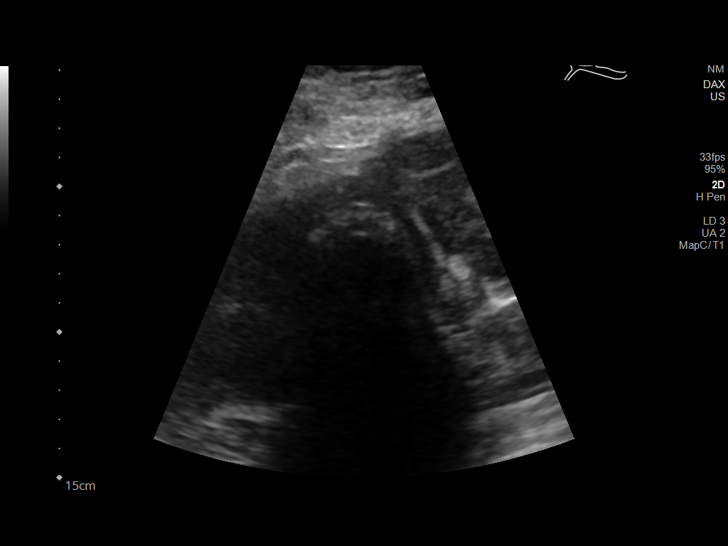
[im 20/52]
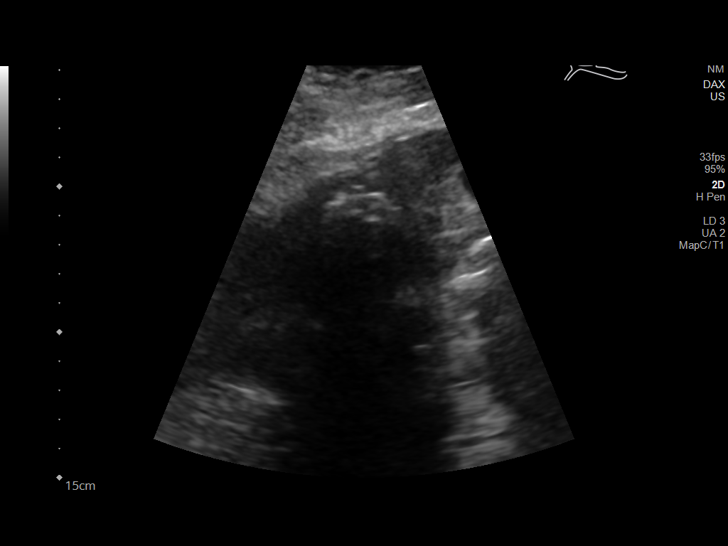
[im 24/52]
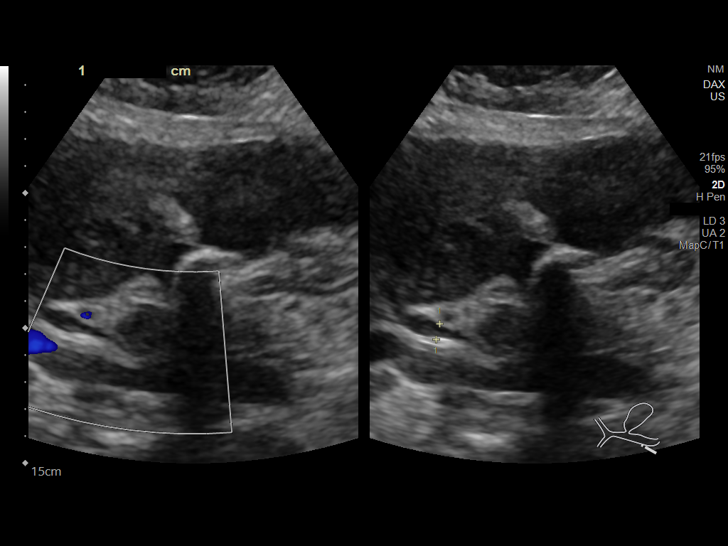
[im 28/52]
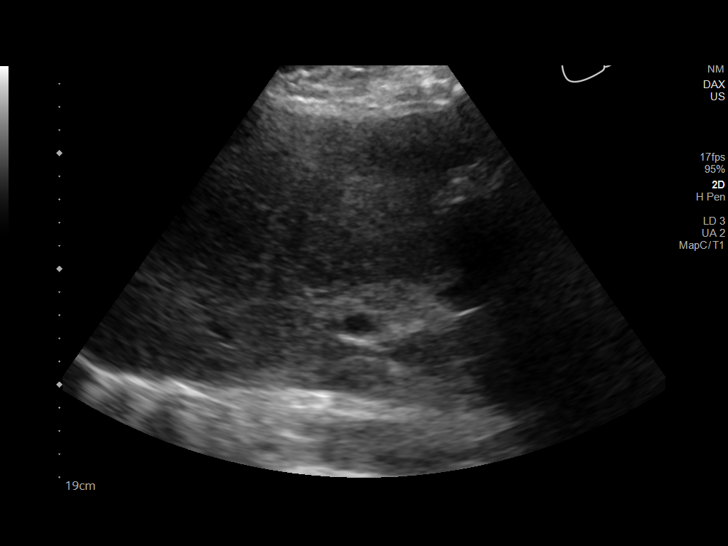
[im 32/52]
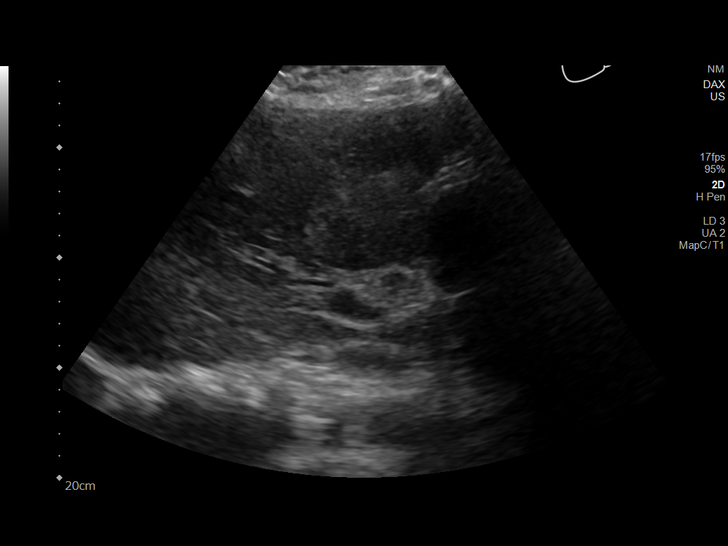
[im 35/52]
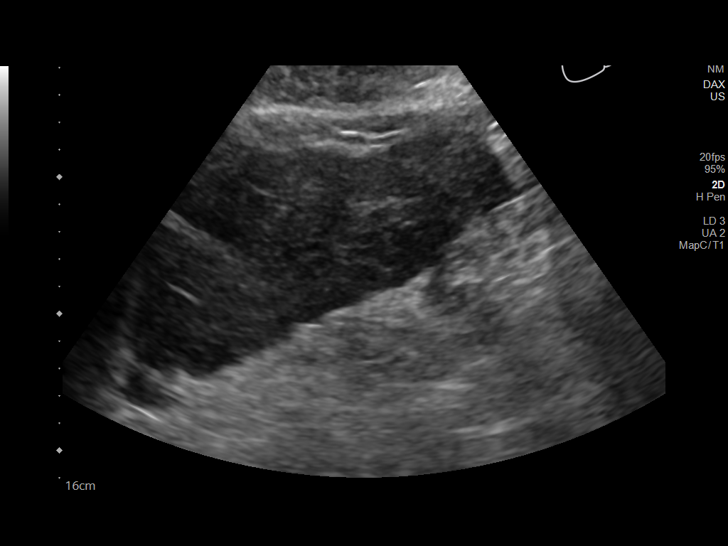
[im 39/52]
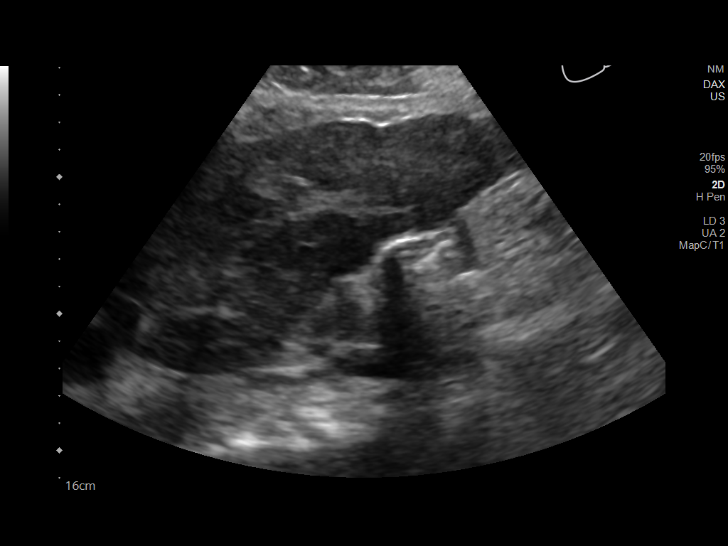
[im 43/52]
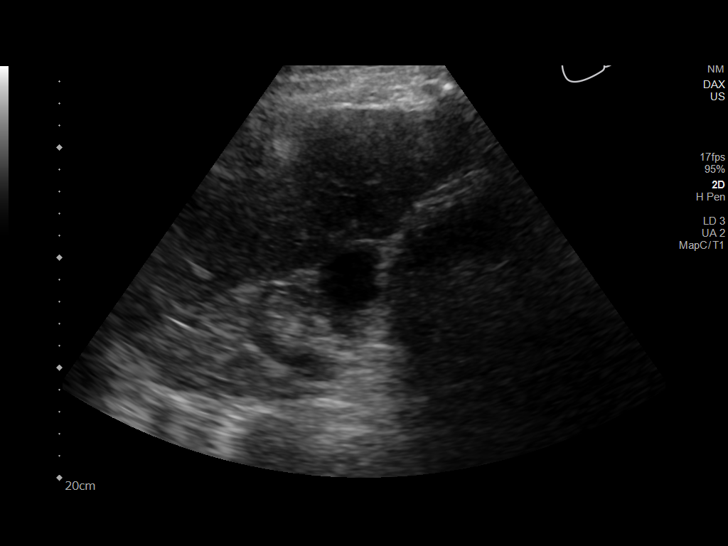
[im 47/52]
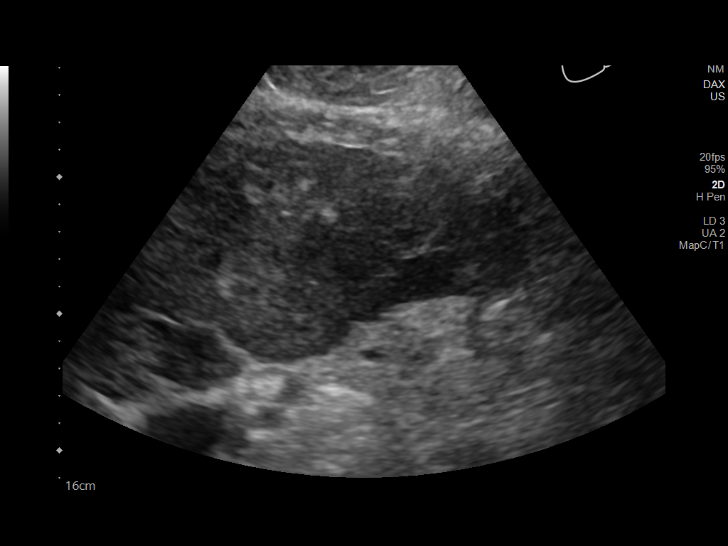
[im 52/52]
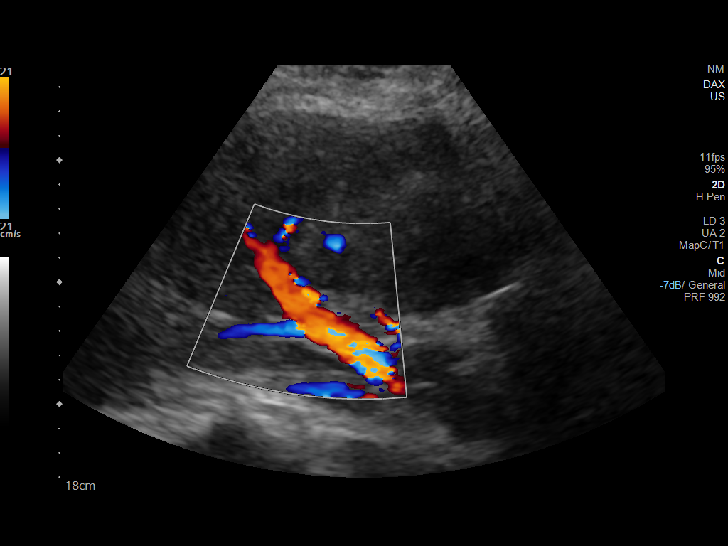

[14 of 25 positions shown; findings below may reference images not displayed]

FINDINGS: Gallbladder:

Gallbladder distended with multiple internal shadowing echogenic
stones, largest of which measures approximately 6 mm. Ailene Adame sign is
present. Gallbladder wall measures at the upper limits of normal at
3 mm in thickness. No free pericholecystic fluid. No sonographic
Murphy sign elicited on exam.

Common bile duct:

Diameter: 6 mm

Liver:

Liver demonstrates a heterogeneous echotexture with nodular contour,
consistent with cirrhosis. No focal intrahepatic mass identified by
sonography. Portal vein is patent on color Doppler imaging with
normal direction of blood flow towards the liver.

Other: Incidental note made of 2 small simple cysts within the right
kidney, also seen on prior CT.
IMPRESSION: 1. Distended gallbladder filled with stones. No other sonographic
features to suggest acute cholecystitis.
2. No biliary dilatation.
3. Morphologic changes consistent with cirrhosis. No discrete
intrahepatic mass evident by sonography.

## 2020-04-19 IMAGING — MR MR MRCP
11 of 13 series · 43 of 48 positions shown · non-contrast
Comparison: 03/11/2019 CT abdomen/pelvis.

CLINICAL DATA: Inpatient. Abnormal liver enzymes. Jaundice. Acute
renal failure. Suggestion of liver masses on recent noncontrast CT.

EXAM:
MRI ABDOMEN WITHOUT CONTRAST  (INCLUDING MRCP)
TECHNIQUE: Multiplanar multisequence MR imaging of the abdomen was performed.
Heavily T2-weighted images of the biliary and pancreatic ducts were
obtained, and three-dimensional MRCP images were rendered by post
processing.

[Series 3: ax haste · axial · 6.0mm · 1.19mm/px · z∈[-212,+69]mm · 3 of 40 slices shown]
[im 1/40]
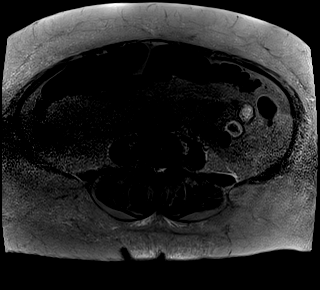
[im 20/40]
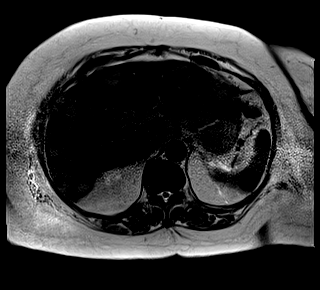
[im 40/40]
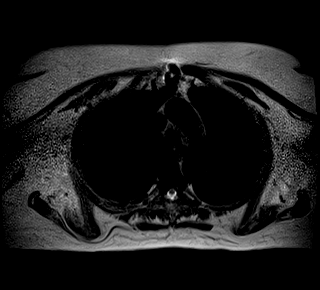

[Series 4: bSSFP · coronal · 6.0mm · 0.74mm/px · 2 of 40 slices shown]
[im 1/40]
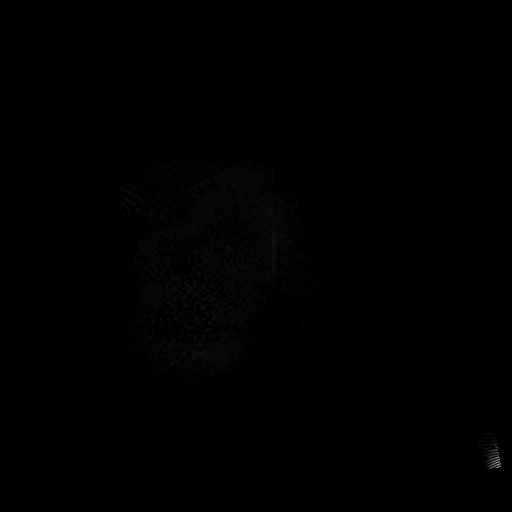
[im 40/40]
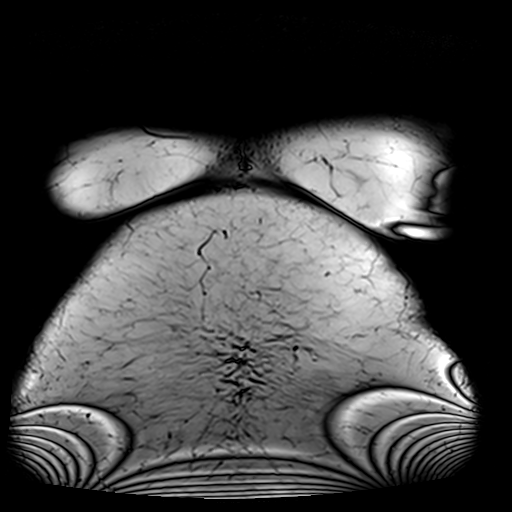

[Series 5: T2 fat-sat · axial · 6.0mm · 1.19mm/px · z∈[-212,+69]mm · 2 of 40 slices shown]
[im 1/40]
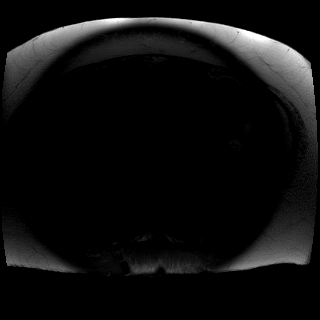
[im 40/40]
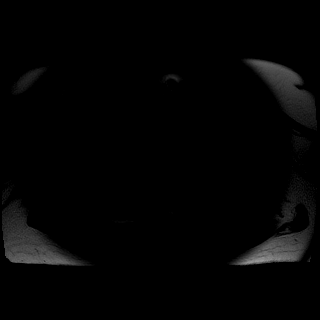

[Series 6: DWI · axial · 6.0mm · 1.42mm/px · z∈[-212,+69]mm · 7 of 120 slices shown (1 of 2)]
[im 1/120]
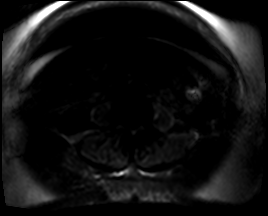
[im 20/120]
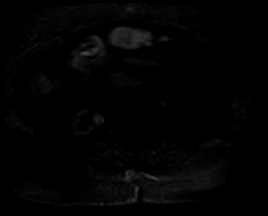
[im 40/120]
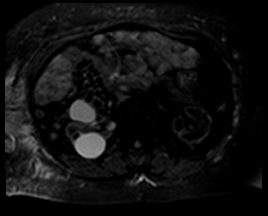
[im 60/120]
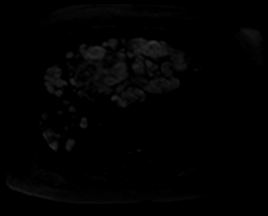
[im 80/120]
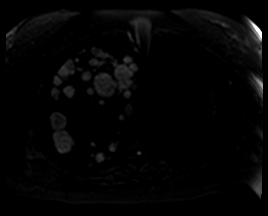
[im 100/120]
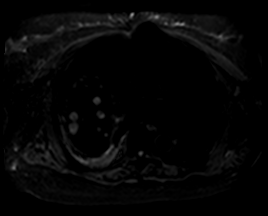
[im 120/120]
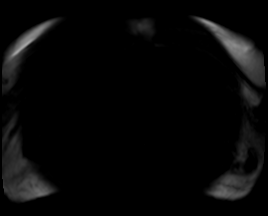

[Series 7: DWI · axial · 6.0mm · 1.42mm/px · z∈[-212,+69]mm · 2 of 40 slices shown (2 of 2)]
[im 1/40]
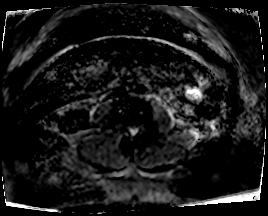
[im 40/40]
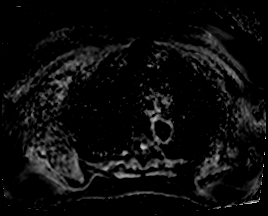

[Series 10: ax out of · axial · 3.0mm · 1.19mm/px · z∈[-188,+73]mm · 5 of 88 slices shown]
[im 1/88]
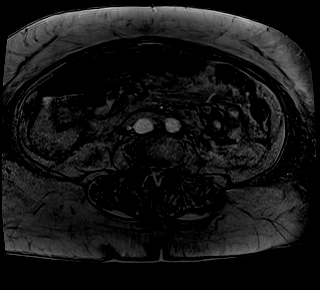
[im 22/88]
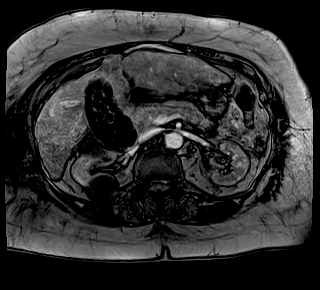
[im 44/88]
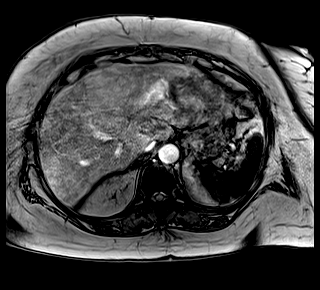
[im 66/88]
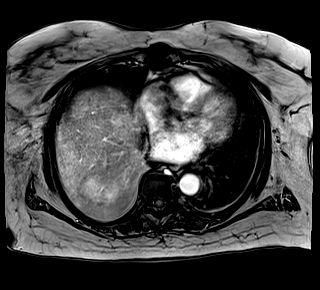
[im 88/88]
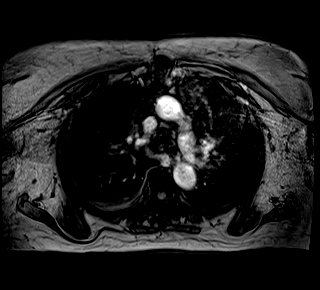

[Series 10: ax in and · axial · 3.0mm · 1.19mm/px · z∈[-188,+73]mm · 10 of 176 slices shown (1 of 2)]
[im 1/176]
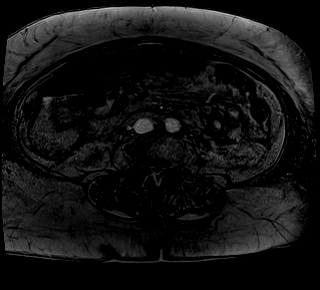
[im 20/176]
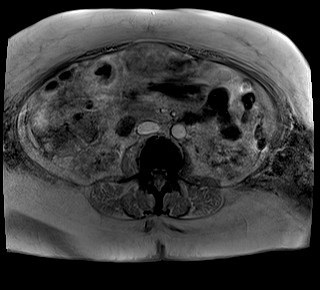
[im 39/176]
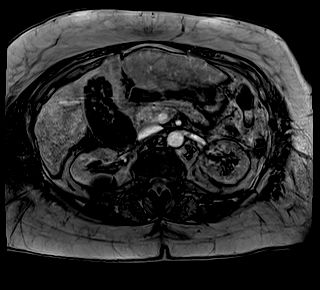
[im 59/176]
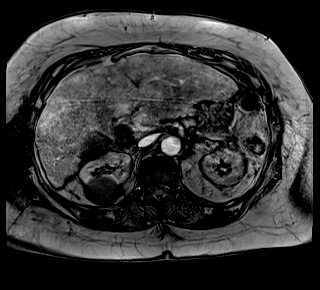
[im 78/176]
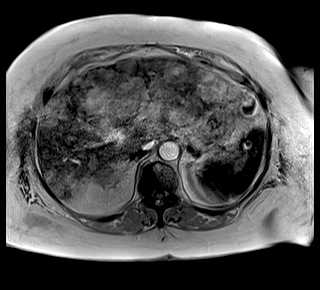
[im 98/176]
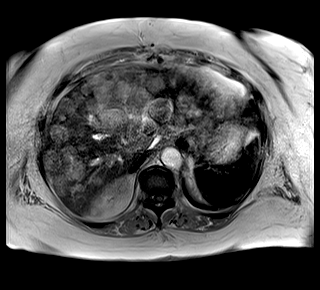
[im 117/176]
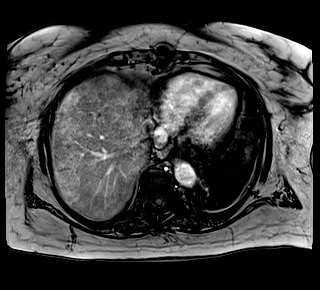
[im 137/176]
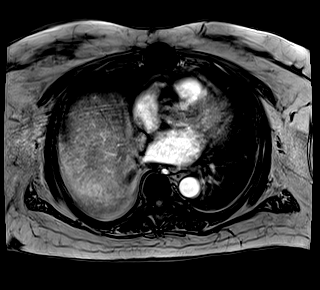
[im 156/176]
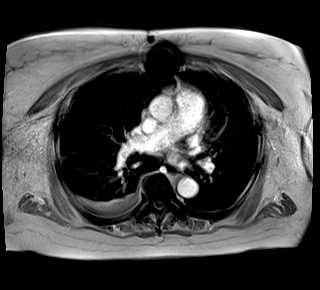
[im 176/176]
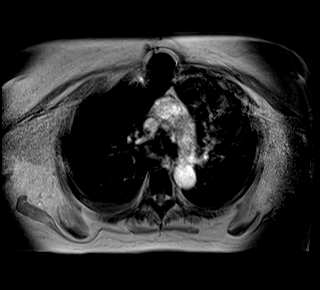

[Series 10: ax in and · axial · 3.0mm · 1.19mm/px · z∈[-188,+73]mm · 5 of 88 slices shown (2 of 2)]
[im 1/88]
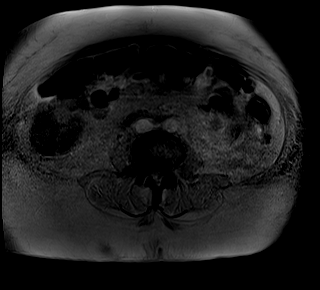
[im 22/88]
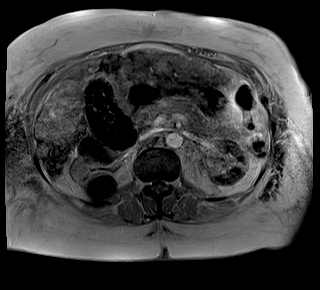
[im 44/88]
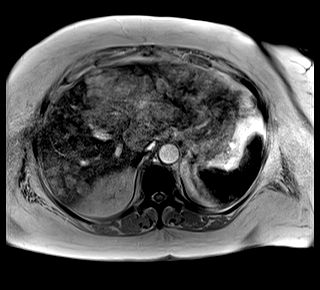
[im 66/88]
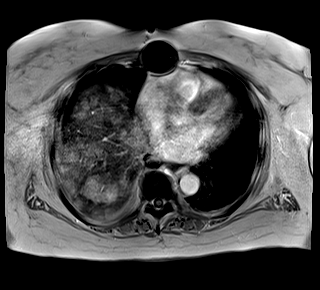
[im 88/88]
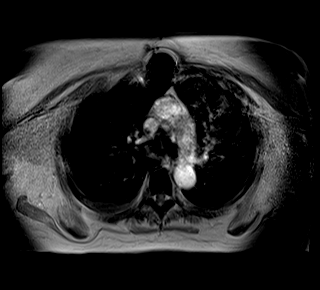

[Series 11: MRCP · coronal · 4.0mm · 1.19mm/px · 1 of 15 slices shown]
[im 1/15]
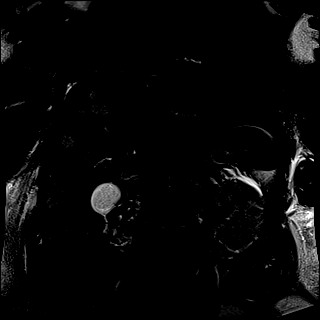

[Series 12: radials · oblique · 50.0mm · 0.91mm/px · 1 of 5 slices shown]
[im 1/5]
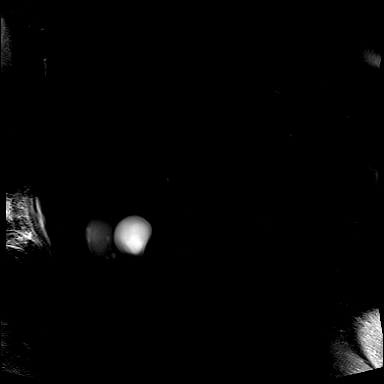

[Series 13: T1 dynamic · axial · non-contrast · 3.0mm · 1.19mm/px · z∈[-188,+73]mm · 5 of 88 slices shown]
[im 1/88]
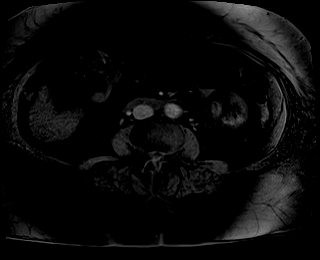
[im 22/88]
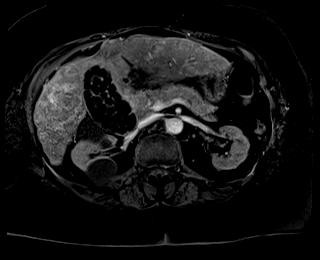
[im 44/88]
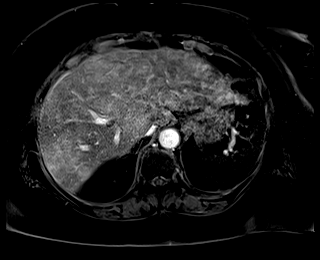
[im 66/88]
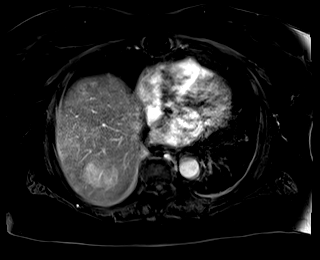
[im 88/88]
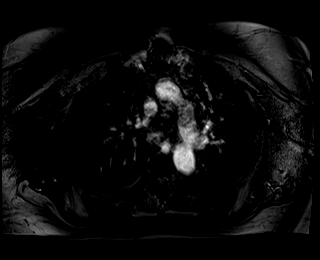

[43 of 48 positions shown; findings below may reference images not displayed]

FINDINGS: Lower chest: Small dependent right pleural effusion.

Hepatobiliary: Hepatomegaly. There are innumerable (> than 20) bulky
confluent similar mildly T2 hyperintense liver masses replacing much
of the liver parenchyma. Representative 7.4 x 4.5 cm inferior right
liver mass (series 5/image 29), 4.3 x 4.0 cm segment 7 right liver
mass (series 5/image 10) and 5.7 x 3.8 cm central left liver mass
(series 5/image 23). There is background iron deposition throughout
the liver parenchyma. Distended gallbladder is completely filled
with gallstones measuring up to 1.2 cm in size. Mild diffuse
gallbladder wall thickening. No significant pericholecystic fluid.
There is scattered mild intrahepatic biliary ductal dilatation
throughout the liver. Common bile duct diameter 7 mm, mildly dilated
proximal. No evidence of choledocholithiasis.

Pancreas: No pancreatic mass or duct dilation.  No pancreas divisum.

Spleen: Hemosiderosis in the spleen. No splenic mass. Normal size
spleen.

Adrenals/Urinary Tract: Normal adrenals. No hydronephrosis. Simple
appearing bilateral renal cysts, largest 4.5 cm in the posterior
interpolar right kidney, incompletely characterized on this
noncontrast MRI. No overtly suspicious renal masses. Abnormal T2
hyperintensity throughout the renal cortex bilaterally with abnormal
accentuation of the corticomedullary signal differentiation.

Stomach/Bowel: Normal non-distended stomach. Visualized small and
large bowel is normal caliber, with no bowel wall thickening.

Vascular/Lymphatic: Normal caliber abdominal aorta. No
pathologically enlarged lymph nodes in the abdomen.

Other: No abdominal ascites or focal fluid collection.

Musculoskeletal: Large T2 hyperintense vertebral lesions at L1 and
L3, compatible with vertebral hemangiomas in correlation with the
recent CT study.
IMPRESSION: 1. Innumerable bulky confluent liver masses replacing much of the
liver, most compatible with metastatic disease. A primary neoplasm
is not evident on this abdomen MRI study.
2. Distended gallbladder is completely filled with gallstones with
nonspecific mild diffuse gallbladder wall thickening. No
pericholecystic fluid. Acute cholecystitis cannot be excluded by
MRI.
3. Scattered mild intrahepatic biliary ductal dilatation throughout
the liver probably due to extrinsic mass effect on the central
intrahepatic bile ducts by the bulky liver masses. Mild proximal
dilatation of the common bile duct (7 mm diameter). No evidence of
choledocholithiasis.
4. Small dependent right pleural effusion.
5. Diffuse iron deposition in the liver and spleen, presumably
transfusion related.
